# Patient Record
Sex: Male | Born: 1945 | Race: White | Hispanic: No | Marital: Married | Smoking: Former smoker
Health system: Southern US, Community
[De-identification: ages and names within clinical notes are randomized; demographics above are authoritative.]

## PROBLEM LIST (undated history)

## (undated) DIAGNOSIS — M1990 Unspecified osteoarthritis, unspecified site: Secondary | ICD-10-CM

## (undated) DIAGNOSIS — K219 Gastro-esophageal reflux disease without esophagitis: Secondary | ICD-10-CM

## (undated) DIAGNOSIS — C3490 Malignant neoplasm of unspecified part of unspecified bronchus or lung: Secondary | ICD-10-CM

## (undated) HISTORY — PX: OTHER SURGICAL HISTORY: SHX169

## (undated) HISTORY — PX: ESOPHAGOGASTRODUODENOSCOPY: SHX1529

## (undated) HISTORY — PX: COLONOSCOPY: SHX174

---

## 2009-02-01 ENCOUNTER — Observation Stay: Payer: Self-pay | Admitting: Internal Medicine

## 2011-12-09 DIAGNOSIS — C3490 Malignant neoplasm of unspecified part of unspecified bronchus or lung: Secondary | ICD-10-CM

## 2011-12-09 HISTORY — DX: Malignant neoplasm of unspecified part of unspecified bronchus or lung: C34.90

## 2012-02-03 ENCOUNTER — Other Ambulatory Visit (HOSPITAL_COMMUNITY): Payer: Self-pay | Admitting: Dentistry

## 2012-03-24 ENCOUNTER — Ambulatory Visit (HOSPITAL_COMMUNITY): Payer: Self-pay | Admitting: Dentistry

## 2012-03-25 NOTE — Progress Notes (Signed)
Appointment cancelled

## 2013-01-30 ENCOUNTER — Observation Stay: Payer: Self-pay

## 2013-01-30 LAB — URINALYSIS, COMPLETE
Bacteria: NONE SEEN
Blood: NEGATIVE
Glucose,UR: NEGATIVE mg/dL (ref 0–75)
Leukocyte Esterase: NEGATIVE
Ph: 7 (ref 4.5–8.0)
Protein: NEGATIVE
RBC,UR: <1 /HPF (ref 0–5)
Squamous Epithelial: NONE SEEN

## 2013-01-30 LAB — COMPREHENSIVE METABOLIC PANEL
Albumin: 3.5 g/dL (ref 3.4–5.0)
BUN: 8 mg/dL (ref 7–18)
Calcium, Total: 8.9 mg/dL (ref 8.5–10.1)
Chloride: 103 mmol/L (ref 98–107)
Co2: 26 mmol/L (ref 21–32)
Creatinine: 0.78 mg/dL (ref 0.60–1.30)
EGFR (African American): >60
EGFR (Non-African Amer.): >60
Glucose: 95 mg/dL (ref 65–99)
Osmolality: 270 (ref 275–301)
Potassium: 3.9 mmol/L (ref 3.5–5.1)
SGOT(AST): 26 U/L (ref 15–37)
Sodium: 136 mmol/L (ref 136–145)
Total Protein: 7.1 g/dL (ref 6.4–8.2)

## 2013-01-30 LAB — TROPONIN I: Troponin-I: <0.02 ng/mL

## 2013-01-30 LAB — CK TOTAL AND CKMB (NOT AT ARMC)
CK, Total: 78 U/L (ref 35–232)
CK-MB: <0.5 ng/mL — ABNORMAL LOW (ref 0.5–3.6)
CK-MB: <0.5 ng/mL — ABNORMAL LOW (ref 0.5–3.6)

## 2013-01-30 LAB — CBC WITH DIFFERENTIAL/PLATELET
Basophil %: 0.8 %
Eosinophil %: 3.6 %
HCT: 38.8 % — ABNORMAL LOW (ref 40.0–52.0)
Lymphocyte #: 1.2 10*3/uL (ref 1.0–3.6)
Lymphocyte %: 13.5 %
MCH: 27.8 pg (ref 26.0–34.0)
MCV: 82 fL (ref 80–100)
Monocyte %: 7.8 %
RDW: 12.7 % (ref 11.5–14.5)

## 2013-01-30 LAB — PROTIME-INR
INR: 0.9
Prothrombin Time: 12.6 secs (ref 11.5–14.7)

## 2013-01-30 LAB — APTT: Activated PTT: 30.8 secs (ref 23.6–35.9)

## 2013-01-31 LAB — LIPID PANEL
HDL Cholesterol: 37 mg/dL — ABNORMAL LOW (ref 40–60)
Ldl Cholesterol, Calc: 130 mg/dL — ABNORMAL HIGH (ref 0–100)
Triglycerides: 80 mg/dL (ref 0–200)

## 2013-01-31 LAB — CBC WITH DIFFERENTIAL/PLATELET
Basophil #: 0.1 10*3/uL (ref 0.0–0.1)
Basophil %: 0.9 %
Eosinophil %: 3.9 %
Lymphocyte %: 13.5 %
MCHC: 34.1 g/dL (ref 32.0–36.0)
MCV: 82 fL (ref 80–100)
Monocyte #: 0.7 x10 3/mm (ref 0.2–1.0)
Monocyte %: 8.7 %
Neutrophil %: 73 %
Platelet: 453 10*3/uL — ABNORMAL HIGH (ref 150–440)
RDW: 12.9 % (ref 11.5–14.5)
WBC: 8.2 10*3/uL (ref 3.8–10.6)

## 2013-01-31 LAB — TROPONIN I
Troponin-I: <0.02 ng/mL
Troponin-I: <0.02 ng/mL

## 2013-01-31 LAB — COMPREHENSIVE METABOLIC PANEL
Albumin: 3.3 g/dL — ABNORMAL LOW (ref 3.4–5.0)
Anion Gap: 7 (ref 7–16)
Bilirubin,Total: 0.6 mg/dL (ref 0.2–1.0)
Co2: 26 mmol/L (ref 21–32)
Creatinine: 0.76 mg/dL (ref 0.60–1.30)
EGFR (African American): >60
Potassium: 3.8 mmol/L (ref 3.5–5.1)
SGPT (ALT): 14 U/L (ref 12–78)
Sodium: 139 mmol/L (ref 136–145)
Total Protein: 6.8 g/dL (ref 6.4–8.2)

## 2013-02-01 ENCOUNTER — Other Ambulatory Visit (HOSPITAL_COMMUNITY): Payer: Self-pay | Admitting: Cardiothoracic Surgery

## 2013-02-01 ENCOUNTER — Ambulatory Visit: Payer: Self-pay | Admitting: Oncology

## 2013-02-01 ENCOUNTER — Other Ambulatory Visit (HOSPITAL_COMMUNITY): Payer: Self-pay | Admitting: Oncology

## 2013-02-01 DIAGNOSIS — R918 Other nonspecific abnormal finding of lung field: Secondary | ICD-10-CM

## 2013-02-02 ENCOUNTER — Other Ambulatory Visit: Payer: Self-pay | Admitting: Radiology

## 2013-02-03 ENCOUNTER — Encounter (HOSPITAL_COMMUNITY): Payer: Self-pay | Admitting: Pharmacy Technician

## 2013-02-04 ENCOUNTER — Ambulatory Visit (HOSPITAL_COMMUNITY)
Admission: RE | Admit: 2013-02-04 | Discharge: 2013-02-04 | Disposition: A | Discharge status: Home / Self Care | Payer: Medicare Other | Source: Ambulatory Visit | Attending: Cardiothoracic Surgery | Admitting: Cardiothoracic Surgery

## 2013-02-04 ENCOUNTER — Encounter (HOSPITAL_COMMUNITY): Payer: Self-pay

## 2013-02-04 ENCOUNTER — Ambulatory Visit (HOSPITAL_COMMUNITY)
Admission: RE | Admit: 2013-02-04 | Discharge: 2013-02-04 | Disposition: A | Discharge status: Home / Self Care | Payer: Medicare Other | Source: Ambulatory Visit | Attending: Interventional Radiology | Admitting: Interventional Radiology

## 2013-02-04 DIAGNOSIS — C3410 Malignant neoplasm of upper lobe, unspecified bronchus or lung: Secondary | ICD-10-CM | POA: Insufficient documentation

## 2013-02-04 DIAGNOSIS — R42 Dizziness and giddiness: Secondary | ICD-10-CM | POA: Insufficient documentation

## 2013-02-04 DIAGNOSIS — Z8673 Personal history of transient ischemic attack (TIA), and cerebral infarction without residual deficits: Secondary | ICD-10-CM | POA: Insufficient documentation

## 2013-02-04 DIAGNOSIS — I10 Essential (primary) hypertension: Secondary | ICD-10-CM | POA: Insufficient documentation

## 2013-02-04 DIAGNOSIS — J449 Chronic obstructive pulmonary disease, unspecified: Secondary | ICD-10-CM | POA: Insufficient documentation

## 2013-02-04 DIAGNOSIS — R918 Other nonspecific abnormal finding of lung field: Secondary | ICD-10-CM

## 2013-02-04 DIAGNOSIS — J4489 Other specified chronic obstructive pulmonary disease: Secondary | ICD-10-CM | POA: Insufficient documentation

## 2013-02-04 DIAGNOSIS — E119 Type 2 diabetes mellitus without complications: Secondary | ICD-10-CM | POA: Insufficient documentation

## 2013-02-04 DIAGNOSIS — R55 Syncope and collapse: Secondary | ICD-10-CM | POA: Insufficient documentation

## 2013-02-04 DIAGNOSIS — Z79899 Other long term (current) drug therapy: Secondary | ICD-10-CM | POA: Insufficient documentation

## 2013-02-04 LAB — APTT: aPTT: 37 seconds (ref 24–37)

## 2013-02-04 LAB — CBC
Hemoglobin: 12.8 g/dL — ABNORMAL LOW (ref 13.0–17.0)
MCHC: 34 g/dL (ref 30.0–36.0)
RDW: 12.4 % (ref 11.5–15.5)
WBC: 9 10*3/uL (ref 4.0–10.5)

## 2013-02-04 LAB — PROTIME-INR
INR: 0.96 (ref 0.00–1.49)
Prothrombin Time: 12.7 seconds (ref 11.6–15.2)

## 2013-02-04 MED ORDER — MIDAZOLAM HCL 2 MG/2ML IJ SOLN
INTRAMUSCULAR | Status: AC | PRN
  Administered 2013-02-04: 0.5 mg via INTRAVENOUS
  Administered 2013-02-04: 1 mg via INTRAVENOUS
  Administered 2013-02-04: 0.5 mg via INTRAVENOUS

## 2013-02-04 MED ORDER — ACETAMINOPHEN 325 MG PO TABS
650.0000 mg | ORAL_TABLET | Freq: Four times a day (QID) | ORAL | Status: DC | PRN
  Administered 2013-02-04: 650 mg via ORAL
  Filled 2013-02-04 (×2): qty 2

## 2013-02-04 MED ORDER — MIDAZOLAM HCL 2 MG/2ML IJ SOLN
INTRAMUSCULAR | Status: AC
  Filled 2013-02-04: qty 6

## 2013-02-04 MED ORDER — FENTANYL CITRATE 0.05 MG/ML IJ SOLN
INTRAMUSCULAR | Status: AC | PRN
  Administered 2013-02-04 (×2): 50 ug via INTRAVENOUS

## 2013-02-04 MED ORDER — FENTANYL CITRATE 0.05 MG/ML IJ SOLN
INTRAMUSCULAR | Status: AC
  Filled 2013-02-04: qty 6

## 2013-02-04 MED ORDER — SODIUM CHLORIDE 0.9 % IV SOLN
INTRAVENOUS | Status: DC
  Administered 2013-02-04: 10 mL/h via INTRAVENOUS

## 2013-02-04 NOTE — H&P (Signed)
Agree.  For left lung mass biopsy today.

## 2013-02-04 NOTE — Procedures (Signed)
Procedure:  CT guided left lung biopsy Findings:  CT guided 18 G core biopsy x 3 via 17 G needle of large LUL lung mass.  No PTX.
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
## 2013-02-05 ENCOUNTER — Ambulatory Visit: Payer: Self-pay | Admitting: Oncology

## 2013-02-14 ENCOUNTER — Ambulatory Visit: Payer: Self-pay | Admitting: Vascular Surgery

## 2013-02-17 LAB — COMPREHENSIVE METABOLIC PANEL
Albumin: 3.4 g/dL (ref 3.4–5.0)
BUN: 10 mg/dL (ref 7–18)
Bilirubin,Total: 0.3 mg/dL (ref 0.2–1.0)
Chloride: 98 mmol/L (ref 98–107)
Co2: 29 mmol/L (ref 21–32)
Creatinine: 1.2 mg/dL (ref 0.60–1.30)
EGFR (Non-African Amer.): >60
Glucose: 109 mg/dL — ABNORMAL HIGH (ref 65–99)
Potassium: 4 mmol/L (ref 3.5–5.1)
Sodium: 135 mmol/L — ABNORMAL LOW (ref 136–145)
Total Protein: 7.4 g/dL (ref 6.4–8.2)

## 2013-02-17 LAB — CBC CANCER CENTER
Basophil #: 0.1 x10 3/mm (ref 0.0–0.1)
Basophil %: 0.8 %
Eosinophil %: 4 %
HGB: 12.8 g/dL — ABNORMAL LOW (ref 13.0–18.0)
MCH: 27.5 pg (ref 26.0–34.0)
MCHC: 34 g/dL (ref 32.0–36.0)
MCV: 81 fL (ref 80–100)
Neutrophil #: 6.6 x10 3/mm — ABNORMAL HIGH (ref 1.4–6.5)
Neutrophil %: 74.6 %
Platelet: 464 x10 3/mm — ABNORMAL HIGH (ref 150–440)
RBC: 4.67 10*6/uL (ref 4.40–5.90)

## 2013-02-24 LAB — COMPREHENSIVE METABOLIC PANEL
Alkaline Phosphatase: 114 U/L (ref 50–136)
Anion Gap: 9 (ref 7–16)
BUN: 10 mg/dL (ref 7–18)
Chloride: 98 mmol/L (ref 98–107)
Co2: 28 mmol/L (ref 21–32)
Creatinine: 1.01 mg/dL (ref 0.60–1.30)
EGFR (African American): >60
EGFR (Non-African Amer.): >60
Osmolality: 270 (ref 275–301)
Potassium: 4.2 mmol/L (ref 3.5–5.1)
SGPT (ALT): 28 U/L (ref 12–78)

## 2013-02-24 LAB — CBC CANCER CENTER
Basophil %: 0.9 %
Eosinophil #: 0.4 x10 3/mm (ref 0.0–0.7)
HGB: 12.7 g/dL — ABNORMAL LOW (ref 13.0–18.0)
Lymphocyte #: 1.1 x10 3/mm (ref 1.0–3.6)
Lymphocyte %: 13.1 %
MCHC: 33.4 g/dL (ref 32.0–36.0)
MCV: 82 fL (ref 80–100)
Monocyte #: 0.5 x10 3/mm (ref 0.2–1.0)
Monocyte %: 6.1 %
Neutrophil %: 75.3 %
Platelet: 473 x10 3/mm — ABNORMAL HIGH (ref 150–440)
RBC: 4.67 10*6/uL (ref 4.40–5.90)
RDW: 12.6 % (ref 11.5–14.5)

## 2013-03-03 LAB — COMPREHENSIVE METABOLIC PANEL
Albumin: 3.3 g/dL — ABNORMAL LOW (ref 3.4–5.0)
Alkaline Phosphatase: 101 U/L (ref 50–136)
Anion Gap: 7 (ref 7–16)
BUN: 11 mg/dL (ref 7–18)
Bilirubin,Total: 0.4 mg/dL (ref 0.2–1.0)
Calcium, Total: 8.6 mg/dL (ref 8.5–10.1)
Chloride: 101 mmol/L (ref 98–107)
Co2: 27 mmol/L (ref 21–32)
Creatinine: 1.02 mg/dL (ref 0.60–1.30)
EGFR (African American): >60
EGFR (Non-African Amer.): >60
Glucose: 124 mg/dL — ABNORMAL HIGH (ref 65–99)
Osmolality: 271 (ref 275–301)
Potassium: 3.6 mmol/L (ref 3.5–5.1)
SGOT(AST): 19 U/L (ref 15–37)
SGPT (ALT): 28 U/L (ref 12–78)
Sodium: 135 mmol/L — ABNORMAL LOW (ref 136–145)
Total Protein: 6.6 g/dL (ref 6.4–8.2)

## 2013-03-03 LAB — CBC CANCER CENTER
Basophil #: 0.1 x10 3/mm (ref 0.0–0.1)
Basophil %: 1 %
Eosinophil #: 0.1 x10 3/mm (ref 0.0–0.7)
Eosinophil %: 2.4 %
HCT: 34.6 % — ABNORMAL LOW (ref 40.0–52.0)
HGB: 11.9 g/dL — ABNORMAL LOW (ref 13.0–18.0)
Lymphocyte #: 0.8 x10 3/mm — ABNORMAL LOW (ref 1.0–3.6)
Lymphocyte %: 13.1 %
MCH: 27.9 pg (ref 26.0–34.0)
MCHC: 34.4 g/dL (ref 32.0–36.0)
MCV: 81 fL (ref 80–100)
Monocyte #: 0.4 x10 3/mm (ref 0.2–1.0)
Monocyte %: 6.4 %
Neutrophil #: 4.7 x10 3/mm (ref 1.4–6.5)
Neutrophil %: 77.1 %
Platelet: 386 x10 3/mm (ref 150–440)
RBC: 4.26 10*6/uL — ABNORMAL LOW (ref 4.40–5.90)
RDW: 12.9 % (ref 11.5–14.5)
WBC: 6 x10 3/mm (ref 3.8–10.6)

## 2013-03-08 ENCOUNTER — Ambulatory Visit: Payer: Self-pay | Admitting: Oncology

## 2013-03-10 LAB — COMPREHENSIVE METABOLIC PANEL
Albumin: 3.3 g/dL — ABNORMAL LOW (ref 3.4–5.0)
Anion Gap: 9 (ref 7–16)
BUN: 9 mg/dL (ref 7–18)
Chloride: 99 mmol/L (ref 98–107)
Co2: 28 mmol/L (ref 21–32)
EGFR (Non-African Amer.): >60
Glucose: 109 mg/dL — ABNORMAL HIGH (ref 65–99)
Osmolality: 271 (ref 275–301)
Potassium: 4 mmol/L (ref 3.5–5.1)
SGOT(AST): 22 U/L (ref 15–37)
Sodium: 136 mmol/L (ref 136–145)
Total Protein: 6.6 g/dL (ref 6.4–8.2)

## 2013-03-10 LAB — CBC CANCER CENTER
Basophil #: 0 x10 3/mm (ref 0.0–0.1)
Eosinophil #: 0.1 x10 3/mm (ref 0.0–0.7)
HGB: 11.8 g/dL — ABNORMAL LOW (ref 13.0–18.0)
Lymphocyte #: 0.5 x10 3/mm — ABNORMAL LOW (ref 1.0–3.6)
MCHC: 33 g/dL (ref 32.0–36.0)
MCV: 83 fL (ref 80–100)
Neutrophil #: 2.6 x10 3/mm (ref 1.4–6.5)
Neutrophil %: 73.8 %
Platelet: 360 x10 3/mm (ref 150–440)
RBC: 4.33 10*6/uL — ABNORMAL LOW (ref 4.40–5.90)
RDW: 13.5 % (ref 11.5–14.5)
WBC: 3.5 x10 3/mm — ABNORMAL LOW (ref 3.8–10.6)

## 2013-03-17 LAB — COMPREHENSIVE METABOLIC PANEL
Alkaline Phosphatase: 114 U/L (ref 50–136)
Anion Gap: 6 — ABNORMAL LOW (ref 7–16)
BUN: 11 mg/dL (ref 7–18)
Bilirubin,Total: 0.1 mg/dL — ABNORMAL LOW (ref 0.2–1.0)
Calcium, Total: 8.8 mg/dL (ref 8.5–10.1)
Chloride: 100 mmol/L (ref 98–107)
Co2: 29 mmol/L (ref 21–32)
Creatinine: 1.05 mg/dL (ref 0.60–1.30)
EGFR (Non-African Amer.): >60
Glucose: 104 mg/dL — ABNORMAL HIGH (ref 65–99)
Potassium: 4.2 mmol/L (ref 3.5–5.1)
SGOT(AST): 15 U/L (ref 15–37)

## 2013-03-17 LAB — CBC CANCER CENTER
Basophil #: 0 x10 3/mm (ref 0.0–0.1)
Basophil %: 1.3 %
Eosinophil #: 0.1 x10 3/mm (ref 0.0–0.7)
Eosinophil %: 2.2 %
HCT: 35.7 % — ABNORMAL LOW (ref 40.0–52.0)
HGB: 12 g/dL — ABNORMAL LOW (ref 13.0–18.0)
Lymphocyte #: 0.4 x10 3/mm — ABNORMAL LOW (ref 1.0–3.6)
MCH: 27.4 pg (ref 26.0–34.0)
MCHC: 33.5 g/dL (ref 32.0–36.0)
MCV: 82 fL (ref 80–100)
Monocyte #: 0.3 x10 3/mm (ref 0.2–1.0)
Neutrophil #: 2.7 x10 3/mm (ref 1.4–6.5)
Neutrophil %: 76.6 %
Platelet: 287 x10 3/mm (ref 150–440)
WBC: 3.5 x10 3/mm — ABNORMAL LOW (ref 3.8–10.6)

## 2013-03-24 LAB — CBC CANCER CENTER
Basophil #: 0 x10 3/mm (ref 0.0–0.1)
Eosinophil #: 0.1 x10 3/mm (ref 0.0–0.7)
Eosinophil %: 4.2 %
HCT: 33.4 % — ABNORMAL LOW (ref 40.0–52.0)
HGB: 11.5 g/dL — ABNORMAL LOW (ref 13.0–18.0)
Lymphocyte #: 0.3 x10 3/mm — ABNORMAL LOW (ref 1.0–3.6)
Lymphocyte %: 8.3 %
MCH: 28.2 pg (ref 26.0–34.0)
MCHC: 34.3 g/dL (ref 32.0–36.0)
Monocyte #: 0.3 x10 3/mm (ref 0.2–1.0)
Monocyte %: 8.6 %
Neutrophil #: 2.6 x10 3/mm (ref 1.4–6.5)
Neutrophil %: 77.8 %
RBC: 4.07 10*6/uL — ABNORMAL LOW (ref 4.40–5.90)
RDW: 14.3 % (ref 11.5–14.5)

## 2013-03-24 LAB — COMPREHENSIVE METABOLIC PANEL
Albumin: 3.5 g/dL (ref 3.4–5.0)
Alkaline Phosphatase: 98 U/L (ref 50–136)
Anion Gap: 8 (ref 7–16)
Bilirubin,Total: 0.4 mg/dL (ref 0.2–1.0)
Chloride: 101 mmol/L (ref 98–107)
Co2: 29 mmol/L (ref 21–32)
EGFR (African American): >60
Potassium: 4.1 mmol/L (ref 3.5–5.1)
SGPT (ALT): 25 U/L (ref 12–78)

## 2013-03-31 LAB — CBC CANCER CENTER
HGB: 11.5 g/dL — ABNORMAL LOW (ref 13.0–18.0)
Lymphocyte %: 12.8 %
MCH: 28.3 pg (ref 26.0–34.0)
MCHC: 33.9 g/dL (ref 32.0–36.0)
Monocyte #: 0.3 x10 3/mm (ref 0.2–1.0)
Platelet: 184 x10 3/mm (ref 150–440)

## 2013-03-31 LAB — COMPREHENSIVE METABOLIC PANEL
Anion Gap: 4 — ABNORMAL LOW (ref 7–16)
Bilirubin,Total: 0.4 mg/dL (ref 0.2–1.0)
Creatinine: 0.83 mg/dL (ref 0.60–1.30)
EGFR (African American): >60
EGFR (Non-African Amer.): >60
Glucose: 97 mg/dL (ref 65–99)
Osmolality: 269 (ref 275–301)
Potassium: 4 mmol/L (ref 3.5–5.1)
SGOT(AST): 16 U/L (ref 15–37)
SGPT (ALT): 21 U/L (ref 12–78)
Sodium: 135 mmol/L — ABNORMAL LOW (ref 136–145)
Total Protein: 6.5 g/dL (ref 6.4–8.2)

## 2013-04-07 ENCOUNTER — Ambulatory Visit: Payer: Self-pay | Admitting: Oncology

## 2013-04-07 LAB — CBC CANCER CENTER
Basophil #: 0 x10 3/mm (ref 0.0–0.1)
Basophil %: 0.6 %
HCT: 32.9 % — ABNORMAL LOW (ref 40.0–52.0)
HGB: 11.1 g/dL — ABNORMAL LOW (ref 13.0–18.0)
Lymphocyte #: 0.3 x10 3/mm — ABNORMAL LOW (ref 1.0–3.6)
MCH: 28.2 pg (ref 26.0–34.0)
MCHC: 33.7 g/dL (ref 32.0–36.0)
MCV: 84 fL (ref 80–100)
Monocyte #: 0.3 x10 3/mm (ref 0.2–1.0)
Platelet: 254 x10 3/mm (ref 150–440)
RBC: 3.92 10*6/uL — ABNORMAL LOW (ref 4.40–5.90)
RDW: 17.4 % — ABNORMAL HIGH (ref 11.5–14.5)
WBC: 2.3 x10 3/mm — ABNORMAL LOW (ref 3.8–10.6)

## 2013-04-07 LAB — COMPREHENSIVE METABOLIC PANEL
Albumin: 3.4 g/dL (ref 3.4–5.0)
Anion Gap: 14 (ref 7–16)
Bilirubin,Total: 0.4 mg/dL (ref 0.2–1.0)
Calcium, Total: 9.1 mg/dL (ref 8.5–10.1)
EGFR (African American): >60
EGFR (Non-African Amer.): >60
Glucose: 93 mg/dL (ref 65–99)
Potassium: 4.1 mmol/L (ref 3.5–5.1)
SGOT(AST): 13 U/L — ABNORMAL LOW (ref 15–37)
Sodium: 137 mmol/L (ref 136–145)

## 2013-04-28 ENCOUNTER — Emergency Department: Payer: Self-pay | Admitting: Emergency Medicine

## 2013-04-28 LAB — URINALYSIS, COMPLETE
Bacteria: NONE SEEN
Bilirubin,UR: NEGATIVE
Blood: NEGATIVE
Glucose,UR: NEGATIVE mg/dL (ref 0–75)
Ketone: NEGATIVE
Nitrite: NEGATIVE
Protein: NEGATIVE
RBC,UR: NONE SEEN /HPF (ref 0–5)
Specific Gravity: 1.006 (ref 1.003–1.030)
WBC UR: 1 /HPF (ref 0–5)

## 2013-04-28 LAB — COMPREHENSIVE METABOLIC PANEL
Albumin: 3.1 g/dL — ABNORMAL LOW (ref 3.4–5.0)
Alkaline Phosphatase: 109 U/L (ref 50–136)
Anion Gap: 5 — ABNORMAL LOW (ref 7–16)
BUN: 7 mg/dL (ref 7–18)
Bilirubin,Total: 0.4 mg/dL (ref 0.2–1.0)
Chloride: 101 mmol/L (ref 98–107)
Co2: 27 mmol/L (ref 21–32)
EGFR (African American): >60
EGFR (Non-African Amer.): >60
Glucose: 153 mg/dL — ABNORMAL HIGH (ref 65–99)
Osmolality: 267 (ref 275–301)
Potassium: 3.6 mmol/L (ref 3.5–5.1)
SGPT (ALT): 32 U/L (ref 12–78)
Total Protein: 7 g/dL (ref 6.4–8.2)

## 2013-04-28 LAB — CBC WITH DIFFERENTIAL/PLATELET
Basophil #: 0.1 10*3/uL (ref 0.0–0.1)
Basophil %: 1 %
Eosinophil #: 0.4 10*3/uL (ref 0.0–0.7)
HCT: 30.7 % — ABNORMAL LOW (ref 40.0–52.0)
Lymphocyte #: 0.5 10*3/uL — ABNORMAL LOW (ref 1.0–3.6)
Lymphocyte %: 8.8 %
MCH: 29.8 pg (ref 26.0–34.0)
MCHC: 35 g/dL (ref 32.0–36.0)
MCV: 85 fL (ref 80–100)
Monocyte #: 1.1 x10 3/mm — ABNORMAL HIGH (ref 0.2–1.0)
Monocyte %: 18.2 %
Neutrophil #: 3.8 10*3/uL (ref 1.4–6.5)
Neutrophil %: 65.9 %
Platelet: 307 10*3/uL (ref 150–440)
RDW: 20.2 % — ABNORMAL HIGH (ref 11.5–14.5)

## 2013-05-03 LAB — CULTURE, BLOOD (SINGLE)

## 2013-05-08 ENCOUNTER — Ambulatory Visit: Payer: Self-pay | Admitting: Oncology

## 2013-05-23 LAB — CBC CANCER CENTER
Basophil %: 0.7 %
HCT: 32.9 % — ABNORMAL LOW (ref 40.0–52.0)
HGB: 11.2 g/dL — ABNORMAL LOW (ref 13.0–18.0)
Lymphocyte %: 7.9 %
MCHC: 34.1 g/dL (ref 32.0–36.0)
MCV: 87 fL (ref 80–100)
Monocyte %: 13.1 %
Neutrophil #: 3.4 x10 3/mm (ref 1.4–6.5)
Platelet: 350 x10 3/mm (ref 150–440)
RDW: 16.4 % — ABNORMAL HIGH (ref 11.5–14.5)
WBC: 4.8 x10 3/mm (ref 3.8–10.6)

## 2013-05-23 LAB — COMPREHENSIVE METABOLIC PANEL
Albumin: 3 g/dL — ABNORMAL LOW (ref 3.4–5.0)
Anion Gap: 6 — ABNORMAL LOW (ref 7–16)
BUN: 11 mg/dL (ref 7–18)
Chloride: 100 mmol/L (ref 98–107)
EGFR (Non-African Amer.): >60
Potassium: 3.6 mmol/L (ref 3.5–5.1)
SGOT(AST): 14 U/L — ABNORMAL LOW (ref 15–37)
Sodium: 135 mmol/L — ABNORMAL LOW (ref 136–145)

## 2013-06-07 ENCOUNTER — Ambulatory Visit: Payer: Self-pay | Admitting: Oncology

## 2013-07-04 LAB — COMPREHENSIVE METABOLIC PANEL
Alkaline Phosphatase: 96 U/L (ref 50–136)
BUN: 6 mg/dL — ABNORMAL LOW (ref 7–18)
Chloride: 105 mmol/L (ref 98–107)
Co2: 30 mmol/L (ref 21–32)
Creatinine: 0.89 mg/dL (ref 0.60–1.30)
EGFR (African American): >60
Glucose: 86 mg/dL (ref 65–99)
SGOT(AST): 18 U/L (ref 15–37)
SGPT (ALT): 22 U/L (ref 12–78)
Sodium: 140 mmol/L (ref 136–145)

## 2013-07-04 LAB — CBC CANCER CENTER
Basophil #: 0 x10 3/mm (ref 0.0–0.1)
Basophil %: 0.7 %
Eosinophil %: 9 %
Lymphocyte #: 0.5 x10 3/mm — ABNORMAL LOW (ref 1.0–3.6)
MCH: 28.5 pg (ref 26.0–34.0)
MCV: 83 fL (ref 80–100)
Monocyte %: 12 %
WBC: 3.9 x10 3/mm (ref 3.8–10.6)

## 2013-07-08 ENCOUNTER — Ambulatory Visit: Payer: Self-pay | Admitting: Oncology

## 2013-08-05 LAB — COMPREHENSIVE METABOLIC PANEL
Alkaline Phosphatase: 95 U/L (ref 50–136)
Anion Gap: 10 (ref 7–16)
Bilirubin,Total: 0.8 mg/dL (ref 0.2–1.0)
Chloride: 101 mmol/L (ref 98–107)
Co2: 28 mmol/L (ref 21–32)
Creatinine: 0.94 mg/dL (ref 0.60–1.30)
EGFR (African American): >60
EGFR (Non-African Amer.): >60
Glucose: 107 mg/dL — ABNORMAL HIGH (ref 65–99)
Osmolality: 276 (ref 275–301)
Sodium: 139 mmol/L (ref 136–145)
Total Protein: 6.5 g/dL (ref 6.4–8.2)

## 2013-08-05 LAB — CBC CANCER CENTER
Basophil #: 0.1 x10 3/mm (ref 0.0–0.1)
HCT: 40.5 % (ref 40.0–52.0)
MCH: 27.2 pg (ref 26.0–34.0)
MCV: 81 fL (ref 80–100)
Monocyte %: 9.1 %
Neutrophil %: 65.7 %
RBC: 5 10*6/uL (ref 4.40–5.90)

## 2013-08-08 ENCOUNTER — Ambulatory Visit: Payer: Self-pay | Admitting: Oncology

## 2013-09-05 LAB — CBC CANCER CENTER
Basophil %: 0.8 %
HGB: 14.7 g/dL (ref 13.0–18.0)
Lymphocyte #: 0.6 x10 3/mm — ABNORMAL LOW (ref 1.0–3.6)
Lymphocyte %: 9.8 %
MCV: 81 fL (ref 80–100)
WBC: 6.5 x10 3/mm (ref 3.8–10.6)

## 2013-09-05 LAB — COMPREHENSIVE METABOLIC PANEL
Alkaline Phosphatase: 93 U/L (ref 50–136)
Calcium, Total: 9.2 mg/dL (ref 8.5–10.1)
Chloride: 103 mmol/L (ref 98–107)
Co2: 32 mmol/L (ref 21–32)
EGFR (Non-African Amer.): >60
Glucose: 99 mg/dL (ref 65–99)
Sodium: 137 mmol/L (ref 136–145)
Total Protein: 7.2 g/dL (ref 6.4–8.2)

## 2013-09-05 LAB — MAGNESIUM: Magnesium: 1.9 mg/dL

## 2013-09-05 LAB — PHOSPHORUS: Phosphorus: 3.1 mg/dL (ref 2.5–4.9)

## 2013-09-07 ENCOUNTER — Ambulatory Visit: Payer: Self-pay | Admitting: Oncology

## 2013-10-06 LAB — CBC CANCER CENTER
Basophil #: 0.1 x10 3/mm (ref 0.0–0.1)
Eosinophil #: 1 x10 3/mm — ABNORMAL HIGH (ref 0.0–0.7)
HGB: 14.1 g/dL (ref 13.0–18.0)
Lymphocyte #: 0.5 x10 3/mm — ABNORMAL LOW (ref 1.0–3.6)
Lymphocyte %: 8.3 %
MCHC: 33.3 g/dL (ref 32.0–36.0)
MCV: 84 fL (ref 80–100)
Monocyte #: 0.5 x10 3/mm (ref 0.2–1.0)
Monocyte %: 8.3 %
Platelet: 308 x10 3/mm (ref 150–440)
RBC: 5.03 10*6/uL (ref 4.40–5.90)
WBC: 6.5 x10 3/mm (ref 3.8–10.6)

## 2013-10-06 LAB — COMPREHENSIVE METABOLIC PANEL
Albumin: 3.6 g/dL (ref 3.4–5.0)
Alkaline Phosphatase: 92 U/L (ref 50–136)
Bilirubin,Total: 0.6 mg/dL (ref 0.2–1.0)
Chloride: 103 mmol/L (ref 98–107)
EGFR (African American): >60
Osmolality: 276 (ref 275–301)
SGOT(AST): 22 U/L (ref 15–37)
SGPT (ALT): 30 U/L (ref 12–78)
Sodium: 139 mmol/L (ref 136–145)

## 2013-10-06 LAB — MAGNESIUM: Magnesium: 2 mg/dL

## 2013-10-08 ENCOUNTER — Ambulatory Visit: Payer: Self-pay | Admitting: Oncology

## 2013-11-08 ENCOUNTER — Ambulatory Visit: Payer: Self-pay | Admitting: Oncology

## 2013-11-08 LAB — CBC CANCER CENTER
HCT: 40.9 % (ref 40.0–52.0)
Lymphocyte %: 9.9 %
MCH: 28.7 pg (ref 26.0–34.0)
MCHC: 33.9 g/dL (ref 32.0–36.0)
MCV: 85 fL (ref 80–100)
Platelet: 307 x10 3/mm (ref 150–440)
RDW: 13.9 % (ref 11.5–14.5)
WBC: 5.9 x10 3/mm (ref 3.8–10.6)

## 2013-11-08 LAB — COMPREHENSIVE METABOLIC PANEL
Albumin: 3.5 g/dL (ref 3.4–5.0)
Anion Gap: 5 — ABNORMAL LOW (ref 7–16)
BUN: 9 mg/dL (ref 7–18)
Chloride: 104 mmol/L (ref 98–107)
Creatinine: 0.91 mg/dL (ref 0.60–1.30)
EGFR (Non-African Amer.): >60
Potassium: 3.6 mmol/L (ref 3.5–5.1)
Sodium: 139 mmol/L (ref 136–145)
Total Protein: 6.5 g/dL (ref 6.4–8.2)

## 2013-12-08 ENCOUNTER — Ambulatory Visit: Payer: Self-pay | Admitting: Oncology

## 2013-12-22 LAB — COMPREHENSIVE METABOLIC PANEL
ALBUMIN: 3.7 g/dL (ref 3.4–5.0)
AST: 26 U/L (ref 15–37)
Alkaline Phosphatase: 96 U/L
Anion Gap: 8 (ref 7–16)
BUN: 10 mg/dL (ref 7–18)
Bilirubin,Total: 0.8 mg/dL (ref 0.2–1.0)
CALCIUM: 8.5 mg/dL (ref 8.5–10.1)
Chloride: 102 mmol/L (ref 98–107)
Co2: 29 mmol/L (ref 21–32)
Creatinine: 1.01 mg/dL (ref 0.60–1.30)
EGFR (African American): >60
GLUCOSE: 96 mg/dL (ref 65–99)
Osmolality: 276 (ref 275–301)
Potassium: 4 mmol/L (ref 3.5–5.1)
SGPT (ALT): 34 U/L (ref 12–78)
Sodium: 139 mmol/L (ref 136–145)
Total Protein: 6.8 g/dL (ref 6.4–8.2)

## 2013-12-22 LAB — CBC CANCER CENTER
BASOS ABS: 0 x10 3/mm (ref 0.0–0.1)
BASOS PCT: 0.7 %
EOS ABS: 0.5 x10 3/mm (ref 0.0–0.7)
Eosinophil %: 7.5 %
HCT: 42.6 % (ref 40.0–52.0)
HGB: 14.2 g/dL (ref 13.0–18.0)
LYMPHS PCT: 9.1 %
Lymphocyte #: 0.6 x10 3/mm — ABNORMAL LOW (ref 1.0–3.6)
MCH: 28.5 pg (ref 26.0–34.0)
MCHC: 33.4 g/dL (ref 32.0–36.0)
MCV: 86 fL (ref 80–100)
MONOS PCT: 8.7 %
Monocyte #: 0.6 x10 3/mm (ref 0.2–1.0)
NEUTROS PCT: 74 %
Neutrophil #: 4.8 x10 3/mm (ref 1.4–6.5)
PLATELETS: 325 x10 3/mm (ref 150–440)
RBC: 4.99 10*6/uL (ref 4.40–5.90)
RDW: 13.3 % (ref 11.5–14.5)
WBC: 6.4 x10 3/mm (ref 3.8–10.6)

## 2013-12-22 LAB — PHOSPHORUS: Phosphorus: 3.2 mg/dL (ref 2.5–4.9)

## 2013-12-22 LAB — MAGNESIUM: Magnesium: 2.1 mg/dL

## 2014-01-08 ENCOUNTER — Ambulatory Visit: Payer: Self-pay | Admitting: Oncology

## 2014-02-08 ENCOUNTER — Ambulatory Visit: Payer: Self-pay | Admitting: Oncology

## 2014-02-08 LAB — CBC CANCER CENTER
BASOS ABS: 0.1 x10 3/mm (ref 0.0–0.1)
Basophil %: 0.9 %
Eosinophil #: 0.3 x10 3/mm (ref 0.0–0.7)
Eosinophil %: 5.2 %
HCT: 41.3 % (ref 40.0–52.0)
HGB: 13.8 g/dL (ref 13.0–18.0)
LYMPHS ABS: 0.6 x10 3/mm — AB (ref 1.0–3.6)
Lymphocyte %: 9.4 %
MCH: 28.7 pg (ref 26.0–34.0)
MCHC: 33.3 g/dL (ref 32.0–36.0)
MCV: 86 fL (ref 80–100)
MONOS PCT: 7.8 %
Monocyte #: 0.5 x10 3/mm (ref 0.2–1.0)
NEUTROS PCT: 76.7 %
Neutrophil #: 4.7 x10 3/mm (ref 1.4–6.5)
PLATELETS: 325 x10 3/mm (ref 150–440)
RBC: 4.8 10*6/uL (ref 4.40–5.90)
RDW: 12.9 % (ref 11.5–14.5)
WBC: 6.2 x10 3/mm (ref 3.8–10.6)

## 2014-02-08 LAB — COMPREHENSIVE METABOLIC PANEL
Albumin: 3.6 g/dL (ref 3.4–5.0)
Alkaline Phosphatase: 101 U/L
Anion Gap: 8 (ref 7–16)
BILIRUBIN TOTAL: 0.5 mg/dL (ref 0.2–1.0)
BUN: 7 mg/dL (ref 7–18)
CHLORIDE: 102 mmol/L (ref 98–107)
CO2: 28 mmol/L (ref 21–32)
Calcium, Total: 8.8 mg/dL (ref 8.5–10.1)
Creatinine: 0.97 mg/dL (ref 0.60–1.30)
EGFR (African American): >60
EGFR (Non-African Amer.): >60
Glucose: 97 mg/dL (ref 65–99)
Osmolality: 274 (ref 275–301)
POTASSIUM: 3.9 mmol/L (ref 3.5–5.1)
SGOT(AST): 21 U/L (ref 15–37)
SGPT (ALT): 28 U/L (ref 12–78)
SODIUM: 138 mmol/L (ref 136–145)
TOTAL PROTEIN: 6.7 g/dL (ref 6.4–8.2)

## 2014-02-08 LAB — MAGNESIUM: Magnesium: 2.2 mg/dL

## 2014-02-08 LAB — PHOSPHORUS: PHOSPHORUS: 3.4 mg/dL (ref 2.5–4.9)

## 2014-03-08 ENCOUNTER — Ambulatory Visit: Payer: Self-pay | Admitting: Oncology

## 2014-03-24 LAB — CBC CANCER CENTER
Basophil #: 0.1 x10 3/mm (ref 0.0–0.1)
Basophil %: 1 %
Eosinophil #: 0.3 x10 3/mm (ref 0.0–0.7)
Eosinophil %: 5.5 %
HCT: 41.9 % (ref 40.0–52.0)
HGB: 13.6 g/dL (ref 13.0–18.0)
LYMPHS ABS: 0.5 x10 3/mm — AB (ref 1.0–3.6)
Lymphocyte %: 10.5 %
MCH: 28.1 pg (ref 26.0–34.0)
MCHC: 32.5 g/dL (ref 32.0–36.0)
MCV: 87 fL (ref 80–100)
MONOS PCT: 9.4 %
Monocyte #: 0.5 x10 3/mm (ref 0.2–1.0)
NEUTROS PCT: 73.6 %
Neutrophil #: 3.9 x10 3/mm (ref 1.4–6.5)
Platelet: 289 x10 3/mm (ref 150–440)
RBC: 4.84 10*6/uL (ref 4.40–5.90)
RDW: 13.5 % (ref 11.5–14.5)
WBC: 5.2 x10 3/mm (ref 3.8–10.6)

## 2014-03-24 LAB — COMPREHENSIVE METABOLIC PANEL
ALBUMIN: 3.5 g/dL (ref 3.4–5.0)
ALK PHOS: 89 U/L
Anion Gap: 5 — ABNORMAL LOW (ref 7–16)
BUN: 7 mg/dL (ref 7–18)
Bilirubin,Total: 0.5 mg/dL (ref 0.2–1.0)
Calcium, Total: 9 mg/dL (ref 8.5–10.1)
Chloride: 105 mmol/L (ref 98–107)
Co2: 31 mmol/L (ref 21–32)
Creatinine: 0.88 mg/dL (ref 0.60–1.30)
EGFR (Non-African Amer.): >60
Glucose: 91 mg/dL (ref 65–99)
Osmolality: 279 (ref 275–301)
POTASSIUM: 4 mmol/L (ref 3.5–5.1)
SGOT(AST): 16 U/L (ref 15–37)
SGPT (ALT): 23 U/L (ref 12–78)
Sodium: 141 mmol/L (ref 136–145)
TOTAL PROTEIN: 6.6 g/dL (ref 6.4–8.2)

## 2014-03-24 LAB — MAGNESIUM: MAGNESIUM: 2 mg/dL

## 2014-04-07 ENCOUNTER — Ambulatory Visit: Payer: Self-pay | Admitting: Oncology

## 2014-05-08 ENCOUNTER — Ambulatory Visit: Payer: Self-pay | Admitting: Oncology

## 2014-05-08 LAB — CBC CANCER CENTER
Basophil #: 0 x10 3/mm (ref 0.0–0.1)
Basophil %: 1 %
EOS ABS: 0.2 x10 3/mm (ref 0.0–0.7)
Eosinophil %: 6.1 %
HCT: 39.1 % — AB (ref 40.0–52.0)
HGB: 13.2 g/dL (ref 13.0–18.0)
Lymphocyte #: 0.6 x10 3/mm — ABNORMAL LOW (ref 1.0–3.6)
Lymphocyte %: 13.8 %
MCH: 29.2 pg (ref 26.0–34.0)
MCHC: 33.9 g/dL (ref 32.0–36.0)
MCV: 86 fL (ref 80–100)
MONO ABS: 0.4 x10 3/mm (ref 0.2–1.0)
Monocyte %: 10.5 %
NEUTROS ABS: 2.8 x10 3/mm (ref 1.4–6.5)
Neutrophil %: 68.6 %
PLATELETS: 296 x10 3/mm (ref 150–440)
RBC: 4.54 10*6/uL (ref 4.40–5.90)
RDW: 13.2 % (ref 11.5–14.5)
WBC: 4 x10 3/mm (ref 3.8–10.6)

## 2014-05-08 LAB — COMPREHENSIVE METABOLIC PANEL
ALBUMIN: 3.6 g/dL (ref 3.4–5.0)
ANION GAP: 8 (ref 7–16)
Alkaline Phosphatase: 89 U/L
BUN: 8 mg/dL (ref 7–18)
Bilirubin,Total: 0.7 mg/dL (ref 0.2–1.0)
Calcium, Total: 8.8 mg/dL (ref 8.5–10.1)
Chloride: 104 mmol/L (ref 98–107)
Co2: 29 mmol/L (ref 21–32)
Creatinine: 0.91 mg/dL (ref 0.60–1.30)
EGFR (African American): >60
EGFR (Non-African Amer.): >60
Glucose: 91 mg/dL (ref 65–99)
Osmolality: 279 (ref 275–301)
Potassium: 4 mmol/L (ref 3.5–5.1)
SGOT(AST): 21 U/L (ref 15–37)
SGPT (ALT): 29 U/L (ref 12–78)
SODIUM: 141 mmol/L (ref 136–145)
TOTAL PROTEIN: 6.5 g/dL (ref 6.4–8.2)

## 2014-05-08 LAB — MAGNESIUM: Magnesium: 2 mg/dL

## 2014-05-08 LAB — PHOSPHORUS: PHOSPHORUS: 2.9 mg/dL (ref 2.5–4.9)

## 2014-06-07 ENCOUNTER — Ambulatory Visit: Payer: Self-pay | Admitting: Oncology

## 2014-07-08 ENCOUNTER — Ambulatory Visit: Payer: Self-pay | Admitting: Oncology

## 2014-08-07 ENCOUNTER — Ambulatory Visit: Payer: Self-pay | Admitting: Gastroenterology

## 2014-08-08 ENCOUNTER — Ambulatory Visit: Payer: Self-pay | Admitting: Oncology

## 2014-08-22 ENCOUNTER — Ambulatory Visit: Payer: Self-pay | Admitting: Gastroenterology

## 2014-09-13 ENCOUNTER — Ambulatory Visit: Payer: Self-pay | Admitting: Oncology

## 2014-09-13 LAB — COMPREHENSIVE METABOLIC PANEL
ALBUMIN: 3.7 g/dL (ref 3.4–5.0)
AST: 24 U/L (ref 15–37)
Alkaline Phosphatase: 87 U/L
Anion Gap: 7 (ref 7–16)
BUN: 7 mg/dL (ref 7–18)
Bilirubin,Total: 0.5 mg/dL (ref 0.2–1.0)
CALCIUM: 8.8 mg/dL (ref 8.5–10.1)
CO2: 27 mmol/L (ref 21–32)
Chloride: 103 mmol/L (ref 98–107)
Creatinine: 0.87 mg/dL (ref 0.60–1.30)
EGFR (African American): >60
EGFR (Non-African Amer.): >60
Glucose: 101 mg/dL — ABNORMAL HIGH (ref 65–99)
OSMOLALITY: 272 (ref 275–301)
Potassium: 3.8 mmol/L (ref 3.5–5.1)
SGPT (ALT): 37 U/L
Sodium: 137 mmol/L (ref 136–145)
Total Protein: 6.5 g/dL (ref 6.4–8.2)

## 2014-09-13 LAB — CBC CANCER CENTER
Basophil #: 0 x10 3/mm (ref 0.0–0.1)
Basophil %: 0.9 %
EOS ABS: 0.2 x10 3/mm (ref 0.0–0.7)
Eosinophil %: 3.3 %
HCT: 42.7 % (ref 40.0–52.0)
HGB: 14.2 g/dL (ref 13.0–18.0)
LYMPHS ABS: 0.6 x10 3/mm — AB (ref 1.0–3.6)
Lymphocyte %: 11.5 %
MCH: 28.6 pg (ref 26.0–34.0)
MCHC: 33.3 g/dL (ref 32.0–36.0)
MCV: 86 fL (ref 80–100)
Monocyte #: 0.5 x10 3/mm (ref 0.2–1.0)
Monocyte %: 8.8 %
Neutrophil #: 4 x10 3/mm (ref 1.4–6.5)
Neutrophil %: 75.5 %
PLATELETS: 333 x10 3/mm (ref 150–440)
RBC: 4.97 10*6/uL (ref 4.40–5.90)
RDW: 13.2 % (ref 11.5–14.5)
WBC: 5.3 x10 3/mm (ref 3.8–10.6)

## 2014-09-13 LAB — PHOSPHORUS: Phosphorus: 3.1 mg/dL (ref 2.5–4.9)

## 2014-10-08 ENCOUNTER — Ambulatory Visit: Payer: Self-pay | Admitting: Oncology

## 2014-11-07 ENCOUNTER — Ambulatory Visit: Payer: Self-pay | Admitting: Oncology

## 2014-12-08 ENCOUNTER — Ambulatory Visit: Payer: Self-pay | Admitting: Oncology

## 2014-12-13 DIAGNOSIS — Z720 Tobacco use: Secondary | ICD-10-CM | POA: Diagnosis not present

## 2014-12-13 DIAGNOSIS — Z9221 Personal history of antineoplastic chemotherapy: Secondary | ICD-10-CM | POA: Diagnosis not present

## 2014-12-13 DIAGNOSIS — K8020 Calculus of gallbladder without cholecystitis without obstruction: Secondary | ICD-10-CM | POA: Diagnosis not present

## 2014-12-13 DIAGNOSIS — C3490 Malignant neoplasm of unspecified part of unspecified bronchus or lung: Secondary | ICD-10-CM | POA: Diagnosis not present

## 2014-12-13 DIAGNOSIS — N62 Hypertrophy of breast: Secondary | ICD-10-CM | POA: Diagnosis not present

## 2014-12-13 DIAGNOSIS — J701 Chronic and other pulmonary manifestations due to radiation: Secondary | ICD-10-CM | POA: Diagnosis not present

## 2014-12-13 DIAGNOSIS — T451X5S Adverse effect of antineoplastic and immunosuppressive drugs, sequela: Secondary | ICD-10-CM | POA: Diagnosis not present

## 2014-12-13 DIAGNOSIS — L270 Generalized skin eruption due to drugs and medicaments taken internally: Secondary | ICD-10-CM | POA: Diagnosis not present

## 2014-12-13 DIAGNOSIS — Z923 Personal history of irradiation: Secondary | ICD-10-CM | POA: Diagnosis not present

## 2014-12-13 DIAGNOSIS — Z79899 Other long term (current) drug therapy: Secondary | ICD-10-CM | POA: Diagnosis not present

## 2014-12-13 DIAGNOSIS — J8410 Pulmonary fibrosis, unspecified: Secondary | ICD-10-CM | POA: Diagnosis not present

## 2014-12-13 DIAGNOSIS — C3412 Malignant neoplasm of upper lobe, left bronchus or lung: Secondary | ICD-10-CM | POA: Diagnosis not present

## 2014-12-13 LAB — COMPREHENSIVE METABOLIC PANEL
ALBUMIN: 3.5 g/dL (ref 3.4–5.0)
ALK PHOS: 95 U/L
ALT: 29 U/L
AST: 26 U/L (ref 15–37)
Anion Gap: 6 — ABNORMAL LOW (ref 7–16)
BUN: 6 mg/dL — ABNORMAL LOW (ref 7–18)
Bilirubin,Total: 0.4 mg/dL (ref 0.2–1.0)
Calcium, Total: 8.8 mg/dL (ref 8.5–10.1)
Chloride: 99 mmol/L (ref 98–107)
Co2: 32 mmol/L (ref 21–32)
Creatinine: 0.97 mg/dL (ref 0.60–1.30)
EGFR (African American): >60
EGFR (Non-African Amer.): >60
GLUCOSE: 103 mg/dL — AB (ref 65–99)
Osmolality: 272 (ref 275–301)
POTASSIUM: 4.5 mmol/L (ref 3.5–5.1)
SODIUM: 137 mmol/L (ref 136–145)
TOTAL PROTEIN: 6.9 g/dL (ref 6.4–8.2)

## 2014-12-13 LAB — CBC CANCER CENTER
BASOS ABS: 0.1 x10 3/mm (ref 0.0–0.1)
BASOS PCT: 1.1 %
EOS ABS: 0.2 x10 3/mm (ref 0.0–0.7)
Eosinophil %: 4 %
HCT: 41.8 % (ref 40.0–52.0)
HGB: 14.1 g/dL (ref 13.0–18.0)
LYMPHS PCT: 10.4 %
Lymphocyte #: 0.6 x10 3/mm — ABNORMAL LOW (ref 1.0–3.6)
MCH: 28.4 pg (ref 26.0–34.0)
MCHC: 33.7 g/dL (ref 32.0–36.0)
MCV: 85 fL (ref 80–100)
MONOS PCT: 7 %
Monocyte #: 0.4 x10 3/mm (ref 0.2–1.0)
Neutrophil #: 4.4 x10 3/mm (ref 1.4–6.5)
Neutrophil %: 77.5 %
Platelet: 405 x10 3/mm (ref 150–440)
RBC: 4.95 10*6/uL (ref 4.40–5.90)
RDW: 13 % (ref 11.5–14.5)
WBC: 5.6 x10 3/mm (ref 3.8–10.6)

## 2014-12-13 LAB — MAGNESIUM: MAGNESIUM: 2.1 mg/dL

## 2014-12-13 LAB — PHOSPHORUS: PHOSPHORUS: 3 mg/dL (ref 2.5–4.9)

## 2014-12-15 DIAGNOSIS — L270 Generalized skin eruption due to drugs and medicaments taken internally: Secondary | ICD-10-CM | POA: Diagnosis not present

## 2014-12-15 DIAGNOSIS — Z9221 Personal history of antineoplastic chemotherapy: Secondary | ICD-10-CM | POA: Diagnosis not present

## 2014-12-15 DIAGNOSIS — Z720 Tobacco use: Secondary | ICD-10-CM | POA: Diagnosis not present

## 2014-12-15 DIAGNOSIS — K8020 Calculus of gallbladder without cholecystitis without obstruction: Secondary | ICD-10-CM | POA: Diagnosis not present

## 2014-12-15 DIAGNOSIS — T451X5S Adverse effect of antineoplastic and immunosuppressive drugs, sequela: Secondary | ICD-10-CM | POA: Diagnosis not present

## 2014-12-15 DIAGNOSIS — C3412 Malignant neoplasm of upper lobe, left bronchus or lung: Secondary | ICD-10-CM | POA: Diagnosis not present

## 2014-12-15 DIAGNOSIS — J701 Chronic and other pulmonary manifestations due to radiation: Secondary | ICD-10-CM | POA: Diagnosis not present

## 2014-12-15 DIAGNOSIS — N62 Hypertrophy of breast: Secondary | ICD-10-CM | POA: Diagnosis not present

## 2014-12-15 DIAGNOSIS — Z923 Personal history of irradiation: Secondary | ICD-10-CM | POA: Diagnosis not present

## 2014-12-15 DIAGNOSIS — Z79899 Other long term (current) drug therapy: Secondary | ICD-10-CM | POA: Diagnosis not present

## 2015-01-08 ENCOUNTER — Ambulatory Visit: Payer: Self-pay | Admitting: Oncology

## 2015-01-17 DIAGNOSIS — C3412 Malignant neoplasm of upper lobe, left bronchus or lung: Secondary | ICD-10-CM | POA: Diagnosis not present

## 2015-01-17 DIAGNOSIS — Z923 Personal history of irradiation: Secondary | ICD-10-CM | POA: Diagnosis not present

## 2015-01-17 DIAGNOSIS — Z452 Encounter for adjustment and management of vascular access device: Secondary | ICD-10-CM | POA: Diagnosis not present

## 2015-01-17 DIAGNOSIS — Z9221 Personal history of antineoplastic chemotherapy: Secondary | ICD-10-CM | POA: Diagnosis not present

## 2015-02-06 ENCOUNTER — Ambulatory Visit
Admit: 2015-02-06 | Disposition: A | Discharge status: Home / Self Care | Payer: Self-pay | Attending: Oncology | Admitting: Oncology

## 2015-02-28 DIAGNOSIS — C3412 Malignant neoplasm of upper lobe, left bronchus or lung: Secondary | ICD-10-CM | POA: Diagnosis not present

## 2015-02-28 DIAGNOSIS — Z452 Encounter for adjustment and management of vascular access device: Secondary | ICD-10-CM | POA: Diagnosis not present

## 2015-02-28 DIAGNOSIS — Z9221 Personal history of antineoplastic chemotherapy: Secondary | ICD-10-CM | POA: Diagnosis not present

## 2015-02-28 DIAGNOSIS — Z923 Personal history of irradiation: Secondary | ICD-10-CM | POA: Diagnosis not present

## 2015-03-09 ENCOUNTER — Ambulatory Visit
Admit: 2015-03-09 | Disposition: A | Discharge status: Home / Self Care | Payer: Self-pay | Attending: Oncology | Admitting: Oncology

## 2015-03-14 DIAGNOSIS — K219 Gastro-esophageal reflux disease without esophagitis: Secondary | ICD-10-CM | POA: Diagnosis not present

## 2015-03-14 DIAGNOSIS — Z85118 Personal history of other malignant neoplasm of bronchus and lung: Secondary | ICD-10-CM | POA: Diagnosis not present

## 2015-03-16 DIAGNOSIS — Z923 Personal history of irradiation: Secondary | ICD-10-CM | POA: Diagnosis not present

## 2015-03-16 DIAGNOSIS — C3490 Malignant neoplasm of unspecified part of unspecified bronchus or lung: Secondary | ICD-10-CM | POA: Diagnosis not present

## 2015-03-16 DIAGNOSIS — C3412 Malignant neoplasm of upper lobe, left bronchus or lung: Secondary | ICD-10-CM | POA: Diagnosis not present

## 2015-03-16 DIAGNOSIS — Z79899 Other long term (current) drug therapy: Secondary | ICD-10-CM | POA: Diagnosis not present

## 2015-03-16 DIAGNOSIS — Z9221 Personal history of antineoplastic chemotherapy: Secondary | ICD-10-CM | POA: Diagnosis not present

## 2015-03-16 DIAGNOSIS — L270 Generalized skin eruption due to drugs and medicaments taken internally: Secondary | ICD-10-CM | POA: Diagnosis not present

## 2015-03-16 DIAGNOSIS — Z87891 Personal history of nicotine dependence: Secondary | ICD-10-CM | POA: Diagnosis not present

## 2015-03-16 DIAGNOSIS — R590 Localized enlarged lymph nodes: Secondary | ICD-10-CM | POA: Diagnosis not present

## 2015-03-16 DIAGNOSIS — T451X5S Adverse effect of antineoplastic and immunosuppressive drugs, sequela: Secondary | ICD-10-CM | POA: Diagnosis not present

## 2015-03-16 LAB — CBC CANCER CENTER
BASOS ABS: 0 x10 3/mm (ref 0.0–0.1)
BASOS PCT: 0.7 %
EOS PCT: 5.5 %
Eosinophil #: 0.4 x10 3/mm (ref 0.0–0.7)
HCT: 41.3 % (ref 40.0–52.0)
HGB: 13.7 g/dL (ref 13.0–18.0)
LYMPHS PCT: 9 %
Lymphocyte #: 0.6 x10 3/mm — ABNORMAL LOW (ref 1.0–3.6)
MCH: 28.2 pg (ref 26.0–34.0)
MCHC: 33.2 g/dL (ref 32.0–36.0)
MCV: 85 fL (ref 80–100)
MONOS PCT: 8.6 %
Monocyte #: 0.6 x10 3/mm (ref 0.2–1.0)
NEUTROS ABS: 5 x10 3/mm (ref 1.4–6.5)
Neutrophil %: 76.2 %
Platelet: 295 x10 3/mm (ref 150–440)
RBC: 4.87 10*6/uL (ref 4.40–5.90)
RDW: 13.5 % (ref 11.5–14.5)
WBC: 6.6 x10 3/mm (ref 3.8–10.6)

## 2015-03-16 LAB — COMPREHENSIVE METABOLIC PANEL
ANION GAP: 5 — AB (ref 7–16)
Albumin: 4 g/dL
Alkaline Phosphatase: 75 U/L
BILIRUBIN TOTAL: 0.7 mg/dL
BUN: 9 mg/dL
CHLORIDE: 104 mmol/L
Calcium, Total: 8.9 mg/dL
Co2: 25 mmol/L
Creatinine: 0.81 mg/dL
GLUCOSE: 98 mg/dL
Potassium: 3.9 mmol/L
SGOT(AST): 27 U/L
SGPT (ALT): 20 U/L
SODIUM: 134 mmol/L — AB
Total Protein: 6.8 g/dL

## 2015-03-16 LAB — MAGNESIUM: MAGNESIUM: 2.1 mg/dL

## 2015-03-16 LAB — PHOSPHORUS: PHOSPHORUS: 2.9 mg/dL

## 2015-03-21 DIAGNOSIS — R590 Localized enlarged lymph nodes: Secondary | ICD-10-CM | POA: Diagnosis not present

## 2015-03-21 DIAGNOSIS — Z87891 Personal history of nicotine dependence: Secondary | ICD-10-CM | POA: Diagnosis not present

## 2015-03-21 DIAGNOSIS — Z9221 Personal history of antineoplastic chemotherapy: Secondary | ICD-10-CM | POA: Diagnosis not present

## 2015-03-21 DIAGNOSIS — Z923 Personal history of irradiation: Secondary | ICD-10-CM | POA: Diagnosis not present

## 2015-03-21 DIAGNOSIS — C3412 Malignant neoplasm of upper lobe, left bronchus or lung: Secondary | ICD-10-CM | POA: Diagnosis not present

## 2015-03-21 DIAGNOSIS — Z79899 Other long term (current) drug therapy: Secondary | ICD-10-CM | POA: Diagnosis not present

## 2015-03-21 DIAGNOSIS — L270 Generalized skin eruption due to drugs and medicaments taken internally: Secondary | ICD-10-CM | POA: Diagnosis not present

## 2015-03-21 DIAGNOSIS — T451X5S Adverse effect of antineoplastic and immunosuppressive drugs, sequela: Secondary | ICD-10-CM | POA: Diagnosis not present

## 2015-03-23 ENCOUNTER — Other Ambulatory Visit: Payer: Self-pay | Admitting: Oncology

## 2015-03-23 DIAGNOSIS — C3490 Malignant neoplasm of unspecified part of unspecified bronchus or lung: Secondary | ICD-10-CM

## 2015-03-30 NOTE — Discharge Summary (Signed)
PATIENT NAME:  Christopher Huber, Christopher Huber MR#:  [id number] DATE OF BIRTH:  November 08, 1946  DATE OF ADMISSION:  01/30/2013 DATE OF DISCHARGE:  01/31/2013  PRIMARY CARE PHYSICIAN: Adrian Prows, MD  ONCOLOGIST:  Delight Hoh, MD  DISCHARGE DIAGNOSES: 1.  Syncope.  2.  Lung mass likely cancer.   HISTORY OF PRESENT ILLNESS: This is a pleasant relatively healthy 69 year old gentleman who syncopized church. He was admitted for further evaluation.   HOSPITAL COURSE BY ISSUE:  1.  Syncope. The patient remained stable on telemetry. Vital signs were stable. Echocardiogram normal. Carotid Dopplers negative. There was no obvious etiology for the syncope.  2.  Lung mass. The patient had CT scan with a very large lung mass, in his upper lobe.  He has undergone PET scan today. He will be discharged with followup with Dr. Grayland Ormond who saw him as an inpatient.   DISCHARGE MEDICATIONS: 1.  Aspirin 81 mg once a day.  2.  Multivitamin.  3.  Vitamin E.  4.  Omega-3 fatty acids.          DISCHARGE INSTRUCTIONS: The patient will follow up with Dr. Grayland Ormond in the next several days to review the PET scan and plan for further diagnosis and treatment of the lung mass. Followup with Dr. Ola Spurr in 1 to 2 weeks.  TIME SPENT: 30 minutes.  ____________________________ Cheral Marker. Ola Spurr, MD dpf:sb D: 01/31/2013 13:24:20 ET T: 01/31/2013 13:46:14 ET JOB#: [id number]  cc: Cheral Marker. Ola Spurr, MD, <Dictator> Marshaun Lortie Ola Spurr MD ELECTRONICALLY SIGNED 02/09/2013 19:26

## 2015-03-30 NOTE — Op Note (Signed)
PATIENT NAME:  Christopher Huber, Christopher Huber MR#:  [id number] DATE OF BIRTH:  1946-03-27  DATE OF PROCEDURE:  02/14/2013  PREOPERATIVE DIAGNOSES: 1.  Lung cancer.  2.  Limited venous access.   POSTOPERATIVE DIAGNOSES: 1.  Lung cancer 2.  Limited venous access.  PROCEDURES PERFORMED: 1.  Ultrasound guidance for vascular access right jugular vein.  2.  Fluoroscopic guidance for placement of catheter.  3.  Placement of right internal jugular CT compatible Infuse-a-Port.   SURGEON: Algernon Huxley, M.D.   ANESTHESIA: Local with moderate conscious sedation.   ESTIMATED BLOOD LOSS: Approximately 25 mL.   FLUOROSCOPY TIME: Less than 1 minute.   CONTRAST USED: None.   INDICATION FOR PROCEDURE: This is a 68 year old white male with lung cancer. We are asked to place a Port-A-Cath for chemotherapy and durable venous access. Risks and benefits were discussed. Informed consent was obtained.   DESCRIPTION OF PROCEDURE: The patient was brought to the vascular and interventional radiology suite. The right neck and chest were sterilely prepped and draped and a sterile surgical field was created. The right jugular vein was visualized with ultrasound and found to be widely patent. It was then accessed under direct ultrasound guidance without difficulty with a Seldinger needle and permanent image was recorded. A J-wire was placed. After skin nick and dilatation, the peel-away sheath was placed over the wire. I then anesthetized an area 2 fingerbreadths below the right clavicle. A transverse incision was created. An inferior pocket was created with electrocautery and blunt dissection and I dissected out the area to allow placement of a Port-A-Cath. The port was then secured to the chest wall with 2 Prolene sutures and a catheter was connected to the port. The catheter was then tunneled from the subclavicular incision to the access site. Using fluoroscopic guidance the catheter was cut to an appropriate length. It was then  placed through the peel-away sheath and the peel-away sheath removed.  The catheter tip was parked in excellent location at the cavoatrial junction without a kink. It withdrew blood well and flushed easily with heparinized saline. The subclavicular incision was closed with a running 3-0 Vicryl and a running 4-0 Monocryl and the access incision was closed with a single 4-0 Monocryl. Dermabond was placed as a dressing. The patient tolerated the procedure well and was taken to the recovery room in stable condition.    ____________________________ Algernon Huxley, MD jsd:cs D: 02/14/2013 13:50:07 ET T: 02/14/2013 15:54:43 ET JOB#: [id number]  cc: Algernon Huxley, MD, <Dictator> Kathlene November. Grayland Ormond, MD Algernon Huxley MD ELECTRONICALLY SIGNED 02/16/2013 14:05

## 2015-03-30 NOTE — Consult Note (Signed)
Reason for Visit: This 69 year old Male patient presents to the clinic for initial evaluation of  lung cancer .   Referred by Dr. Grayland Ormond.  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  Pathology Report pathology report reviewed   Imaging Report PET/CT scan and CT scans reviewed   Referral Report clinical notes reviewed   Planned Treatment Regimen concurrent chemotherapy and radiation therapy   HPI   patient is a 69 year old male who presented with loss of consciousness during church services about a month ago. Workup showed no evidence of cardiac event although CT scan demonstrated a left upper lobe lung lesion. Needle aspiration was performed and positive for adenocarcinoma withALK mutation and [employer] 19  depletion positive. PET/CT rate was performed showing avid uptake in the left upper lobe as well as contralateral avid uptake in mediastinal lymph nodes. Patient been seen and evaluated by medical oncology and is starting on systemic chemotherapy and he has had one cycleof weekly carboplatinum Taxol. He is doing well. Very little symptoms to complain of no cough hemoptysis or chest tightness. He tolerated his initial chemotherapy well without side effect. I've asked to evaluate the patient for possible adjuvantradiation therapy.  Past, Family and Social History:  Past Medical History positive   Past Medical History Comments sinusitis, also history of loss of consciousness as described in the   Family History positive   Family History Comments family history of coronary artery disease   Social History positive   Social History Comments 50-pack-year smoking history quit smoking 5-6 years prior no EtOH abuse history   Additional Past Medical and Surgical History accompanied by his wife today   Allergies:   Codeine: Unknown  Home Meds:  Home Medications: Medication Instructions  Status  Compazine tablet 10 mg 1 tab(s) orally every 6 hours x 30 days as needed for nausea Active  omega-3 polyunsaturated fatty acids ethyl esters 1000 mg oral capsule 1 cap(s)  once a day  Active  vitamin E 400 intl units oral capsule 1 cap(s)  once a day  Active  Multiple Vitamins oral tablet 1 tab(s)  once a day  Active  loratadine 10 mg oral capsule 1 cap(s) orally once a day, As Needed- for Shortness of Breath  Active  fluticasone nasal 27.5 mcg/inh nasal spray 1 spray(s) nasal once a day Active  Nyquil Cold Medicine   9 times a day, As Needed Active  Tussin CF 10 mg-100 mg-5 mg/5 mL oral syrup 10 milliliter(s) orally every 4 hours, As Needed Active  Vitamin C 1000 mg oral tablet 1 tab(s) orally once a day Active  magnesium oxide 400 mg oral tablet 1 tab(s) orally once a day Active  Vitamin D3 2000 intl units oral capsule 1 cap(s) orally once a day Active  vitamin E 400 intl units oral capsule 1 cap(s) orally once a day Active  Fish Oil 1000 mg oral capsule  orally once a day Active  glucosamine hydrochloride 1500 mg oral tablet 1 tab(s) orally once a day Active  omeprazole 40 mg oral delayed release capsule 1 cap(s) orally once a day Active  Aspir 81 81 mg oral tablet 1 tab(s) orally once a day Active   Review of Systems:  General negative   Performance Status (ECOG) 0   Skin negative   Breast negative   Ophthalmologic negative   ENMT negative   Respiratory and Thorax negative  Cardiovascular negative   Gastrointestinal negative   Genitourinary negative   Musculoskeletal negative   Neurological negative   Psychiatric negative   Hematology/Lymphatics negative   Endocrine negative   Allergic/Immunologic negative   Review of Systems   according to the nurse's notes except for the loss of consciousness as described abovePatient denies any weight loss, fatigue, weakness, fever, chills or night sweats. Patient denies any loss of vision, blurred vision. Patient  denies any ringing  of the ears or hearing loss. No irregular heartbeat. Patient denies heart murmur or history of fainting. Patient denies any chest pain or pain radiating to her upper extremities. Patient denies any shortness of breath, difficulty breathing at night, cough or hemoptysis. Patient denies any swelling in the lower legs. Patient denies any nausea vomiting, vomiting of blood, or coffee ground material in the vomitus. Patient denies any stomach pain. Patient states has had normal bowel movements no significant constipation or diarrhea. Patient denies any dysuria, hematuria or significant nocturia. Patient denies any problems walking, swelling in the joints or loss of balance. Patient denies any skin changes, loss of hair or loss of weight. Patient denies any excessive worrying or anxiety or significant depression. Patient denies any problems with insomnia. Patient denies excessive thirst, polyuria, polydipsia. Patient denies any swollen glands, patient denies easy bruising or easy bleeding. Patient denies any recent infections, allergies or URI. Patient "s visual fields have not changed significantly in recent time.  Nursing Notes:  Nursing Vital Signs and Chemo Nursing Nursing Notes: *CC Vital Signs Flowsheet:   17-Mar-14 10:34  Temp Temperature 97.2  Pulse Pulse 78  Respirations Respirations 20  SBP SBP 122  DBP DBP 73  Current Weight (kg) (kg) 85.3  Height (cm) centimeters 176  BSA (m2) 2   Physical Exam:  General/Skin/HEENT:  General normal   Skin normal   Eyes normal   ENMT normal   Head and Neck normal   Additional PE well-developed male in NAD. Lungs are clear to A&P. Cardiac examination shows irregular rate and rhythm. Abdomen is benign with no organomegaly or masses noted. No supraclavicular adenopathy is identified.   Breasts/Resp/CV/GI/GU:  Respiratory and Thorax normal   Cardiovascular normal   Gastrointestinal normal   Genitourinary normal    MS/Neuro/Psych/Lymph:  Musculoskeletal normal   Neurological normal   Lymphatics normal   Other Results:  Radiology Results: LabUnknown:    23-Feb-14 15:54, CT Chest and Abd With Contrast  PACS Image     24-Feb-14 15:32, PET/CT Scan Lung Cancer Diagnosis  PACS Image     24-Feb-14 15:52, CT [facility] Contrast  PACS Image   CT:    23-Feb-14 15:54, CT Chest and Abd With Contrast  CT Chest and Abd With Contrast   REASON FOR EXAM:    evaluate mass in left thorax/IV contrast only. Eval   abdominal aorta. Discussed w  COMMENTS:       PROCEDURE: CT  - CT CHEST AND ABDOMEN W  - Jan 30 2013  3:54PM     RESULT: Axial CT scanning was performed through the chest and abdomen   with reconstructions at 3 mm intervals and slice thicknesses. Review of   multiplanar reconstructed images was performed separately on the VIA   monitor. The patient received 100 cc of Isovue-370.    There is a heterogeneously enhancing soft tissue density mass occupying   much of the left upper lobe. This extends to the left hilum. It measures   9.6 cm AP x 6.6  cm transversely x 9.6 cm in superior to inferior   dimension. The mass abuts the aortic arch but does not appear to displace     or clearly invade the arch.     There is mild enlargement of hilar lymph nodes.There is a subcarinal   lymph node measuring 1.5 cm in diameter. There is no right hilar   lymphadenopathy. There are a few normal size lymph nodes in the   retrosternal region. There is no axillary lymphadenopathy. The cardiac   chambers are normal in size. The caliber of the thoracic aorta is normal.   There are no filling defects demonstrated within the pulmonary arterial   tree.    The right lung is well-expanded and exhibits no infiltrates. Both lungs   exhibit multiple scattered 2 to 3 mm diameter pulmonary nodules. At lung   window settings there is no evidence of invasion of ribs adjacent to the   mass appear  Conclusion:   1. There  is an abnormal mass occupying much of the anterior aspect of the   left upper lobe. The mass extends into the left hilum. This is highly   suspicious for malignancy.  2. There are multiple 2 to 3 mm diameter pulmonary parenchymal nodules   bilaterally worrisome for intrapulmonary metastatic disease.  3. There are a few borderline enlarged mediastinal lymph nodes.  4. There is no pleural nor pericardial effusion.    CT scan of the abdomen: The caliber of the abdominal aorta is normal. The   liver exhibits no focal mass orductal dilation. The gallbladder contains   at least one radiodense stone. There is no gallbladder wall thickening or   pericholecystic fluid. The pancreas, spleen, nondistended stomach,   adrenal glands, and kidneys are normal in appearance. There is no   periaortic or pericaval lymphadenopathy. The observed portions of the     small and large bowel are normal in appearance. The lumbar vertebral   bodies are preserved in height. There is no evidence of ascites.    IMPRESSION:   1. Please see thediscussion above regarding the findings in the thorax.  2. Within the abdomen there are no findings suspicious for primary or   metastatic malignancy. There is likely at least one calcified gallstone   present.     Dictation Site: 5        Verified By: DAVID A. Martinique, M.D., MD    24-Feb-14 15:52, CT Head [facility] Contrast  CT Head [facility] Contrast   REASON FOR EXAM:    probable lung cancer eval for mets  COMMENTS:       PROCEDURE: CT  - CT HEAD W/WO  - Jan 31 2013  3:52PM     RESULT: History: Lung cancer.    Comparison Study: Prior CT of 02/01/2009.     Findings: Standard nonenhanced and enhancedCT obtained. 300 cc of   Isovue-300 administered. No mass lesion or enhancing lesion. Posterior   fossa normal. No hydrocephalus or hemorrhage. Mucous retention cyst left   maxillary sinus.    IMPRESSION:  No acute or focal abnormality.    Verified By: Osa Craver, M.D., MD   Nuclear Med:    24-Feb-14 15:32, PET/CT Scan Lung Cancer Diagnosis  PET/CT Scan Lung Cancer Diagnosis   REASON FOR EXAM:    left lung mass  COMMENTS:       PROCEDURE: PET - PET/CT DX LUNG CA  - Jan 31 2013  3:32PM     RESULT: History: Lung  mass.    Comparison Study: CT of 01/30/2013.    Findings: Following determination of fasting blood sugar of 90 mg/dL   12.15 mCi of F-18 FDG administered. Intensely PET positive left upper   lobe mass lesion measuring approxi- 9 cm in maximum diameter is present   with SUV levels Max of 11. PET positive left hilar lymph nodes present.   PET positive mediastinal lymphadenopathy noted. This is a limited exam as   patient was claustrophobic and could not continue the scan.  IMPRESSION:  Intensely PET positive large left upper lobe mass with   multiple PET positive mediastinal and hilar lymph nodes. This is a   limited study as the patient was claustrophobic.        Verified By: Osa Craver, M.D., MD   Relevent Results:   Relevant Scans and Labs CT scans of the head chest andPET scan all reviewed.   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 I have recommended concurrent radiation therapy with chemotherapy. Would start out treating up to 4000 cGy over 4 weeks and evaluate the patient tolerance and response. Risks and benefits of treatment including dysphasia, fatigue, increasing productive cough, and skin changes or would explained in detail to the patient and his family. I have set him up for CT simulation later this week. I discussed the case personally with Dr. Grayland Ormond who concurs with treatment plan. Will cover all avid mediastinal nodes as well as his initial left upper lobe lesion on my initial round of  radiation. Hopefully will have some significant response and can tailor our fields for small field boost to much less lung volume.  I would like to take this opportunity to thank you for allowing me to continue to participate in this patient's care.  CC Referral:  cc: Dr. Adrian Prows   Electronic Signatures: Baruch Gouty, Roda Shutters (MD)  (Signed 17-Mar-14 12:42)  Authored: HPI, Diagnosis, PFSH, Allergies, Home Meds, ROS, Nursing Notes, Physical Exam, Other Results, Relevent Results, Encounter Assessment and Plan, CC Referring Physician   Last Updated: 17-Mar-14 12:42 by Armstead Peaks (MD)

## 2015-03-30 NOTE — H&P (Signed)
PATIENT NAME:  Christopher Huber, Christopher Huber MR#:  [id number] DATE OF BIRTH:  Jun 23, 1946  DATE OF ADMISSION:  01/30/2013  REFERRING PHYSICIAN:  Dr. Cheri Guppy.   FAMILY PHYSICIAN:  Dr. Ola Spurr.   REASON FOR ADMISSION:  Syncope.   HISTORY OF PRESENT ILLNESS:  The patient is a 69 year old male with a remote history of tobacco abuse, but no significant past medical history on no prescription medications who was sitting in church today and passed out. He presents to the Emergency Room where a chest x-ray revealed a mass in the left lobe of the lung. He was sent for CT and it revealed a 9 x 6 x 9 cm lung mass. The patient has no previous history of syncope. Denies chest pain or shortness of breath. Has had no weight loss, hemoptysis, or night sweats. He is now admitted for further evaluation.   PAST MEDICAL HISTORY:   1.  Remote history of tobacco abuse.  2.  History of sinusitis.   MEDICATIONS:   1.  Vitamin E 400 International Units p.o. daily.  2.  Fish oil 1 gram p.o. daily.  3.  Aspirin 81 mg p.o. daily.  4.  Multivitamin 1 p.o. daily.   ALLERGIES:  CODEINE.   SOCIAL HISTORY:  The patient quit smoking 5 to 6 years ago. Denies alcohol abuse.   FAMILY HISTORY:  Positive for coronary artery disease, but otherwise unremarkable. Negative for prostate or colon cancer.   REVIEW OF SYSTEMS:  CONSTITUTIONAL: No fever or change in weight.  EYES: No blurred or double vision. No glaucoma. No headaches.  ENT: No tinnitus or hearing loss. No nasal discharge or bleeding. No difficulty swallowing.  RESPIRATORY: No cough or wheezing. Denies hemoptysis. No painful respiration.  CARDIOVASCULAR: No chest pain or orthopnea. No palpitations.  GASTROINTESTINAL: No nausea, vomiting or diarrhea. No abdominal pain. No change in bowel habits.  GENITOURINARY: No dysuria or hematuria. No incontinence.  ENDOCRINE: No polyuria or polydipsia. No heat or cold intolerance.  HEMATOLOGIC: The patient denies anemia, easy bruising or  bleeding.  LYMPHATIC: No swollen glands.  MUSCULOSKELETAL: The patient denies pain in his neck, back, shoulders, knees, or hips. No gout.  NEUROLOGIC: No numbness or weakness. Denies migraines, stroke, or seizures.  PSYCHIATRIC: The patient denies anxiety, insomnia, or depression.   PHYSICAL EXAMINATION: GENERAL: The patient is in no acute distress.  VITAL SIGNS: Remarkable for blood pressure of 129/70 with a heart rate of 64 and a respiratory rate of 19. He is afebrile.  HEENT: Normocephalic, atraumatic. Pupils equally round and reactive to light and accommodation. Extraocular movements are intact. Sclerae are anicteric. Conjunctivae are clear. Oropharynx is clear.  NECK: Supple without JVD or bruits. No adenopathy or thyromegaly is noted.  LUNGS: Clear to auscultation and percussion without wheezes or rales, although scattered rhonchi are noted. No dullness. Respiratory effort is normal.  CARDIAC: Regular rate and rhythm with normal S1 and S2. No significant rubs, murmurs, or gallops. PMI is nondisplaced. Chest wall is nontender.  ABDOMEN: Soft, nontender with normoactive bowel sounds. No organomegaly or masses are appreciated. No hernias or bruits are noted.  EXTREMITIES: Without clubbing, cyanosis, or edema. Pulses are 2+ bilaterally.  SKIN: Warm and dry without rash or lesions.  NEUROLOGIC: Cranial nerves II through XII grossly intact. Deep tendon reflexes are symmetric. Motor and sensory exam is nonfocal.  PSYCHIATRIC: Exam reveals a patient who is alert and oriented to person, place, and time. He was cooperative and used good judgment.   LABORATORY DATA:  Total CK was 78 with an MB of less than 0.5. Glucose was 95 with a BUN of 8 and a creatinine of 0.78 with a sodium of 136 and a potassium of 3.9. GFR was greater than 60. White count was 8.9 with hemoglobin of 13.1 and a platelet count of 487,000. Urinalysis was negative. CT scan of the chest and abdomen revealed a 9 x 6 x 9 cm mass in the  left upper lobe which abutted the aortic arch. The abdomen itself was unremarkable. There were 2 to 3 mm pulmonary parenchymal nodules noted as well worrisome for metastatic disease.   ASSESSMENT:   1.  Syncope.  2.  Remote history of tobacco abuse.  3.  Lung mass worrisome for cancer.  4.  Thrombocytosis.   PLAN:  The patient will be observed on telemetry with aspirin therapy. We will follow serial cardiac enzymes and obtain an echocardiogram. We will also obtain carotid Dopplers and an MRI of the brain because of his syncopal episode. In regards to his lung mass, we will obtain a PET scan and an oncology consult. Clear liquid diet for now. Further treatment and evaluation will depend upon the patient's progress.   TOTAL TIME SPENT ON THIS PATIENT:  50 minutes.    ____________________________ Leonie Douglas Doy Hutching, MD jds:si D: 01/30/2013 17:00:00 ET T: 01/30/2013 17:24:32 ET JOB#: [id number]  cc: Leonie Douglas. Doy Hutching, MD, <Dictator> Cheral Marker. Ola Spurr, MD Jalyne Brodzinski Lennice Sites MD ELECTRONICALLY SIGNED 01/31/2013 8:01

## 2015-03-30 NOTE — Consult Note (Signed)
History of Present Illness:  Reason for Consult Lung mass highly suspicious for malignancy.   HPI   Patient is a 69 year old male with no significant past medical history who is sitting at church yesterday and had a syncopal episode. He has otherwise felt well.  He has no other neurologic complaints.  He denies any recent fevers or illnesses.  He denies any pain.  He has no shortness of breath, cough, or hemoptysis. He denies any weight loss.  He has no nausea, vomiting, constipation, or diarrhea.  He has no uinary complaints.  Patient otherwise feels well and offers no further specific complaints.  PFSH:  Additional Past Medical and Surgical History sinusitis.  Social history: Previous tobacco, quit 5-6 years ago.  Denies alcohol.  Family history: CAD.   Review of Systems:  Performance Status (ECOG) 0   Review of Systems   As per HPI. Otherwise, 10 point system review was negative.   NURSING NOTES: **Vital Signs.:   24-Feb-14 11:38   Vital Signs Type: Routine   Temperature Temperature (F): 98.3   Celsius: 36.8   Temperature Source: oral   Pulse Pulse: 71   Respirations Respirations: 20   Systolic BP Systolic BP: 027   Diastolic BP (mmHg) Diastolic BP (mmHg): 75   Mean BP: 93   Pulse Ox % Pulse Ox %: 95   Pulse Ox Activity Level: At rest   Oxygen Delivery: Room Air/ 21 %   Physical Exam:  Physical Exam General: Well-developed, well-nourished, no acute distress. Eyes: Pink conjunctiva, anicteric sclera. HEENT: Normocephalic, moist mucous membranes, clear oropharnyx. Lungs: Clear to auscultation bilaterally. Heart: Regular rate and rhythm. No rubs, murmurs, or gallops. Abdomen: Soft, nontender, nondistended. No organomegaly noted, normoactive bowel sounds. Musculoskeletal: No edema, cyanosis, or clubbing. Neuro: Alert, answering all questions appropriately. Cranial nerves grossly intact. Skin: No rashes or petechiae noted. Psych: Normal affect. Lymphatics:  No cervical, calvicular, axillary or inguinal LAD.    Codeine: Unknown    omega-3 polyunsaturated fatty acids ethyl esters 1000 mg oral capsule: 1 cap(s)  once a day , Status: Active, Quantity: 0, Refills: None   vitamin E 400 intl units oral capsule: 1 cap(s)  once a day , Status: Active, Quantity: 0, Refills: None   Multiple Vitamins oral tablet: 1 tab(s)  once a day , Status: Active, Quantity: 0, Refills: None  Laboratory Results: Hepatic:  24-Feb-14 02:33   Bilirubin, Total 0.6  Alkaline Phosphatase 93  SGPT (ALT) 14  SGOT (AST) 24  Total Protein, Serum 6.8  Albumin, Serum  3.3  Routine Chem:  24-Feb-14 02:33   Glucose, Serum  100  BUN  6  Creatinine (comp) 0.76  Sodium, Serum 139  Potassium, Serum 3.8  Chloride, Serum 106  CO2, Serum 26  Calcium (Total), Serum 8.9  Osmolality (calc) 275  eGFR (African American) >60  eGFR (Non-African American) >60 (eGFR values <35m/min/1.73 m2 may be an indication of chronic kidney disease (CKD). Calculated eGFR is useful in patients with stable renal function. The eGFR calculation will not be reliable in acutely ill patients when serum creatinine is changing rapidly. It is not useful in  patients on dialysis. The eGFR calculation may not be applicable to patients at the low and high extremes of body sizes, pregnant women, and vegetarians.)  Anion Gap 7  Cholesterol, Serum 183  Triglycerides, Serum 80  HDL (INHOUSE)  37  VLDL Cholesterol Calculated 16  LDL Cholesterol Calculated  130 (Result(s) reported on 31 Jan 2013 at 03:16AM.)  Cardiac:  24-Feb-14 02:33   Troponin I < 0.02 (0.00-0.05 0.05 ng/mL or less: NEGATIVE  Repeat testing in 3-6 hrs  if clinically indicated. >0.05 ng/mL: POTENTIAL  MYOCARDIAL INJURY. Repeat  testing in 3-6 hrs if  clinically indicated. NOTE: An increase or decrease  of 30% or more on serial  testing suggests a  clinically important change)  CK, Total 79  CPK-MB, Serum  < 0.5 (Result(s)  reported on 31 Jan 2013 at 03:55AM.)  Routine Hem:  24-Feb-14 02:33   WBC (CBC) 8.2  RBC (CBC) 4.48  Hemoglobin (CBC)  12.6  Hematocrit (CBC)  36.8  Platelet Count (CBC)  453  MCV 82  MCH 28.0  MCHC 34.1  RDW 12.9  Neutrophil % 73.0  Lymphocyte % 13.5  Monocyte % 8.7  Eosinophil % 3.9  Basophil % 0.9  Neutrophil # 6.0  Lymphocyte # 1.1  Monocyte # 0.7  Eosinophil # 0.3  Basophil # 0.1 (Result(s) reported on 31 Jan 2013 at 03:04AM.)   Assessment and Plan: Impression:   9 cm lung masses suspicious for underlying malignancy. Plan:   1.  Lung mass: Patient had a PET scan today and results are pending at time of dictation.  He will ulmately need a biopsy of this mass.  It appears that is accessible by simple bronchoscopy, but if this is not possible then would do CT-guided biopsy.  Patient had aspirin today, so biopsy likely will be delayed 5-7 days.  OK to discharge today and the remainder of his studies can be completed as outpatient.  Patient will followup in the [place] 2-3 days after his biopsy for further evaluation, discussion of the results, and treatment planning if necessary.  He expressed understanding and was in agreement with this plan.  Electronic Signatures: Delight Hoh (MD)  (Signed 24-Feb-14 15:56)  Authored: HISTORY OF PRESENT ILLNESS, PFSH, ROS, NURSING NOTES, PE, ALLERGIES, HOME MEDICATIONS, LABS, ASSESSMENT AND PLAN   Last Updated: 24-Feb-14 15:56 by Delight Hoh (MD)

## 2015-04-11 ENCOUNTER — Inpatient Hospital Stay: Payer: Medicare Other | Attending: Oncology

## 2015-04-11 DIAGNOSIS — Z923 Personal history of irradiation: Secondary | ICD-10-CM | POA: Diagnosis not present

## 2015-04-11 DIAGNOSIS — Z452 Encounter for adjustment and management of vascular access device: Secondary | ICD-10-CM | POA: Insufficient documentation

## 2015-04-11 DIAGNOSIS — C3412 Malignant neoplasm of upper lobe, left bronchus or lung: Secondary | ICD-10-CM | POA: Insufficient documentation

## 2015-04-11 DIAGNOSIS — Z9221 Personal history of antineoplastic chemotherapy: Secondary | ICD-10-CM | POA: Insufficient documentation

## 2015-04-11 DIAGNOSIS — Z85118 Personal history of other malignant neoplasm of bronchus and lung: Secondary | ICD-10-CM

## 2015-04-11 MED ORDER — HEPARIN SOD (PORK) LOCK FLUSH 100 UNIT/ML IV SOLN
500.0000 [IU] | Freq: Once | INTRAVENOUS | Status: AC
  Administered 2015-04-11: 500 [IU] via INTRAVENOUS
  Filled 2015-04-11: qty 5

## 2015-04-11 MED ORDER — SODIUM CHLORIDE 0.9 % IJ SOLN
10.0000 mL | INTRAMUSCULAR | Status: DC | PRN
  Administered 2015-04-11: 10 mL via INTRAVENOUS
  Filled 2015-04-11: qty 10

## 2015-05-23 ENCOUNTER — Inpatient Hospital Stay: Payer: Medicare Other | Attending: Oncology

## 2015-05-23 DIAGNOSIS — Z452 Encounter for adjustment and management of vascular access device: Secondary | ICD-10-CM | POA: Diagnosis not present

## 2015-05-23 DIAGNOSIS — Z923 Personal history of irradiation: Secondary | ICD-10-CM | POA: Diagnosis not present

## 2015-05-23 DIAGNOSIS — C3412 Malignant neoplasm of upper lobe, left bronchus or lung: Secondary | ICD-10-CM | POA: Insufficient documentation

## 2015-05-23 DIAGNOSIS — Z9221 Personal history of antineoplastic chemotherapy: Secondary | ICD-10-CM | POA: Diagnosis not present

## 2015-05-23 DIAGNOSIS — C801 Malignant (primary) neoplasm, unspecified: Secondary | ICD-10-CM

## 2015-05-23 MED ORDER — HEPARIN SOD (PORK) LOCK FLUSH 100 UNIT/ML IV SOLN
500.0000 [IU] | Freq: Once | INTRAVENOUS | Status: AC
Start: 2015-05-23 — End: 2015-05-23
  Administered 2015-05-23: 500 [IU] via INTRAVENOUS
  Filled 2015-05-23: qty 5

## 2015-05-23 MED ORDER — SODIUM CHLORIDE 0.9 % IJ SOLN
10.0000 mL | Freq: Once | INTRAMUSCULAR | Status: AC
  Administered 2015-05-23: 10 mL
  Filled 2015-05-23: qty 10

## 2015-05-28 ENCOUNTER — Ambulatory Visit: Payer: Medicare Other | Admitting: Radiation Oncology

## 2015-05-28 DIAGNOSIS — C3412 Malignant neoplasm of upper lobe, left bronchus or lung: Secondary | ICD-10-CM | POA: Insufficient documentation

## 2015-05-28 DIAGNOSIS — Z510 Encounter for antineoplastic radiation therapy: Secondary | ICD-10-CM | POA: Insufficient documentation

## 2015-06-18 ENCOUNTER — Other Ambulatory Visit: Payer: Self-pay

## 2015-06-18 DIAGNOSIS — C3490 Malignant neoplasm of unspecified part of unspecified bronchus or lung: Secondary | ICD-10-CM

## 2015-06-19 ENCOUNTER — Inpatient Hospital Stay: Payer: Medicare Other | Attending: Oncology

## 2015-06-19 ENCOUNTER — Ambulatory Visit
Admission: RE | Admit: 2015-06-19 | Discharge: 2015-06-19 | Disposition: A | Discharge status: Home / Self Care | Payer: Medicare Other | Source: Ambulatory Visit | Attending: Oncology | Admitting: Oncology

## 2015-06-19 DIAGNOSIS — Z87891 Personal history of nicotine dependence: Secondary | ICD-10-CM | POA: Insufficient documentation

## 2015-06-19 DIAGNOSIS — T451X5S Adverse effect of antineoplastic and immunosuppressive drugs, sequela: Secondary | ICD-10-CM | POA: Insufficient documentation

## 2015-06-19 DIAGNOSIS — Z79899 Other long term (current) drug therapy: Secondary | ICD-10-CM | POA: Diagnosis not present

## 2015-06-19 DIAGNOSIS — Z08 Encounter for follow-up examination after completed treatment for malignant neoplasm: Secondary | ICD-10-CM | POA: Diagnosis not present

## 2015-06-19 DIAGNOSIS — L270 Generalized skin eruption due to drugs and medicaments taken internally: Secondary | ICD-10-CM | POA: Diagnosis not present

## 2015-06-19 DIAGNOSIS — Z9221 Personal history of antineoplastic chemotherapy: Secondary | ICD-10-CM | POA: Diagnosis not present

## 2015-06-19 DIAGNOSIS — R918 Other nonspecific abnormal finding of lung field: Secondary | ICD-10-CM | POA: Diagnosis not present

## 2015-06-19 DIAGNOSIS — Z923 Personal history of irradiation: Secondary | ICD-10-CM | POA: Insufficient documentation

## 2015-06-19 DIAGNOSIS — K219 Gastro-esophageal reflux disease without esophagitis: Secondary | ICD-10-CM | POA: Insufficient documentation

## 2015-06-19 DIAGNOSIS — Z85118 Personal history of other malignant neoplasm of bronchus and lung: Secondary | ICD-10-CM | POA: Insufficient documentation

## 2015-06-19 DIAGNOSIS — K449 Diaphragmatic hernia without obstruction or gangrene: Secondary | ICD-10-CM | POA: Diagnosis not present

## 2015-06-19 DIAGNOSIS — M1990 Unspecified osteoarthritis, unspecified site: Secondary | ICD-10-CM | POA: Insufficient documentation

## 2015-06-19 DIAGNOSIS — C3490 Malignant neoplasm of unspecified part of unspecified bronchus or lung: Secondary | ICD-10-CM

## 2015-06-19 DIAGNOSIS — C3412 Malignant neoplasm of upper lobe, left bronchus or lung: Secondary | ICD-10-CM | POA: Insufficient documentation

## 2015-06-19 LAB — COMPREHENSIVE METABOLIC PANEL
ALBUMIN: 4.2 g/dL (ref 3.5–5.0)
ALT: 17 U/L (ref 17–63)
AST: 24 U/L (ref 15–41)
Alkaline Phosphatase: 75 U/L (ref 38–126)
Anion gap: 6 (ref 5–15)
BILIRUBIN TOTAL: 0.8 mg/dL (ref 0.3–1.2)
BUN: 11 mg/dL (ref 6–20)
CO2: 27 mmol/L (ref 22–32)
Calcium: 8.7 mg/dL — ABNORMAL LOW (ref 8.9–10.3)
Chloride: 102 mmol/L (ref 101–111)
Creatinine, Ser: 0.96 mg/dL (ref 0.61–1.24)
GFR calc non Af Amer: >60 mL/min (ref >60)
Glucose, Bld: 108 mg/dL — ABNORMAL HIGH (ref 65–99)
POTASSIUM: 4.3 mmol/L (ref 3.5–5.1)
SODIUM: 135 mmol/L (ref 135–145)
Total Protein: 7.1 g/dL (ref 6.5–8.1)

## 2015-06-19 LAB — CBC
HCT: 41.9 % (ref 40.0–52.0)
Hemoglobin: 14 g/dL (ref 13.0–18.0)
MCH: 28.2 pg (ref 26.0–34.0)
MCHC: 33.4 g/dL (ref 32.0–36.0)
MCV: 84.5 fL (ref 80.0–100.0)
Platelets: 328 10*3/uL (ref 150–440)
RBC: 4.95 MIL/uL (ref 4.40–5.90)
RDW: 13 % (ref 11.5–14.5)
WBC: 5.2 10*3/uL (ref 3.8–10.6)

## 2015-06-19 LAB — PHOSPHORUS: PHOSPHORUS: 3 mg/dL (ref 2.5–4.6)

## 2015-06-19 LAB — MAGNESIUM: Magnesium: 2 mg/dL (ref 1.7–2.4)

## 2015-06-19 MED ORDER — IOHEXOL 300 MG/ML  SOLN
75.0000 mL | Freq: Once | INTRAMUSCULAR | Status: AC | PRN
  Administered 2015-06-19: 75 mL via INTRAVENOUS

## 2015-06-21 ENCOUNTER — Ambulatory Visit
Admission: RE | Admit: 2015-06-21 | Discharge: 2015-06-21 | Disposition: A | Discharge status: Home / Self Care | Payer: Medicare Other | Source: Ambulatory Visit | Attending: Radiation Oncology | Admitting: Radiation Oncology

## 2015-06-21 ENCOUNTER — Inpatient Hospital Stay (HOSPITAL_BASED_OUTPATIENT_CLINIC_OR_DEPARTMENT_OTHER): Payer: Medicare Other | Admitting: Oncology

## 2015-06-21 ENCOUNTER — Other Ambulatory Visit: Payer: Self-pay

## 2015-06-21 ENCOUNTER — Encounter: Payer: Self-pay | Admitting: Radiation Oncology

## 2015-06-21 VITALS — BP 129/82 | HR 83 | Temp 96.9°F | Ht 69.0 in | Wt 190.4 lb

## 2015-06-21 DIAGNOSIS — Z87891 Personal history of nicotine dependence: Secondary | ICD-10-CM | POA: Diagnosis not present

## 2015-06-21 DIAGNOSIS — K219 Gastro-esophageal reflux disease without esophagitis: Secondary | ICD-10-CM

## 2015-06-21 DIAGNOSIS — L270 Generalized skin eruption due to drugs and medicaments taken internally: Secondary | ICD-10-CM

## 2015-06-21 DIAGNOSIS — M1990 Unspecified osteoarthritis, unspecified site: Secondary | ICD-10-CM | POA: Diagnosis not present

## 2015-06-21 DIAGNOSIS — C3411 Malignant neoplasm of upper lobe, right bronchus or lung: Secondary | ICD-10-CM

## 2015-06-21 DIAGNOSIS — Z9221 Personal history of antineoplastic chemotherapy: Secondary | ICD-10-CM

## 2015-06-21 DIAGNOSIS — T451X5S Adverse effect of antineoplastic and immunosuppressive drugs, sequela: Secondary | ICD-10-CM

## 2015-06-21 DIAGNOSIS — C3412 Malignant neoplasm of upper lobe, left bronchus or lung: Secondary | ICD-10-CM | POA: Diagnosis not present

## 2015-06-21 DIAGNOSIS — Z79899 Other long term (current) drug therapy: Secondary | ICD-10-CM | POA: Diagnosis not present

## 2015-06-21 DIAGNOSIS — R918 Other nonspecific abnormal finding of lung field: Secondary | ICD-10-CM

## 2015-06-21 DIAGNOSIS — Z923 Personal history of irradiation: Secondary | ICD-10-CM | POA: Diagnosis not present

## 2015-06-21 DIAGNOSIS — Z85118 Personal history of other malignant neoplasm of bronchus and lung: Secondary | ICD-10-CM

## 2015-06-21 HISTORY — DX: Gastro-esophageal reflux disease without esophagitis: K21.9

## 2015-06-21 HISTORY — DX: Unspecified osteoarthritis, unspecified site: M19.90

## 2015-06-21 HISTORY — DX: Malignant neoplasm of unspecified part of unspecified bronchus or lung: C34.90

## 2015-06-21 NOTE — Progress Notes (Signed)
Patient here today for a 2 year follow up  Of radiation treatment for Lung Cancer.  He and his wife are anxious to hear results of the CT scan completed on Tues 06/19/15.

## 2015-06-21 NOTE — Progress Notes (Signed)
Radiation Oncology Follow up Note  Name: Christopher Huber   Date:   06/21/2015 MRN:  [id number] DOB: [birth year]    This 69 y.o. male presents to the clinic today for new lesion right lung apex possibly second primary lung cancer.  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 He's had complete response with only some scar tissue present in the left upper lobe. Medical oncology has been following a right upper lobe lesion recently had a repeat CT scan showing increasing size of conglomerate right apical pulmonary mass with cavitation consistent with progressive disease. Again confirm fibrosis in the left upper lobe consistent with treatment response. Patient is asymptomatic specifically denies cough hemoptysis or chest tightness.  COMPLICATIONS OF TREATMENT: none  FOLLOW UP COMPLIANCE: keeps appointments   PHYSICAL EXAM:  BP 129/82 mmHg  Pulse 83  Temp(Src) 96.9 F (36.1 C)  Ht _0  (1.753 m)  Wt 190 lb 5.9 oz (86.35 kg)  BMI 28.10 kg/m2 Well-developed well-nourished patient in NAD. HEENT reveals PERLA, EOMI, discs not visualized.  Oral cavity is clear. No oral mucosal lesions are identified. Neck is clear without evidence of cervical or supraclavicular adenopathy. Lungs are clear to A&P. Cardiac examination is essentially unremarkable with regular rate and rhythm without murmur rub or thrill. Abdomen is benign with no organomegaly or masses noted. Motor sensory and DTR levels are equal and symmetric in the upper and lower extremities. Cranial nerves II through XII are grossly intact. Proprioception is intact. No peripheral adenopathy or edema is identified. No motor or sensory levels are noted. Crude visual fields are within normal range.   RADIOLOGY RESULTS: recent CT scan is reviewed and  compatible with the above-stated findings  PLAN: this time I talked medical oncology and will be arranging a CT-guided biopsy to confirm malignancy. This may be a stage I lesion follow-up PET CT scan may be indicated. Believe the lesion is of size were I can perform SB RT 5000 cGy in 5 fractions and will discuss this once we have tissue confirmation of malignancy. Risks and benefits of treatment including skin reaction, fatigue, loss of normal lung volume, alteration of blood counts, all were discussed in detail with the patient. He will follow-up with medical oncology I will see him back once tissue diagnosis is established. Patient is to call sooner with any concerns.  I would like to take this opportunity for allowing me to participate in the care of your patient.Armstead Peaks., MD

## 2015-06-25 ENCOUNTER — Ambulatory Visit: Payer: Self-pay | Admitting: Radiation Oncology

## 2015-06-25 ENCOUNTER — Ambulatory Visit: Payer: Self-pay | Admitting: Oncology

## 2015-06-27 ENCOUNTER — Other Ambulatory Visit: Payer: Self-pay | Admitting: Radiology

## 2015-06-28 ENCOUNTER — Ambulatory Visit
Admission: RE | Admit: 2015-06-28 | Discharge: 2015-06-28 | Disposition: A | Discharge status: Home / Self Care | Payer: Medicare Other | Source: Ambulatory Visit | Attending: Oncology | Admitting: Oncology

## 2015-06-28 ENCOUNTER — Ambulatory Visit
Admission: RE | Admit: 2015-06-28 | Discharge: 2015-06-28 | Disposition: A | Discharge status: Home / Self Care | Payer: Medicare Other | Source: Ambulatory Visit | Attending: Interventional Radiology | Admitting: Interventional Radiology

## 2015-06-28 DIAGNOSIS — Z9889 Other specified postprocedural states: Secondary | ICD-10-CM

## 2015-06-28 DIAGNOSIS — Z08 Encounter for follow-up examination after completed treatment for malignant neoplasm: Secondary | ICD-10-CM | POA: Diagnosis present

## 2015-06-28 DIAGNOSIS — C3411 Malignant neoplasm of upper lobe, right bronchus or lung: Secondary | ICD-10-CM | POA: Diagnosis not present

## 2015-06-28 DIAGNOSIS — R918 Other nonspecific abnormal finding of lung field: Secondary | ICD-10-CM | POA: Insufficient documentation

## 2015-06-28 DIAGNOSIS — K219 Gastro-esophageal reflux disease without esophagitis: Secondary | ICD-10-CM | POA: Diagnosis not present

## 2015-06-28 DIAGNOSIS — M1990 Unspecified osteoarthritis, unspecified site: Secondary | ICD-10-CM | POA: Diagnosis not present

## 2015-06-28 DIAGNOSIS — Z85118 Personal history of other malignant neoplasm of bronchus and lung: Secondary | ICD-10-CM | POA: Diagnosis not present

## 2015-06-28 LAB — APTT: aPTT: 36 seconds (ref 24–36)

## 2015-06-28 LAB — PROTIME-INR
INR: 0.98
Prothrombin Time: 13.2 seconds (ref 11.4–15.0)

## 2015-06-28 MED ORDER — FENTANYL CITRATE (PF) 100 MCG/2ML IJ SOLN
INTRAMUSCULAR | Status: AC | PRN
  Administered 2015-06-28: 50 ug via INTRAVENOUS

## 2015-06-28 MED ORDER — MIDAZOLAM HCL 5 MG/5ML IJ SOLN
INTRAMUSCULAR | Status: AC | PRN
Start: 2015-06-28 — End: 2015-06-28
  Administered 2015-06-28: 0.5 mg via INTRAVENOUS
  Administered 2015-06-28: 1 mg via INTRAVENOUS

## 2015-06-28 MED ORDER — SODIUM CHLORIDE 0.9 % IV SOLN
Freq: Once | INTRAVENOUS | Status: AC
  Administered 2015-06-28: 08:00:00 via INTRAVENOUS

## 2015-06-28 NOTE — Procedures (Signed)
Interventional Radiology Procedure Note  Procedure: CT guided biopsy of RUL pulmonary mass Complications: No immediate Estimated Blood Loss: 0 Recommendations: - Bedrest until CXR cleared.  Minimize talking, coughing or otherwise straining.  - Follow up 2 hr CXR pending   Signed,  Criselda Peaches, MD

## 2015-06-28 NOTE — Progress Notes (Signed)
[facility]  Telephone:([phone]) [phone] Fax:(336) [phone]  ID: Christopher Huber OB: [birth year]  MR#: [id number]  DGU#:[id number]  Patient Care Team: Adrian Prows, MD as PCP - General (Infectious Diseases)  CHIEF COMPLAINT: No chief complaint on file.   INTERVAL HISTORY: Patient returns to clinic today for further evaluation, laboratory work, and discussion of his CT scan results.  He continues to tolerate 50 mg Tarceva well. His rash is improved.  He currently feels well and is asymptomatic.  He has no neurologic complaints.  He has a good appetite and denies weight loss.  He denies any recent fevers.  He has no chest pain, shortness of breath, cough, or hemoptysis.  He has no nausea, vomiting, constipation, or diarrhea.  Patient offers no specific complaints today.   REVIEW OF SYSTEMS:   Review of Systems  Constitutional: Negative.   Respiratory: Negative.   Cardiovascular: Negative.   Skin: Positive for rash.    As per HPI. Otherwise, a complete review of systems is negatve.  PAST MEDICAL HISTORY: Past Medical History  Diagnosis Date  . Cancer   . Lung cancer   . Arthritis     right arm  . GERD (gastroesophageal reflux disease)     PAST SURGICAL HISTORY: Past Surgical History  Procedure Laterality Date  . Colonoscopy    . Esophagogastroduodenoscopy      FAMILY HISTORY No family history on file.     ADVANCED DIRECTIVES:    HEALTH MAINTENANCE: History  Substance Use Topics  . Smoking status: Former Smoker    Quit date: 06/27/1997  . Smokeless tobacco: Never Used  . Alcohol Use: No     Colonoscopy:  PAP:  Bone density:  Lipid panel:  Allergies  Allergen Reactions  . Codeine Nausea And Vomiting    Dizzy     Current Outpatient Prescriptions  Medication Sig Dispense Refill  . Ascorbic Acid (VITAMIN C) 1000 MG tablet Take 1,000 mg by mouth daily.    . Cholecalciferol (VITAMIN D) 2000 UNITS tablet Take 2,000 Units by mouth daily.     Marland Kitchen erlotinib (TARCEVA) 25 MG tablet Take 50 mg by mouth daily. Take on an empty stomach 1 hour before meals or 2 hours after.    . magnesium oxide (MAG-OX) 400 MG tablet Take 400 mg by mouth daily.    . Multiple Vitamin (MULTIVITAMIN WITH MINERALS) TABS Take 1 tablet by mouth daily.    . vitamin E 400 UNIT capsule Take 400 Units by mouth daily.     No current facility-administered medications for this visit.    OBJECTIVE: There were no vitals filed for this visit.   There is no weight on file to calculate BMI.    ECOG FS:0 - Asymptomatic  General: Well-developed, well-nourished, no acute distress. Eyes: anicteric sclera. Lungs: Clear to auscultation bilaterally. Heart: Regular rate and rhythm. No rubs, murmurs, or gallops. Abdomen: Soft, nontender, nondistended. No organomegaly noted, normoactive bowel sounds. Musculoskeletal: No edema, cyanosis, or clubbing. Neuro: Alert, answering all questions appropriately. Cranial nerves grossly intact. Skin: No rashes or petechiae noted. Psych: Normal affect.  LAB RESULTS:  Lab Results  Component Value Date   NA 135 06/19/2015   K 4.3 06/19/2015   CL 102 06/19/2015   CO2 27 06/19/2015   GLUCOSE 108* 06/19/2015   BUN 11 06/19/2015   CREATININE 0.96 06/19/2015   CALCIUM 8.7* 06/19/2015   PROT 7.1 06/19/2015   ALBUMIN 4.2 06/19/2015   AST 24 06/19/2015   ALT 17 06/19/2015  ALKPHOS 75 06/19/2015   BILITOT 0.8 06/19/2015   GFRNONAA >60 06/19/2015   GFRAA >60 06/19/2015    Lab Results  Component Value Date   WBC 5.2 06/19/2015   NEUTROABS 5.0 03/16/2015   HGB 14.0 06/19/2015   HCT 41.9 06/19/2015   MCV 84.5 06/19/2015   PLT 328 06/19/2015     STUDIES: Dg Chest 1 View  06/28/2015   CLINICAL DATA:  Status post right lung biopsy  EXAM: CHEST  1 VIEW  COMPARISON:  Chest CT June 19, 2015  FINDINGS: No pneumothorax. There is again noted a mass in the right apex. There is patchy surrounding opacity, possibly representing hemorrhage  from recent biopsy. There is left upper lobe volume loss, unchanged from recent CT. Elsewhere lungs clear. Heart size and pulmonary vascularity are within normal limits. No adenopathy appreciable. Port-A-Cath tip is in the superior vena cava. No pneumothorax. No bone lesions.  IMPRESSION: Persistent mass right upper lobe. Persistent volume loss left upper lobe. No pneumothorax. No change in cardiac silhouette.   Electronically Signed   By: Lowella Grip III M.D.   On: 06/28/2015 12:00   Ct Chest W Contrast  06/19/2015   CLINICAL DATA:  Restaging of lung cancer.  Diagnosed in 2014.  EXAM: CT CHEST WITH CONTRAST  TECHNIQUE: Multidetector CT imaging of the chest was performed during intravenous contrast administration.  CONTRAST:  85m OMNIPAQUE IOHEXOL 300 MG/ML  SOLN  COMPARISON:  03/16/2015  FINDINGS: Mediastinum/Nodes: No supraclavicular adenopathy. A right-sided Port-A-Cath which terminates at the superior caval/ atrial junction. Aortic and branch vessel atherosclerosis. Normal heart size, without pericardial effusion. No central pulmonary embolism, on this non-dedicated study. No mediastinal or hilar adenopathy. A small hiatal hernia.  Lungs/Pleura: No pleural fluid. Moderate centrilobular emphysema. Increased confluence of previously described right apical and upper lobe pulmonary nodules. For example, partially cavitary 2.7 x 3.1 cm mass on image 11 of series 3 is at the site of nodules measuring 1.0 cm on the prior exam. Contiguous more cephalad portion measures 3.1 x 3.2 cm on image 8 of series 3 today. At the site of a 1.9 x 1.7 cm nodular component on the prior.  6 mm right lower lobe pulmonary nodule on image 17 is similar in size on the prior exam, slightly more conspicuous today. This could be due to differences in slice selection. An adjacent 5 mm nodule is similar.  Redemonstration of radiation fibrosis in the posterior left upper lobe. No well-defined residual or recurrent disease identified.   Right lower lobe 2 mm nodule on image 27 is unchanged.  Upper abdomen: Normal imaged portions of the liver, spleen, adrenal glands, kidneys, gallbladder. Re- demonstration of pancreatic lipoma. Example image 65.  Musculoskeletal: No acute osseous abnormality.  IMPRESSION: 1. Increasing size of a now conglomerate right apical pulmonary mass with cavitation. Most consistent with metastatic disease. 2. Posterior left upper lobe radiation fibrosis, without locally recurrent disease. 3. Small hiatal hernia.   Electronically Signed   By: KAbigail MiyamotoM.D.   On: 06/19/2015 10:35   Ct Biopsy  06/28/2015   CLINICAL DATA:  69year old male with a history of left upper lobe primary bronchogenic adenocarcinoma treated with combined chemotherapy and radiation. Patient now has an enlarging and cavitary mass in the right upper lobe with surrounding satellite nodules concerning for metastatic disease versus an attack redness primary lung carcinoma.  EXAM: CT BIOPSY  Date: 06/28/2015  PROCEDURE: 1. CT-guided biopsy right upper lobe pulmonary mass Interventional Radiologist:  HAntonietta Jewel  Laurence Ferrari, MD  ANESTHESIA/SEDATION: Moderate (conscious) sedation was used. 1.5 mg Versed, 50 mcg Fentanyl were administered intravenously. The patient's vital signs were monitored continuously by radiology nursing throughout the procedure.  Sedation Time: 20 minutes  MEDICATIONS: None additional  TECHNIQUE: Informed consent was obtained from the patient following explanation of the procedure, risks, benefits and alternatives. The patient understands, agrees and consents for the procedure. All questions were addressed. A time out was performed.  A planning axial CT scan was performed. The right upper lobe a pulmonary mass was successfully identified. A suitable skin entry site was selected and marked. The region was then sterilely prepped and draped in standard fashion using chlorhexidine skin prep.  Local anesthesia was attained by infiltration with  1% lidocaine. A small dermatotomy was made. Under intermittent CT fluoroscopic guidance, a 17 gauge introducer needle was advanced through the posterior chest wall and into the margin of the mass. Multiple 18 gauge core biopsies were then coaxially obtained using the bio Pince automated biopsy device.  Biopsy specimens were placed in formalin and delivered to pathology for further analysis. The bio sentry device was employed prior to removing the introducer needle. The patient tolerated the procedure well.  COMPLICATIONS: None  Estimated blood loss: 0  IMPRESSION: Technically successful CT-guided core biopsy right upper lobe pulmonary mass.   Electronically Signed   By: Jacqulynn Cadet M.D.   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  PLAN:    1.  Lung cancer:  CT scan independently reviewed and noted with continued progression of disease. Given slowly enlarging lesion, will proceed with biopsy to confirm recurrence. Patient has been instructed to discontinue his Tarceva at this time. Return to clinic approximately one week after his biopsy for discussion results and treatment planning. He will also have consultation with radiation oncology to determine if SBRT is a possibility.  2.  Rash:  Secondary to Tarceva.  Continue hydrocortisone cream as needed. 3.  Hypomagnesemia: Resolved.  Approximately 30 minutes was spent in discussion and consultation.   Patient expressed understanding and was in agreement with this plan. He also understands that He can call clinic at any time with any questions, concerns, or complaints.   No matching staging information was found for the patient.  Lloyd Huger, MD   06/28/2015 6:02 PM

## 2015-07-02 ENCOUNTER — Ambulatory Visit: Payer: Self-pay | Admitting: Oncology

## 2015-07-04 ENCOUNTER — Encounter: Payer: Self-pay | Admitting: Radiation Oncology

## 2015-07-04 ENCOUNTER — Ambulatory Visit
Admission: RE | Admit: 2015-07-04 | Discharge: 2015-07-04 | Disposition: A | Discharge status: Home / Self Care | Payer: Medicare Other | Source: Ambulatory Visit | Attending: Radiation Oncology | Admitting: Radiation Oncology

## 2015-07-04 ENCOUNTER — Inpatient Hospital Stay (HOSPITAL_BASED_OUTPATIENT_CLINIC_OR_DEPARTMENT_OTHER): Payer: Medicare Other | Admitting: Oncology

## 2015-07-04 VITALS — BP 119/73 | HR 85 | Temp 98.5°F | Resp 18 | Wt 192.2 lb

## 2015-07-04 DIAGNOSIS — Z923 Personal history of irradiation: Secondary | ICD-10-CM | POA: Diagnosis not present

## 2015-07-04 DIAGNOSIS — Z85118 Personal history of other malignant neoplasm of bronchus and lung: Secondary | ICD-10-CM | POA: Diagnosis not present

## 2015-07-04 DIAGNOSIS — M1990 Unspecified osteoarthritis, unspecified site: Secondary | ICD-10-CM | POA: Diagnosis not present

## 2015-07-04 DIAGNOSIS — Z87891 Personal history of nicotine dependence: Secondary | ICD-10-CM | POA: Diagnosis not present

## 2015-07-04 DIAGNOSIS — C3492 Malignant neoplasm of unspecified part of left bronchus or lung: Principal | ICD-10-CM

## 2015-07-04 DIAGNOSIS — L270 Generalized skin eruption due to drugs and medicaments taken internally: Secondary | ICD-10-CM | POA: Diagnosis not present

## 2015-07-04 DIAGNOSIS — L271 Localized skin eruption due to drugs and medicaments taken internally: Secondary | ICD-10-CM | POA: Diagnosis not present

## 2015-07-04 DIAGNOSIS — C3411 Malignant neoplasm of upper lobe, right bronchus or lung: Secondary | ICD-10-CM | POA: Diagnosis not present

## 2015-07-04 DIAGNOSIS — K219 Gastro-esophageal reflux disease without esophagitis: Secondary | ICD-10-CM | POA: Diagnosis not present

## 2015-07-04 DIAGNOSIS — C3491 Malignant neoplasm of unspecified part of right bronchus or lung: Secondary | ICD-10-CM

## 2015-07-04 DIAGNOSIS — Z79899 Other long term (current) drug therapy: Secondary | ICD-10-CM

## 2015-07-04 DIAGNOSIS — T451X5S Adverse effect of antineoplastic and immunosuppressive drugs, sequela: Secondary | ICD-10-CM

## 2015-07-04 DIAGNOSIS — Z9221 Personal history of antineoplastic chemotherapy: Secondary | ICD-10-CM | POA: Diagnosis not present

## 2015-07-04 DIAGNOSIS — R918 Other nonspecific abnormal finding of lung field: Secondary | ICD-10-CM | POA: Diagnosis not present

## 2015-07-04 DIAGNOSIS — C3412 Malignant neoplasm of upper lobe, left bronchus or lung: Secondary | ICD-10-CM | POA: Diagnosis not present

## 2015-07-04 NOTE — Progress Notes (Signed)
Radiation Oncology Follow up Note  Name: OLE LAFON   Date:   07/04/2015 MRN:  [id number] DOB: Oct 21, 1946    This 69 y.o. male presents to the clinic today for follow-up of new right upper lobe lung lesion.  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 He's done well has only some scar tissue present on his follow-up scans although has developed a right upper lobe lesion which we've been following and is showing increased size. 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 He tolerated the procedure well. He is asymptomatic specifically denies cough hemoptysis or chest tightness.  COMPLICATIONS OF TREATMENT: none  FOLLOW UP COMPLIANCE: keeps appointments   PHYSICAL EXAM:  BP 119/73 mmHg  Pulse 85  Temp(Src) 98.5 F (36.9 C)  Resp 18  Wt 192 lb 3.9 oz (87.2 kg) Well-developed well-nourished patient in NAD. HEENT reveals PERLA, EOMI, discs not visualized.  Oral cavity is clear. No oral mucosal lesions are identified. Neck is clear without evidence of cervical or supraclavicular adenopathy. Lungs are clear to A&P. Cardiac examination is essentially unremarkable with regular rate and rhythm without murmur rub or thrill. Abdomen is benign with no organomegaly or masses noted. Motor sensory and DTR levels are equal and symmetric in the upper and lower extremities. Cranial nerves II through XII are grossly intact. Proprioception is intact. No peripheral adenopathy or edema is identified. No motor or sensory levels are noted. Crude visual fields are within normal range.   RADIOLOGY RESULTS: CT scans were again reviewed  PLAN: at this time I to go ahead with SB RT to his  right upper lobe lesion. Would plan on delivering 5000 cGy in 5 fractions. I have set up and ordered CT simulation next week. The side effect profile of SB RT is extremely minimal but may include cough as necrotic tumor is cleared from his chest, possible skin reaction, possible fatigue all were discussed in detail with the patient and his wife. I discussed the case personally with medical oncology.  I would like to take this opportunity for allowing me to participate in the care of your patient.Armstead Peaks., MD

## 2015-07-05 ENCOUNTER — Ambulatory Visit: Payer: Self-pay | Admitting: Oncology

## 2015-07-10 ENCOUNTER — Ambulatory Visit
Admission: RE | Admit: 2015-07-10 | Discharge: 2015-07-10 | Disposition: A | Discharge status: Home / Self Care | Payer: Medicare Other | Source: Ambulatory Visit | Attending: Radiation Oncology | Admitting: Radiation Oncology

## 2015-07-10 DIAGNOSIS — C3412 Malignant neoplasm of upper lobe, left bronchus or lung: Secondary | ICD-10-CM | POA: Diagnosis not present

## 2015-07-10 DIAGNOSIS — Z510 Encounter for antineoplastic radiation therapy: Secondary | ICD-10-CM | POA: Diagnosis not present

## 2015-07-11 ENCOUNTER — Telehealth: Payer: Self-pay

## 2015-07-11 DIAGNOSIS — C3412 Malignant neoplasm of upper lobe, left bronchus or lung: Secondary | ICD-10-CM | POA: Diagnosis not present

## 2015-07-11 DIAGNOSIS — Z510 Encounter for antineoplastic radiation therapy: Secondary | ICD-10-CM | POA: Diagnosis not present

## 2015-07-11 NOTE — Progress Notes (Signed)
[facility]  Telephone:([phone]) [phone] Fax:([phone]) [phone]  ID: Christopher Huber OB: 11/11/1946  MR#: [id number]  DTH#:[id number]  Patient Care Team: Adrian Prows, MD as PCP - General (Infectious Diseases)  CHIEF COMPLAINT: No chief complaint on file.   INTERVAL HISTORY: Patient returns to clinic today for further evaluation and discussion of his pathology results. He currently feels well and is asymptomatic.  He has no neurologic complaints.  He has a good appetite and denies weight loss.  He denies any recent fevers.  He has no chest pain, shortness of breath, cough, or hemoptysis.  He has no nausea, vomiting, constipation, or diarrhea.  Patient offers no specific complaints today.   REVIEW OF SYSTEMS:   Review of Systems  Constitutional: Negative.   Respiratory: Negative.   Cardiovascular: Negative.   Skin: Positive for rash.    As per HPI. Otherwise, a complete review of systems is negatve.  PAST MEDICAL HISTORY: Past Medical History  Diagnosis Date  . Cancer   . Lung cancer   . Arthritis     right arm  . GERD (gastroesophageal reflux disease)     PAST SURGICAL HISTORY: Past Surgical History  Procedure Laterality Date  . Colonoscopy    . Esophagogastroduodenoscopy      FAMILY HISTORY No family history on file.     ADVANCED DIRECTIVES:    HEALTH MAINTENANCE: History  Substance Use Topics  . Smoking status: Former Smoker    Quit date: 06/27/1997  . Smokeless tobacco: Never Used  . Alcohol Use: No     Colonoscopy:  PAP:  Bone density:  Lipid panel:  Allergies  Allergen Reactions  . Codeine Nausea And Vomiting    Dizzy     Current Outpatient Prescriptions  Medication Sig Dispense Refill  . Ascorbic Acid (VITAMIN C) 1000 MG tablet Take 1,000 mg by mouth daily.    . Cholecalciferol (VITAMIN D) 2000 UNITS tablet Take 2,000 Units by mouth daily.    Marland Kitchen erlotinib (TARCEVA) 25 MG tablet Take 50 mg by mouth daily. Take on an empty  stomach 1 hour before meals or 2 hours after.    . magnesium oxide (MAG-OX) 400 MG tablet Take 400 mg by mouth daily.    . Multiple Vitamin (MULTIVITAMIN WITH MINERALS) TABS Take 1 tablet by mouth daily.    . vitamin E 400 UNIT capsule Take 400 Units by mouth daily.     No current facility-administered medications for this visit.    OBJECTIVE: There were no vitals filed for this visit.   There is no weight on file to calculate BMI.    ECOG FS:0 - Asymptomatic  General: Well-developed, well-nourished, no acute distress. Eyes: anicteric sclera. Lungs: Clear to auscultation bilaterally. Heart: Regular rate and rhythm. No rubs, murmurs, or gallops. Abdomen: Soft, nontender, nondistended. No organomegaly noted, normoactive bowel sounds. Musculoskeletal: No edema, cyanosis, or clubbing. Neuro: Alert, answering all questions appropriately. Cranial nerves grossly intact. Skin: No rashes or petechiae noted. Psych: Normal affect.  LAB RESULTS:  Lab Results  Component Value Date   NA 135 06/19/2015   K 4.3 06/19/2015   CL 102 06/19/2015   CO2 27 06/19/2015   GLUCOSE 108* 06/19/2015   BUN 11 06/19/2015   CREATININE 0.96 06/19/2015   CALCIUM 8.7* 06/19/2015   PROT 7.1 06/19/2015   ALBUMIN 4.2 06/19/2015   AST 24 06/19/2015   ALT 17 06/19/2015   ALKPHOS 75 06/19/2015   BILITOT 0.8 06/19/2015   GFRNONAA >60 06/19/2015  GFRAA >60 06/19/2015    Lab Results  Component Value Date   WBC 5.2 06/19/2015   NEUTROABS 5.0 03/16/2015   HGB 14.0 06/19/2015   HCT 41.9 06/19/2015   MCV 84.5 06/19/2015   PLT 328 06/19/2015     STUDIES: Dg Chest 1 View  06/28/2015   CLINICAL DATA:  Status post right lung biopsy  EXAM: CHEST  1 VIEW  COMPARISON:  Chest CT June 19, 2015  FINDINGS: No pneumothorax. There is again noted a mass in the right apex. There is patchy surrounding opacity, possibly representing hemorrhage from recent biopsy. There is left upper lobe volume loss, unchanged from recent  CT. Elsewhere lungs clear. Heart size and pulmonary vascularity are within normal limits. No adenopathy appreciable. Port-A-Cath tip is in the superior vena cava. No pneumothorax. No bone lesions.  IMPRESSION: Persistent mass right upper lobe. Persistent volume loss left upper lobe. No pneumothorax. No change in cardiac silhouette.   Electronically Signed   By: Lowella Grip III M.D.   On: 06/28/2015 12:00   Ct Chest W Contrast  06/19/2015   CLINICAL DATA:  Restaging of lung cancer.  Diagnosed in 2014.  EXAM: CT CHEST WITH CONTRAST  TECHNIQUE: Multidetector CT imaging of the chest was performed during intravenous contrast administration.  CONTRAST:  26m OMNIPAQUE IOHEXOL 300 MG/ML  SOLN  COMPARISON:  03/16/2015  FINDINGS: Mediastinum/Nodes: No supraclavicular adenopathy. A right-sided Port-A-Cath which terminates at the superior caval/ atrial junction. Aortic and branch vessel atherosclerosis. Normal heart size, without pericardial effusion. No central pulmonary embolism, on this non-dedicated study. No mediastinal or hilar adenopathy. A small hiatal hernia.  Lungs/Pleura: No pleural fluid. Moderate centrilobular emphysema. Increased confluence of previously described right apical and upper lobe pulmonary nodules. For example, partially cavitary 2.7 x 3.1 cm mass on image 11 of series 3 is at the site of nodules measuring 1.0 cm on the prior exam. Contiguous more cephalad portion measures 3.1 x 3.2 cm on image 8 of series 3 today. At the site of a 1.9 x 1.7 cm nodular component on the prior.  6 mm right lower lobe pulmonary nodule on image 17 is similar in size on the prior exam, slightly more conspicuous today. This could be due to differences in slice selection. An adjacent 5 mm nodule is similar.  Redemonstration of radiation fibrosis in the posterior left upper lobe. No well-defined residual or recurrent disease identified.  Right lower lobe 2 mm nodule on image 27 is unchanged.  Upper abdomen: Normal  imaged portions of the liver, spleen, adrenal glands, kidneys, gallbladder. Re- demonstration of pancreatic lipoma. Example image 65.  Musculoskeletal: No acute osseous abnormality.  IMPRESSION: 1. Increasing size of a now conglomerate right apical pulmonary mass with cavitation. Most consistent with metastatic disease. 2. Posterior left upper lobe radiation fibrosis, without locally recurrent disease. 3. Small hiatal hernia.   Electronically Signed   By: KAbigail MiyamotoM.D.   On: 06/19/2015 10:35   Ct Biopsy  06/28/2015   CLINICAL DATA:  69year old male with a history of left upper lobe primary bronchogenic adenocarcinoma treated with combined chemotherapy and radiation. Patient now has an enlarging and cavitary mass in the right upper lobe with surrounding satellite nodules concerning for metastatic disease versus an attack redness primary lung carcinoma.  EXAM: CT BIOPSY  Date: 06/28/2015  PROCEDURE: 1. CT-guided biopsy right upper lobe pulmonary mass Interventional Radiologist:  HCriselda Peaches MD  ANESTHESIA/SEDATION: Moderate (conscious) sedation was used. 1.5 mg Versed, 50 mcg Fentanyl  were administered intravenously. The patient's vital signs were monitored continuously by radiology nursing throughout the procedure.  Sedation Time: 20 minutes  MEDICATIONS: None additional  TECHNIQUE: Informed consent was obtained from the patient following explanation of the procedure, risks, benefits and alternatives. The patient understands, agrees and consents for the procedure. All questions were addressed. A time out was performed.  A planning axial CT scan was performed. The right upper lobe a pulmonary mass was successfully identified. A suitable skin entry site was selected and marked. The region was then sterilely prepped and draped in standard fashion using chlorhexidine skin prep.  Local anesthesia was attained by infiltration with 1% lidocaine. A small dermatotomy was made. Under intermittent CT  fluoroscopic guidance, a 17 gauge introducer needle was advanced through the posterior chest wall and into the margin of the mass. Multiple 18 gauge core biopsies were then coaxially obtained using the bio Pince automated biopsy device.  Biopsy specimens were placed in formalin and delivered to pathology for further analysis. The bio sentry device was employed prior to removing the introducer needle. The patient tolerated the procedure well.  COMPLICATIONS: None  Estimated blood loss: 0  IMPRESSION: Technically successful CT-guided core biopsy right upper lobe pulmonary mass.   Electronically Signed   By: Jacqulynn Cadet M.D.   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  PLAN:    1.  Lung cancer:  Biopsy confirmed malignancy, likely a second primary rather than progression of disease. Patient had consultation with radiation oncology today and is proceeding with SBRT for treatment.  He has been instructed to hold his Tarceva until completion of his XRT then it would be reasonable to reinitiate treatment at 50 mg daily. Return to clinic in 4 months with repeat imaging and further evaluation.  2.  Rash:  Secondary to Tarceva.  Continue hydrocortisone cream as needed. 3.  Hypomagnesemia: Resolved.   Patient expressed understanding and was in agreement with this plan. He also understands that He can call clinic at any time with any questions, concerns, or complaints.   No matching staging information was found for the patient.  Lloyd Huger, MD   07/11/2015 8:50 AM

## 2015-07-11 NOTE — Telephone Encounter (Signed)
Patient received message that he is to hold Tarceva until he has finished XRT but he does not start unitl 07/27/15 and he is concerned with holding Tarceva that long.

## 2015-07-20 ENCOUNTER — Other Ambulatory Visit: Payer: Self-pay | Admitting: *Deleted

## 2015-07-20 DIAGNOSIS — C3492 Malignant neoplasm of unspecified part of left bronchus or lung: Secondary | ICD-10-CM

## 2015-07-20 DIAGNOSIS — C3412 Malignant neoplasm of upper lobe, left bronchus or lung: Secondary | ICD-10-CM | POA: Diagnosis not present

## 2015-07-20 DIAGNOSIS — Z510 Encounter for antineoplastic radiation therapy: Secondary | ICD-10-CM | POA: Diagnosis not present

## 2015-07-23 DIAGNOSIS — C3412 Malignant neoplasm of upper lobe, left bronchus or lung: Secondary | ICD-10-CM | POA: Diagnosis not present

## 2015-07-23 DIAGNOSIS — Z510 Encounter for antineoplastic radiation therapy: Secondary | ICD-10-CM | POA: Diagnosis not present

## 2015-07-25 ENCOUNTER — Ambulatory Visit: Payer: Medicare Other | Admitting: Radiation Oncology

## 2015-07-25 ENCOUNTER — Ambulatory Visit
Admission: RE | Admit: 2015-07-25 | Discharge: 2015-07-25 | Disposition: A | Discharge status: Home / Self Care | Payer: Medicare Other | Source: Ambulatory Visit | Attending: Radiation Oncology | Admitting: Radiation Oncology

## 2015-07-25 ENCOUNTER — Inpatient Hospital Stay: Payer: Medicare Other | Attending: Oncology

## 2015-07-25 DIAGNOSIS — Z510 Encounter for antineoplastic radiation therapy: Secondary | ICD-10-CM | POA: Diagnosis not present

## 2015-07-25 DIAGNOSIS — Z452 Encounter for adjustment and management of vascular access device: Secondary | ICD-10-CM | POA: Diagnosis not present

## 2015-07-25 DIAGNOSIS — C3412 Malignant neoplasm of upper lobe, left bronchus or lung: Secondary | ICD-10-CM | POA: Diagnosis not present

## 2015-07-25 DIAGNOSIS — C3411 Malignant neoplasm of upper lobe, right bronchus or lung: Secondary | ICD-10-CM | POA: Insufficient documentation

## 2015-07-25 DIAGNOSIS — Z9221 Personal history of antineoplastic chemotherapy: Secondary | ICD-10-CM | POA: Insufficient documentation

## 2015-07-25 DIAGNOSIS — C801 Malignant (primary) neoplasm, unspecified: Secondary | ICD-10-CM

## 2015-07-25 MED ORDER — HEPARIN SOD (PORK) LOCK FLUSH 100 UNIT/ML IV SOLN
INTRAVENOUS | Status: AC
  Filled 2015-07-25: qty 5

## 2015-07-25 MED ORDER — SODIUM CHLORIDE 0.9 % IJ SOLN
10.0000 mL | Freq: Once | INTRAMUSCULAR | Status: AC
  Administered 2015-07-25: 10 mL via INTRAVENOUS
  Filled 2015-07-25: qty 10

## 2015-07-25 MED ORDER — HEPARIN SOD (PORK) LOCK FLUSH 100 UNIT/ML IV SOLN
500.0000 [IU] | Freq: Once | INTRAVENOUS | Status: AC
  Administered 2015-07-25: 500 [IU] via INTRAVENOUS

## 2015-07-26 ENCOUNTER — Ambulatory Visit
Admission: RE | Admit: 2015-07-26 | Discharge: 2015-07-26 | Disposition: A | Discharge status: Home / Self Care | Payer: Medicare Other | Source: Ambulatory Visit | Attending: Radiation Oncology | Admitting: Radiation Oncology

## 2015-07-26 DIAGNOSIS — Z510 Encounter for antineoplastic radiation therapy: Secondary | ICD-10-CM | POA: Diagnosis not present

## 2015-07-26 DIAGNOSIS — C3412 Malignant neoplasm of upper lobe, left bronchus or lung: Secondary | ICD-10-CM | POA: Diagnosis not present

## 2015-07-27 ENCOUNTER — Ambulatory Visit: Payer: Medicare Other | Admitting: Radiation Oncology

## 2015-07-27 ENCOUNTER — Ambulatory Visit
Admission: RE | Admit: 2015-07-27 | Discharge: 2015-07-27 | Disposition: A | Discharge status: Home / Self Care | Payer: Medicare Other | Source: Ambulatory Visit | Attending: Radiation Oncology | Admitting: Radiation Oncology

## 2015-07-27 DIAGNOSIS — Z510 Encounter for antineoplastic radiation therapy: Secondary | ICD-10-CM | POA: Diagnosis not present

## 2015-07-27 DIAGNOSIS — C3412 Malignant neoplasm of upper lobe, left bronchus or lung: Secondary | ICD-10-CM | POA: Diagnosis not present

## 2015-07-30 ENCOUNTER — Ambulatory Visit
Admission: RE | Admit: 2015-07-30 | Discharge: 2015-07-30 | Disposition: A | Discharge status: Home / Self Care | Payer: Medicare Other | Source: Ambulatory Visit | Attending: Radiation Oncology | Admitting: Radiation Oncology

## 2015-07-30 ENCOUNTER — Ambulatory Visit: Payer: Medicare Other | Admitting: Radiation Oncology

## 2015-07-30 DIAGNOSIS — Z510 Encounter for antineoplastic radiation therapy: Secondary | ICD-10-CM | POA: Diagnosis not present

## 2015-07-30 DIAGNOSIS — C3412 Malignant neoplasm of upper lobe, left bronchus or lung: Secondary | ICD-10-CM | POA: Diagnosis not present

## 2015-07-31 ENCOUNTER — Ambulatory Visit
Admission: RE | Admit: 2015-07-31 | Discharge: 2015-07-31 | Disposition: A | Discharge status: Home / Self Care | Payer: Medicare Other | Source: Ambulatory Visit | Attending: Radiation Oncology | Admitting: Radiation Oncology

## 2015-07-31 DIAGNOSIS — C3412 Malignant neoplasm of upper lobe, left bronchus or lung: Secondary | ICD-10-CM | POA: Diagnosis not present

## 2015-07-31 DIAGNOSIS — Z510 Encounter for antineoplastic radiation therapy: Secondary | ICD-10-CM | POA: Diagnosis not present

## 2015-08-01 ENCOUNTER — Ambulatory Visit: Payer: Medicare Other | Admitting: Radiation Oncology

## 2015-08-01 ENCOUNTER — Inpatient Hospital Stay: Payer: Medicare Other

## 2015-08-01 ENCOUNTER — Ambulatory Visit
Admission: RE | Admit: 2015-08-01 | Discharge: 2015-08-01 | Disposition: A | Discharge status: Home / Self Care | Payer: Medicare Other | Source: Ambulatory Visit | Attending: Radiation Oncology | Admitting: Radiation Oncology

## 2015-08-01 DIAGNOSIS — Z9221 Personal history of antineoplastic chemotherapy: Secondary | ICD-10-CM | POA: Diagnosis not present

## 2015-08-01 DIAGNOSIS — C3492 Malignant neoplasm of unspecified part of left bronchus or lung: Secondary | ICD-10-CM

## 2015-08-01 DIAGNOSIS — C3491 Malignant neoplasm of unspecified part of right bronchus or lung: Secondary | ICD-10-CM

## 2015-08-01 DIAGNOSIS — C3412 Malignant neoplasm of upper lobe, left bronchus or lung: Secondary | ICD-10-CM | POA: Diagnosis not present

## 2015-08-01 DIAGNOSIS — Z452 Encounter for adjustment and management of vascular access device: Secondary | ICD-10-CM | POA: Diagnosis not present

## 2015-08-01 DIAGNOSIS — C3411 Malignant neoplasm of upper lobe, right bronchus or lung: Secondary | ICD-10-CM | POA: Diagnosis not present

## 2015-08-01 DIAGNOSIS — Z510 Encounter for antineoplastic radiation therapy: Secondary | ICD-10-CM | POA: Diagnosis not present

## 2015-08-01 LAB — COMPREHENSIVE METABOLIC PANEL
ALT: 14 U/L — AB (ref 17–63)
AST: 21 U/L (ref 15–41)
Albumin: 3.9 g/dL (ref 3.5–5.0)
Alkaline Phosphatase: 81 U/L (ref 38–126)
Anion gap: 6 (ref 5–15)
BUN: 9 mg/dL (ref 6–20)
CALCIUM: 8.3 mg/dL — AB (ref 8.9–10.3)
CO2: 24 mmol/L (ref 22–32)
Chloride: 101 mmol/L (ref 101–111)
Creatinine, Ser: 0.83 mg/dL (ref 0.61–1.24)
Glucose, Bld: 132 mg/dL — ABNORMAL HIGH (ref 65–99)
Potassium: 3.7 mmol/L (ref 3.5–5.1)
Sodium: 131 mmol/L — ABNORMAL LOW (ref 135–145)
Total Bilirubin: 0.5 mg/dL (ref 0.3–1.2)
Total Protein: 6.8 g/dL (ref 6.5–8.1)

## 2015-08-01 LAB — CBC
HCT: 37.6 % — ABNORMAL LOW (ref 40.0–52.0)
Hemoglobin: 12.9 g/dL — ABNORMAL LOW (ref 13.0–18.0)
MCH: 28.1 pg (ref 26.0–34.0)
MCHC: 34.2 g/dL (ref 32.0–36.0)
MCV: 82.3 fL (ref 80.0–100.0)
Platelets: 356 10*3/uL (ref 150–440)
RBC: 4.58 MIL/uL (ref 4.40–5.90)
RDW: 12.7 % (ref 11.5–14.5)
WBC: 5.8 10*3/uL (ref 3.8–10.6)

## 2015-08-02 ENCOUNTER — Ambulatory Visit
Admission: RE | Admit: 2015-08-02 | Discharge: 2015-08-02 | Disposition: A | Discharge status: Home / Self Care | Payer: Medicare Other | Source: Ambulatory Visit | Attending: Radiation Oncology | Admitting: Radiation Oncology

## 2015-08-02 DIAGNOSIS — Z510 Encounter for antineoplastic radiation therapy: Secondary | ICD-10-CM | POA: Diagnosis not present

## 2015-08-02 DIAGNOSIS — C3412 Malignant neoplasm of upper lobe, left bronchus or lung: Secondary | ICD-10-CM | POA: Diagnosis not present

## 2015-08-03 ENCOUNTER — Ambulatory Visit
Admission: RE | Admit: 2015-08-03 | Discharge: 2015-08-03 | Disposition: A | Discharge status: Home / Self Care | Payer: Medicare Other | Source: Ambulatory Visit | Attending: Radiation Oncology | Admitting: Radiation Oncology

## 2015-08-03 DIAGNOSIS — C3412 Malignant neoplasm of upper lobe, left bronchus or lung: Secondary | ICD-10-CM | POA: Diagnosis not present

## 2015-08-03 DIAGNOSIS — Z510 Encounter for antineoplastic radiation therapy: Secondary | ICD-10-CM | POA: Diagnosis not present

## 2015-08-06 ENCOUNTER — Telehealth: Payer: Self-pay | Admitting: *Deleted

## 2015-08-06 ENCOUNTER — Ambulatory Visit: Payer: Medicare Other | Admitting: Radiation Oncology

## 2015-08-06 ENCOUNTER — Ambulatory Visit
Admission: RE | Admit: 2015-08-06 | Discharge: 2015-08-06 | Disposition: A | Discharge status: Home / Self Care | Payer: Medicare Other | Source: Ambulatory Visit | Attending: Radiation Oncology | Admitting: Radiation Oncology

## 2015-08-06 DIAGNOSIS — C3412 Malignant neoplasm of upper lobe, left bronchus or lung: Secondary | ICD-10-CM | POA: Diagnosis not present

## 2015-08-06 DIAGNOSIS — Z510 Encounter for antineoplastic radiation therapy: Secondary | ICD-10-CM | POA: Diagnosis not present

## 2015-08-06 NOTE — Telephone Encounter (Signed)
Yes he should begin Tarceva after completion of radiation therapy.

## 2015-08-06 NOTE — Telephone Encounter (Signed)
  Oncology Nurse Navigator Documentation    Navigator Encounter Type: Telephone (08/06/15 1100)                   Received voicemail from patient asking if he should resume Tarceva following completion of radiation treatment? His last treatment is tomorrow.

## 2015-08-06 NOTE — Telephone Encounter (Signed)
  Oncology Nurse Navigator Documentation    Navigator Encounter Type: Telephone;Screening (08/06/15 1300)                        Relayed information noted below to patient that he should resume taking Tarceva after completion of radiation. Patient verbalizes understanding.

## 2015-08-07 ENCOUNTER — Ambulatory Visit
Admission: RE | Admit: 2015-08-07 | Discharge: 2015-08-07 | Disposition: A | Discharge status: Home / Self Care | Payer: Medicare Other | Source: Ambulatory Visit | Attending: Radiation Oncology | Admitting: Radiation Oncology

## 2015-08-07 DIAGNOSIS — C3412 Malignant neoplasm of upper lobe, left bronchus or lung: Secondary | ICD-10-CM | POA: Diagnosis not present

## 2015-08-07 DIAGNOSIS — Z510 Encounter for antineoplastic radiation therapy: Secondary | ICD-10-CM | POA: Diagnosis not present

## 2015-09-05 ENCOUNTER — Other Ambulatory Visit: Payer: Self-pay | Admitting: *Deleted

## 2015-09-05 ENCOUNTER — Telehealth: Payer: Self-pay | Admitting: *Deleted

## 2015-09-05 MED ORDER — ERLOTINIB HCL 25 MG PO TABS
50.0000 mg | ORAL_TABLET | Freq: Every day | ORAL | Status: DC
Start: 2015-09-05 — End: 2015-12-05

## 2015-09-05 NOTE — Telephone Encounter (Signed)
Tarceva rx faxed to [facility].

## 2015-09-05 NOTE — Telephone Encounter (Signed)
Needs refill from drug company on his Tarceva

## 2015-09-07 ENCOUNTER — Ambulatory Visit: Payer: Medicare Other | Admitting: Radiation Oncology

## 2015-09-11 ENCOUNTER — Ambulatory Visit
Admission: RE | Admit: 2015-09-11 | Discharge: 2015-09-11 | Disposition: A | Discharge status: Home / Self Care | Payer: Medicare Other | Source: Ambulatory Visit | Attending: Oncology | Admitting: Oncology

## 2015-09-11 ENCOUNTER — Encounter: Payer: Self-pay | Admitting: Internal Medicine

## 2015-09-11 ENCOUNTER — Ambulatory Visit (INDEPENDENT_AMBULATORY_CARE_PROVIDER_SITE_OTHER): Payer: Medicare Other | Admitting: Internal Medicine

## 2015-09-11 ENCOUNTER — Other Ambulatory Visit: Payer: Self-pay | Admitting: *Deleted

## 2015-09-11 ENCOUNTER — Telehealth: Payer: Self-pay | Admitting: *Deleted

## 2015-09-11 ENCOUNTER — Other Ambulatory Visit: Payer: Self-pay | Admitting: Internal Medicine

## 2015-09-11 VITALS — BP 120/60 | HR 88 | Temp 98.4°F | Ht 68.5 in | Wt 187.0 lb

## 2015-09-11 DIAGNOSIS — R05 Cough: Secondary | ICD-10-CM | POA: Diagnosis not present

## 2015-09-11 DIAGNOSIS — Z85118 Personal history of other malignant neoplasm of bronchus and lung: Secondary | ICD-10-CM | POA: Diagnosis not present

## 2015-09-11 DIAGNOSIS — R059 Cough, unspecified: Secondary | ICD-10-CM

## 2015-09-11 DIAGNOSIS — K219 Gastro-esophageal reflux disease without esophagitis: Secondary | ICD-10-CM | POA: Insufficient documentation

## 2015-09-11 DIAGNOSIS — Z23 Encounter for immunization: Secondary | ICD-10-CM | POA: Diagnosis not present

## 2015-09-11 DIAGNOSIS — R222 Localized swelling, mass and lump, trunk: Secondary | ICD-10-CM | POA: Diagnosis not present

## 2015-09-11 DIAGNOSIS — C3411 Malignant neoplasm of upper lobe, right bronchus or lung: Secondary | ICD-10-CM

## 2015-09-11 DIAGNOSIS — R053 Chronic cough: Secondary | ICD-10-CM | POA: Insufficient documentation

## 2015-09-11 MED ORDER — BENZONATATE 100 MG PO CAPS: 100.0000 mg | ORAL_CAPSULE | Freq: Three times a day (TID) | ORAL | Status: DC | PRN

## 2015-09-11 NOTE — Assessment & Plan Note (Signed)
Fortunately he seems to have a second localized cancer (not a recurrence of past cancer) Has done the RT and back on tarceva

## 2015-09-11 NOTE — Progress Notes (Signed)
Pre visit review using our clinic review tool, if applicable. No additional management support is needed unless otherwise documented below in the visit note. 

## 2015-09-11 NOTE — Assessment & Plan Note (Signed)
Seems to be controlled with the ranitidine

## 2015-09-11 NOTE — Patient Instructions (Signed)
Please check to see if a Td or Tdap vaccine is covered by your insurance.

## 2015-09-11 NOTE — Telephone Encounter (Signed)
Patient has a non productive cough that is not going away. He is on Tarceva. Denies fever. Asking if he should have a CXR done

## 2015-09-11 NOTE — Addendum Note (Signed)
Addended by: Despina Hidden on: 09/11/2015 03:10 PM   Modules accepted: Orders

## 2015-09-11 NOTE — Telephone Encounter (Signed)
CXR ordered by XRT

## 2015-09-11 NOTE — Progress Notes (Signed)
Subjective:    Patient ID: Christopher Huber, male    DOB: 04/10/1946, 69 y.o.   MRN: [id number]  HPI Here to establish care with primary care doctor  Has oncology care Left lung cancer 2 years ago New lesion on right ---likely a second primary Just had course of RT from Dr Donella Stade On tarceva--but now with cough. 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 Ascorbic Acid (VITAMIN C) 1000 MG tablet Take 1,000 mg by mouth daily.    Marland Kitchen erlotinib (TARCEVA) 25 MG tablet Take 2 tablets (50 mg total) by mouth daily. Take on an empty stomach 1 hour before meals or 2 hours after. 60 tablet 0  . magnesium oxide (MAG-OX) 400 MG tablet Take 400 mg by mouth daily.    . Multiple Vitamin (MULTIVITAMIN WITH MINERALS) TABS Take 1 tablet by mouth daily.    . vitamin E 400 UNIT capsule Take 400 Units by mouth daily.     No current facility-administered medications on file prior to visit.    Allergies  Allergen Reactions  . Codeine Nausea And Vomiting    Dizzy     Past Medical History  Diagnosis Date  . Lung cancer ([place])   . Arthritis     right arm  . GERD (gastroesophageal reflux disease)     Past Surgical History  Procedure Laterality Date  . Colonoscopy    . Esophagogastroduodenoscopy    . Left shoulder reconstruction  ~1965    after hunting accident    Family History  Problem Relation Age of Onset  . Heart disease Father   . Diabetes Neg Hx   . Cancer Neg Hx     Social History   Social History  . Marital Status: Married    Spouse Name: N/A  . Number of Children: 2  . Years of Education: N/A   Occupational History  . 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 Smoking status: Former Smoker    Quit date: 06/27/1997  . Smokeless tobacco: Never Used  . Alcohol Use: No  . Drug Use: No  . Sexual Activity: Not on file   Other Topics Concern  . Not on file   Social History Narrative   2 daughters      Has living will   Wife is health care POA   Would accept resuscitation at this point   Would consider tube feedings if prognosis is unclear   Review of Systems  Constitutional: Positive for fatigue. Negative for unexpected weight change.       Wears seat belt  Eyes: Negative for visual disturbance.  Respiratory: Positive for cough and shortness of breath.        Tries to walk regularly--- walks dogs  Cardiovascular: Negative for chest pain, palpitations and leg swelling.  Gastrointestinal: Negative for nausea, constipation and blood in stool.  Genitourinary: Negative for urgency and difficulty urinating.  Musculoskeletal: Negative for back pain, joint swelling and arthralgias.  Skin: Negative for rash.  Neurological: Positive for dizziness and headaches. Negative for syncope.       Chronic vertigo Thinks tarceva causing current intermittent dizziness  Psychiatric/Behavioral: Negative for sleep disturbance and dysphoric mood. The patient is not nervous/anxious.        Objective:   Physical Exam  Constitutional: He appears well-developed and well-nourished. No distress.  Occasional dry cough  HENT:  Mouth/Throat: Oropharynx is clear and moist. No oropharyngeal exudate.  Neck: Normal range of motion. Neck supple. No thyromegaly present.  Cardiovascular: Normal rate, regular rhythm, normal heart sounds and intact distal pulses.  Exam reveals no gallop.   No murmur heard. bigeminy  Pulmonary/Chest: Effort normal. No respiratory distress. He has no wheezes. He has no rales.  Bronchial sounds in LUL  Abdominal: Soft. There is no tenderness.  Musculoskeletal: He exhibits no edema or tenderness.    Lymphadenopathy:    He has no cervical adenopathy.  Psychiatric: He has a normal mood and affect. His behavior is normal.          Assessment & Plan:

## 2015-09-11 NOTE — Assessment & Plan Note (Signed)
Intermittent and dry Could be related to allergies (he does have some runny nose but doesn't take anything for this) Could be reflux related --but seems fairly well controlled Hopefully not from tarceva--going back to Dr Grayland Ormond now

## 2015-09-14 ENCOUNTER — Inpatient Hospital Stay: Payer: Medicare Other

## 2015-09-14 ENCOUNTER — Ambulatory Visit
Admission: RE | Admit: 2015-09-14 | Discharge: 2015-09-14 | Disposition: A | Discharge status: Home / Self Care | Payer: Medicare Other | Source: Ambulatory Visit | Attending: Radiation Oncology | Admitting: Radiation Oncology

## 2015-09-14 ENCOUNTER — Encounter: Payer: Self-pay | Admitting: Radiation Oncology

## 2015-09-14 VITALS — BP 130/78 | HR 86 | Temp 97.2°F | Wt 187.1 lb

## 2015-09-14 DIAGNOSIS — C3412 Malignant neoplasm of upper lobe, left bronchus or lung: Secondary | ICD-10-CM

## 2015-09-14 NOTE — Progress Notes (Signed)
Radiation Oncology Follow up Note  Name: Christopher Huber   Date:   09/14/2015 MRN:  [id number] DOB: 12-30-1945    This 69 y.o. male presents to the clinic today for follow-up for right upper lobe SB RT.  REFERRING PROVIDER: Adrian Prows, MD  HPI: Patient is a 69 year old male well-known to department having completed treatments over 2 years prior for stage IIIB out mutation negative non-small cell lung cancer for which he is done extremely well. 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 He is now 1 month out of SB RT treatment. He is doing fairly well has a dry hacking cough and some hoarseness. He specifically denies any significant change in pulmonary status hemoptysis or chest tightness. He had a chest x-ray about a week ago showing good tumor response in the right upper lobe..  COMPLICATIONS OF TREATMENT: none  FOLLOW UP COMPLIANCE: keeps appointments   PHYSICAL EXAM:  BP 130/78 mmHg  Pulse 86  Temp(Src) 97.2 F (36.2 C)  Wt 187 lb 1 oz (84.85 kg) Well-developed well-nourished patient in NAD. HEENT reveals PERLA, EOMI, discs not visualized.  Oral cavity is clear. No oral mucosal lesions are identified. Neck is clear without evidence of cervical or supraclavicular adenopathy. Lungs are clear to A&P. Cardiac examination is essentially unremarkable with regular rate and rhythm without murmur rub or thrill. Abdomen is benign with no organomegaly or masses noted. Motor sensory and DTR levels are equal and symmetric in the upper and lower extremities. Cranial nerves II through XII are grossly intact. Proprioception is intact. No peripheral adenopathy or edema is identified. No motor or sensory levels are noted. Crude visual fields are within normal range.  RADIOLOGY RESULTS: Plain film of his chest is reviewed compatible above-stated findings  PLAN:  Present time he is doing well I have suggested some Mucinex to help break up some of his dry cough. Also to drink plenty of fluids. He scheduled for CT scan at the end of November and I will review that when it becomes available. He otherwise is doing extremely well. I have asked to see him back in 3-4 months for follow-up. Patient knows to call sooner with any concerns.  I would like to take this opportunity for allowing me to participate in the care of your patient.Armstead Peaks., MD

## 2015-09-21 ENCOUNTER — Inpatient Hospital Stay: Payer: Medicare Other | Attending: Oncology

## 2015-09-21 DIAGNOSIS — J189 Pneumonia, unspecified organism: Secondary | ICD-10-CM | POA: Diagnosis not present

## 2015-09-21 DIAGNOSIS — C3411 Malignant neoplasm of upper lobe, right bronchus or lung: Secondary | ICD-10-CM | POA: Diagnosis not present

## 2015-09-21 DIAGNOSIS — R21 Rash and other nonspecific skin eruption: Secondary | ICD-10-CM | POA: Diagnosis not present

## 2015-09-21 DIAGNOSIS — Z79899 Other long term (current) drug therapy: Secondary | ICD-10-CM | POA: Insufficient documentation

## 2015-09-21 DIAGNOSIS — K219 Gastro-esophageal reflux disease without esophagitis: Secondary | ICD-10-CM | POA: Insufficient documentation

## 2015-09-21 DIAGNOSIS — Z9221 Personal history of antineoplastic chemotherapy: Secondary | ICD-10-CM | POA: Insufficient documentation

## 2015-09-21 DIAGNOSIS — Z87891 Personal history of nicotine dependence: Secondary | ICD-10-CM | POA: Diagnosis not present

## 2015-09-21 DIAGNOSIS — R911 Solitary pulmonary nodule: Secondary | ICD-10-CM | POA: Diagnosis not present

## 2015-09-21 DIAGNOSIS — M1990 Unspecified osteoarthritis, unspecified site: Secondary | ICD-10-CM | POA: Insufficient documentation

## 2015-09-21 DIAGNOSIS — Z452 Encounter for adjustment and management of vascular access device: Secondary | ICD-10-CM | POA: Insufficient documentation

## 2015-09-21 DIAGNOSIS — C801 Malignant (primary) neoplasm, unspecified: Secondary | ICD-10-CM

## 2015-09-21 MED ORDER — SODIUM CHLORIDE 0.9 % IJ SOLN
10.0000 mL | INTRAMUSCULAR | Status: DC | PRN
  Administered 2015-09-21: 10 mL
  Filled 2015-09-21: qty 10

## 2015-09-21 MED ORDER — HEPARIN SOD (PORK) LOCK FLUSH 100 UNIT/ML IV SOLN
500.0000 [IU] | Freq: Once | INTRAVENOUS | Status: AC
  Administered 2015-09-21: 500 [IU] via INTRAVENOUS
  Filled 2015-09-21: qty 5

## 2015-09-23 ENCOUNTER — Emergency Department: Payer: Medicare Other

## 2015-09-23 ENCOUNTER — Encounter: Payer: Self-pay | Admitting: Emergency Medicine

## 2015-09-23 ENCOUNTER — Emergency Department
Admission: EM | Admit: 2015-09-23 | Discharge: 2015-09-23 | Disposition: A | Discharge status: Home / Self Care | Payer: Medicare Other | Attending: Emergency Medicine | Admitting: Emergency Medicine

## 2015-09-23 DIAGNOSIS — M25512 Pain in left shoulder: Secondary | ICD-10-CM | POA: Diagnosis not present

## 2015-09-23 DIAGNOSIS — C3490 Malignant neoplasm of unspecified part of unspecified bronchus or lung: Secondary | ICD-10-CM | POA: Diagnosis not present

## 2015-09-23 DIAGNOSIS — M1990 Unspecified osteoarthritis, unspecified site: Secondary | ICD-10-CM | POA: Diagnosis not present

## 2015-09-23 DIAGNOSIS — Z452 Encounter for adjustment and management of vascular access device: Secondary | ICD-10-CM | POA: Diagnosis not present

## 2015-09-23 DIAGNOSIS — K219 Gastro-esophageal reflux disease without esophagitis: Secondary | ICD-10-CM | POA: Diagnosis not present

## 2015-09-23 DIAGNOSIS — J181 Lobar pneumonia, unspecified organism: Secondary | ICD-10-CM | POA: Diagnosis not present

## 2015-09-23 DIAGNOSIS — Z79899 Other long term (current) drug therapy: Secondary | ICD-10-CM | POA: Diagnosis not present

## 2015-09-23 DIAGNOSIS — C3411 Malignant neoplasm of upper lobe, right bronchus or lung: Secondary | ICD-10-CM | POA: Diagnosis not present

## 2015-09-23 DIAGNOSIS — J189 Pneumonia, unspecified organism: Secondary | ICD-10-CM | POA: Diagnosis not present

## 2015-09-23 DIAGNOSIS — Z9221 Personal history of antineoplastic chemotherapy: Secondary | ICD-10-CM | POA: Diagnosis not present

## 2015-09-23 DIAGNOSIS — R911 Solitary pulmonary nodule: Secondary | ICD-10-CM | POA: Diagnosis not present

## 2015-09-23 DIAGNOSIS — R0789 Other chest pain: Secondary | ICD-10-CM | POA: Diagnosis not present

## 2015-09-23 DIAGNOSIS — R079 Chest pain, unspecified: Secondary | ICD-10-CM

## 2015-09-23 DIAGNOSIS — Z87891 Personal history of nicotine dependence: Secondary | ICD-10-CM | POA: Diagnosis not present

## 2015-09-23 DIAGNOSIS — R21 Rash and other nonspecific skin eruption: Secondary | ICD-10-CM | POA: Diagnosis not present

## 2015-09-23 LAB — CBC
HCT: 43.6 % (ref 40.0–52.0)
HEMOGLOBIN: 14.6 g/dL (ref 13.0–18.0)
MCH: 27.7 pg (ref 26.0–34.0)
MCHC: 33.4 g/dL (ref 32.0–36.0)
MCV: 83 fL (ref 80.0–100.0)
Platelets: 337 10*3/uL (ref 150–440)
RBC: 5.25 MIL/uL (ref 4.40–5.90)
RDW: 14 % (ref 11.5–14.5)
WBC: 5.1 10*3/uL (ref 3.8–10.6)

## 2015-09-23 LAB — BASIC METABOLIC PANEL
ANION GAP: 8 (ref 5–15)
BUN: 9 mg/dL (ref 6–20)
CALCIUM: 9.5 mg/dL (ref 8.9–10.3)
CO2: 26 mmol/L (ref 22–32)
Chloride: 103 mmol/L (ref 101–111)
Creatinine, Ser: 0.8 mg/dL (ref 0.61–1.24)
GFR calc Af Amer: >60 mL/min (ref >60)
GLUCOSE: 109 mg/dL — AB (ref 65–99)
Potassium: 4 mmol/L (ref 3.5–5.1)
SODIUM: 137 mmol/L (ref 135–145)

## 2015-09-23 LAB — TROPONIN I

## 2015-09-23 MED ORDER — ASPIRIN 81 MG PO CHEW
162.0000 mg | CHEWABLE_TABLET | Freq: Once | ORAL | Status: AC
  Administered 2015-09-23: 162 mg via ORAL
  Filled 2015-09-23: qty 2

## 2015-09-23 MED ORDER — LEVOFLOXACIN 750 MG PO TABS
750.0000 mg | ORAL_TABLET | Freq: Once | ORAL | Status: AC
  Administered 2015-09-23: 750 mg via ORAL
  Filled 2015-09-23: qty 1

## 2015-09-23 MED ORDER — ONDANSETRON HCL 4 MG/2ML IJ SOLN
4.0000 mg | Freq: Once | INTRAMUSCULAR | Status: AC
  Administered 2015-09-23: 4 mg via INTRAVENOUS

## 2015-09-23 MED ORDER — MORPHINE SULFATE (PF) 4 MG/ML IV SOLN
4.0000 mg | Freq: Once | INTRAVENOUS | Status: AC
  Administered 2015-09-23: 4 mg via INTRAVENOUS

## 2015-09-23 MED ORDER — MORPHINE SULFATE (PF) 4 MG/ML IV SOLN
INTRAVENOUS | Status: AC
  Administered 2015-09-23: 4 mg via INTRAVENOUS
  Filled 2015-09-23: qty 1

## 2015-09-23 MED ORDER — ONDANSETRON HCL 4 MG/2ML IJ SOLN
INTRAMUSCULAR | Status: AC
  Administered 2015-09-23: 4 mg via INTRAVENOUS
  Filled 2015-09-23: qty 2

## 2015-09-23 MED ORDER — HYDROCODONE-ACETAMINOPHEN 5-325 MG PO TABS: 1.0000 | ORAL_TABLET | ORAL | Status: DC | PRN

## 2015-09-23 MED ORDER — IOHEXOL 350 MG/ML SOLN
75.0000 mL | Freq: Once | INTRAVENOUS | Status: AC | PRN
  Administered 2015-09-23: 75 mL via INTRAVENOUS

## 2015-09-23 MED ORDER — LEVOFLOXACIN 500 MG PO TABS: 500.0000 mg | ORAL_TABLET | Freq: Every day | ORAL | Status: AC

## 2015-09-23 NOTE — Discharge Instructions (Signed)
As we discussed, your workup was generally reassuring except for an area in your left lung that may represent a pneumonia.  You also have several nodules that are concerning for worsening cancer -- although we cannot be sure about a definitive diagnosis yet, it is very important that you follow up with Dr. Grayland Ormond tomorrow for the next available appointment.  Please take the antibiotics as prescribed, the pain medication as needed, and return to the Emergency Department if you develop new or worsening symptoms that concern you.

## 2015-09-23 NOTE — ED Notes (Signed)
Spoke to Dr. Karma Greaser about pain and verbal order obtained for morphine and zofran.

## 2015-09-23 NOTE — ED Notes (Signed)
Patient comes with pain to left lateral chest pain that radiates to back.  Pain started around 7am.  Patient denies any bloody sputum with cough. Patient with history of left lung CA diagnosed 01/2013 s/p chemo that went into remission.  Recently patient treated for lung CA to right lung s/p radiation.  Patient of Dr. Kennith Gain (oncology).

## 2015-09-23 NOTE — ED Notes (Signed)
Pt reports waking up at 0700 a sharp pain to left shoulder blade. Pt reports increased pain. States he took 2 Asprin 81 mg. Pt also reports dizziness.

## 2015-09-23 NOTE — ED Provider Notes (Signed)
[facility] Emergency Department Provider Note  ____________________________________________  Time seen: Approximately 8:27 AM  I have reviewed the triage vital signs and the nursing notes.   HISTORY  Chief Complaint Chest Pain and Shoulder Pain    HPI Christopher Huber is a 69 y.o. male with a history of lung cancer status post bilateral radiation treatments (most recent was on the right upper lobe) and who takes Tarceva daily and is under the care of Dr. Grayland Ormond.  He presents today with acute onset of left sided chest pain that radiates through to his back.  The pain started around 7 AM acutely while he was lying in bed.  The pain has made it difficult to breathe but he does not specifically complain of shortness of breath.  The pain has been constant since it began about an hour and a half ago.  He has never had similar pain in the past.  The pain is severe.  He denies nausea/vomiting, fever/chills, abdominal pain, diaphoresis.He denies leg pain and swelling as well.   Past Medical History  Diagnosis Date  . Lung cancer ([place])   . Arthritis     right arm  . GERD (gastroesophageal reflux disease)     Patient Active Problem List   Diagnosis Date Noted  . Cough 09/11/2015  . GERD (gastroesophageal reflux disease)   . Lung cancer ([place]) 06/18/2015    Past Surgical History  Procedure Laterality Date  . Colonoscopy    . Esophagogastroduodenoscopy    . Left shoulder reconstruction  ~1965    after hunting accident    Current Outpatient Rx  Name  Route  Sig  Dispense  Refill  . Ascorbic Acid (VITAMIN C) 1000 MG tablet   Oral   Take 1,000 mg by mouth daily.         . benzonatate (TESSALON) 100 MG capsule   Oral   Take 1 capsule (100 mg total) by mouth 3 (three) times daily as needed for cough.   30 capsule   0   . Cholecalciferol (VITAMIN D) 2000 UNITS CAPS   Oral   Take 1 capsule by mouth daily.         Marland Kitchen erlotinib (TARCEVA) 25 MG tablet    Oral   Take 2 tablets (50 mg total) by mouth daily. Take on an empty stomach 1 hour before meals or 2 hours after.   60 tablet   0   .           . magnesium oxide (MAG-OX) 400 MG tablet   Oral   Take 400 mg by mouth daily.         . Multiple Vitamin (MULTIVITAMIN WITH MINERALS) TABS   Oral   Take 1 tablet by mouth daily.         . ranitidine (ZANTAC) 150 MG tablet      TAKE 1 TABLET BY MOUTH TWICE A DAY   60 tablet   11   . vitamin E 400 UNIT capsule   Oral   Take 400 Units by mouth daily.           Allergies Codeine  Family History  Problem Relation Age of Onset  . Heart disease Father   . Diabetes Neg Hx   . Cancer Neg Hx     Social History Social History  Substance Use Topics  . Smoking status: Former Smoker    Quit date: 06/27/1997  . Smokeless tobacco: Never Used  . Alcohol  Use: No    Review of Systems Constitutional: No fever/chills Eyes: No visual changes. ENT: No sore throat. Cardiovascular: Acute left-sided chest pain through to his back Respiratory: I will shortness of breath along with the chest pain Gastrointestinal: No abdominal pain.  No nausea, no vomiting.  No diarrhea.  No constipation. Genitourinary: Negative for dysuria. Musculoskeletal: Referred back pain from the left side of his chest Skin: Negative for rash. Neurological: Negative for headaches, focal weakness or numbness.  10-point ROS otherwise negative.  ____________________________________________   PHYSICAL EXAM:  VITAL SIGNS: ED Triage Vitals  Enc Vitals Group     BP 09/23/15 0759 130/110 mmHg     Pulse Rate 09/23/15 0759 88     Resp 09/23/15 0759 22     Temp 09/23/15 0759 97.7 F (36.5 C)     Temp Source 09/23/15 0759 Oral     SpO2 09/23/15 0759 100 %     Weight 09/23/15 0759 185 lb (83.915 kg)     Height 09/23/15 0759 '5\' 9"'$  (1.753 m)     Head Cir --      Peak Flow --      Pain Score 09/23/15 0802 10     Pain Loc --      Pain Edu? --      Excl. in [place]?  --     Constitutional: Alert and oriented. Well appearing and in no acute distress. Eyes: Conjunctivae are normal. PERRL. EOMI. Head: Atraumatic. Nose: No congestion/rhinnorhea. Mouth/Throat: Mucous membranes are moist.  Oropharynx non-erythematous. Neck: No stridor.   Cardiovascular: Normal rate, regular rhythm. Grossly normal heart sounds.  Good peripheral circulation. Respiratory: Normal respiratory effort.  No retractions.  Mild Coarse breath sounds throughout.  No wheezing. Gastrointestinal: Soft and nontender. No distention. No abdominal bruits. No CVA tenderness. Musculoskeletal: No lower extremity tenderness nor edema.  No joint effusions. Neurologic:  Normal speech and language. No gross focal neurologic deficits are appreciated.  Skin:  Skin is warm, dry and intact. No rash noted. Psychiatric: Mood and affect are normal. Speech and behavior are normal.  ____________________________________________   LABS (all labs ordered are listed, but only abnormal results are displayed)  Labs Reviewed  BASIC METABOLIC PANEL - Abnormal; Notable for the following:    Glucose, Bld 109 (*)    All other components within normal limits  CBC  TROPONIN I  TROPONIN I   ____________________________________________  EKG  ED ECG REPORT I, Kaimana Neuzil, the attending physician, personally viewed and interpreted this ECG.  Date: 09/23/2015 EKG Time: 7:56 Rate: 88 Rhythm: normal sinus rhythm QRS Axis: normal Intervals: normal ST/T Wave abnormalities: normal Conduction Disutrbances: none Narrative Interpretation: unremarkable  ____________________________________________  RADIOLOGY   Dg Chest 2 View  09/23/2015  CLINICAL DATA:  69 year old male with left-sided chest pain today. Undergoing treatment for lung cancer. EXAM: CHEST  2 VIEW COMPARISON:  Chest x-ray 09/11/2015. FINDINGS: Extensive right apical nodularity and bilateral apical architectural distortion, similar to prior  examinations. Small calcified granuloma in the right mid lung. No confluent consolidative airspace disease. No pleural effusions. No evidence of pulmonary edema. Heart size is normal. Mediastinal contours are unremarkable. Right internal jugular single-lumen porta cath with tip terminating at the superior cavoatrial junction. IMPRESSION: 1. No radiographic evidence of acute cardiopulmonary disease. The appearance the chest is very similar prior studies, as discussed above. Electronically Signed   By: Vinnie Langton M.D.   On: 09/23/2015 08:27   Ct Angio Chest Pe W/cm &/or Wo Cm  09/23/2015  CLINICAL DATA:  Left side chest pain, lung cancer EXAM: CT ANGIOGRAPHY CHEST WITH CONTRAST TECHNIQUE: Multidetector CT imaging of the chest was performed using the standard protocol during bolus administration of intravenous contrast. Multiplanar CT image reconstructions and MIPs were obtained to evaluate the vascular anatomy. CONTRAST:  38m OMNIPAQUE IOHEXOL 350 MG/ML SOLN COMPARISON:  06/28/2015 FINDINGS: Sagittal images of the spine shows osteopenia and degenerative changes thoracic spine. The visualized upper abdomen shows no adrenal gland mass. No destructive rib lesions are noted. No pulmonary embolus is noted. Heart size within normal limits. Trace anterior pericardial effusion. Again noted spiculated mass in right upper lobe measures 3.2 by 2.7 cm decreased in size from prior exam. There is adjacent some triangular-shaped consolidation. This may be due to scarring or tumor extension. Correlation with PET scan is recommended. There is a 7 mm nodule in right upper lobe medially highly suspicious for metastatic disease. There is a spiculated lesion in right suprahilar region/ right upper lobe measures 1.5 cm. This is highly suspicious for metastatic disease. There is a nodule in superior segment of right lower lobe posteriorly measures 6 mm axial image 41 metastatic disease cannot be excluded. There is dense  consolidation in left upper lobe suprahilar region posterior medially with air bronchogram. This is highly suspicious for pneumonia. Postradiation pneumonitis cannot be excluded. Clinical correlation is necessary. Atherosclerotic calcifications of thoracic aorta are noted. Bilateral perihilar peribronchial thickening suspicious for bronchitic changes. A right hilar lymph node measures 1.2 cm borderline by size criteria. No significant mediastinal adenopathy. There is no pulmonary edema. Review of the MIP images confirms the above findings. IMPRESSION: 1. No pulmonary embolus is noted. Borderline enlarged right hilar lymph node. No mediastinal adenopathy. 2. Again noted mass in right upper lobe measures 3.2 x 2.7 cm slight decrease in size from prior exam. There is adjacent triangular-shaped consolidation in right upper lobe see axial image 20. Tumor extension or scarring cannot be excluded. Correlation with PET scan is recommended. 3. There are pulmonary nodules in right upper lobe and right lower lobe as described above. The largest in right upper lobe suprahilar region measures 1.5 cm. Metastatic disease cannot be excluded. Correlation with PET scan is recommended. 4. There is consolidation with air bronchogram in left upper lobe posterior medially. This is highly suspicious for pneumonia. Radiation pneumonitis cannot be excluded. Clinical correlation is necessary. 5. No bony metastatic disease. Electronically Signed   By: LLahoma CrockerM.D.   On: 09/23/2015 09:47    ____________________________________________   PROCEDURES  Procedure(s) performed: None  Critical Care performed: No ____________________________________________   INITIAL IMPRESSION / ASSESSMENT AND PLAN / ED COURSE  Pertinent labs & imaging results that were available during my care of the patient were reviewed by me and considered in my medical decision making (see chart for details).  Given the patient's cancer history and his  history of present illness, I immediately obtained a CT angio chest to rule out pulmonary embolism.  There was no evidence of pulmonary embolism on his scan, but his scan was worrisome for the possibility of a left upper lobe pneumonia which is in the general location of the pain he is experiencing, as well as multiple nodules that are concerning for metastatic disease.  I discussed the patient's CT scan result with him and his family in detail and explained that the results are concerning though nothing is certain at this time.  I spoke by phone with Dr. CMike Gip the physician covering for Dr. FGrayland Ormondtoday.  We also discussed the case in detail and decided on the plan to treat the patient with Levaquin empirically and have him follow up tomorrow with Dr. Grayland Ormond.  She is going to send a note to Dr. Grayland Ormond and make sure that the patient is able to be seen at the next available opportunity.  In the emergency department the patient is comfortable and has not had any additional episodes of pain.  He and his family are comfortable with the plan.  He has had 2 negative troponins.  They understand that he should return immediately to the emergency department if he develops any new or worsening symptoms.  ____________________________________________  FINAL CLINICAL IMPRESSION(S) / ED DIAGNOSES  Final diagnoses:  Left upper lobe pneumonia  Chest pain, unspecified chest pain type      NEW MEDICATIONS STARTED DURING THIS VISIT:  New Prescriptions   HYDROCODONE-ACETAMINOPHEN (NORCO/VICODIN) 5-325 MG TABLET    Take 1-2 tablets by mouth every 4 (four) hours as needed for moderate pain.   LEVOFLOXACIN (LEVAQUIN) 500 MG TABLET    Take 1 tablet (500 mg total) by mouth daily.     Hinda Kehr, MD 09/23/15 [phone]

## 2015-09-25 ENCOUNTER — Telehealth: Payer: Self-pay | Admitting: *Deleted

## 2015-09-25 NOTE — Telephone Encounter (Signed)
Spoke with patient and he's doing better since being on the medication, pt has follow-up with oncology on Monday, they wanted him to wait until the medication has a chance to work.

## 2015-09-25 NOTE — Telephone Encounter (Signed)
Okay, that sounds good

## 2015-09-25 NOTE — Telephone Encounter (Signed)
Pt returned your call- please call either cell or home  Cell - [place] Thank you

## 2015-09-25 NOTE — Telephone Encounter (Signed)
.  left message to have patient return my call.  

## 2015-10-01 ENCOUNTER — Inpatient Hospital Stay (HOSPITAL_BASED_OUTPATIENT_CLINIC_OR_DEPARTMENT_OTHER): Payer: Medicare Other | Admitting: Oncology

## 2015-10-01 VITALS — BP 107/62 | HR 59 | Temp 96.0°F | Resp 16 | Wt 187.8 lb

## 2015-10-01 DIAGNOSIS — Z9221 Personal history of antineoplastic chemotherapy: Secondary | ICD-10-CM

## 2015-10-01 DIAGNOSIS — K219 Gastro-esophageal reflux disease without esophagitis: Secondary | ICD-10-CM | POA: Diagnosis not present

## 2015-10-01 DIAGNOSIS — M1990 Unspecified osteoarthritis, unspecified site: Secondary | ICD-10-CM

## 2015-10-01 DIAGNOSIS — C3411 Malignant neoplasm of upper lobe, right bronchus or lung: Secondary | ICD-10-CM | POA: Diagnosis not present

## 2015-10-01 DIAGNOSIS — Z79899 Other long term (current) drug therapy: Secondary | ICD-10-CM | POA: Diagnosis not present

## 2015-10-01 DIAGNOSIS — R911 Solitary pulmonary nodule: Secondary | ICD-10-CM

## 2015-10-01 DIAGNOSIS — R21 Rash and other nonspecific skin eruption: Secondary | ICD-10-CM

## 2015-10-01 DIAGNOSIS — J189 Pneumonia, unspecified organism: Secondary | ICD-10-CM | POA: Diagnosis not present

## 2015-10-01 DIAGNOSIS — Z87891 Personal history of nicotine dependence: Secondary | ICD-10-CM

## 2015-10-01 DIAGNOSIS — Z452 Encounter for adjustment and management of vascular access device: Secondary | ICD-10-CM | POA: Diagnosis not present

## 2015-10-01 NOTE — Progress Notes (Signed)
Patient presented to the ER 1 week ago with left side back pain that radiated down left arm and left side of chest.  He was cleared for any cardiac issues and was treated for pneumonia.

## 2015-10-05 ENCOUNTER — Ambulatory Visit: Payer: Medicare Other

## 2015-10-08 NOTE — Progress Notes (Signed)
[name]  [phone] [phone]  ID: Christopher Huber OB: 04-15-1946  MR#: [id number]  JJK#:[id number]  Patient Care Team: Venia Carbon, MD as PCP - General (Internal Medicine)  CHIEF COMPLAINT:  Chief Complaint  Patient presents with  . Lung Cancer    INTERVAL HISTORY: Patient returns to clinic today as an add-on after presenting to the emergency room with left sided pain radiating down his left arm. Patient was cleared for any cardiac etiology and thought to have pneumonia. There is also concern of possible progression of disease. Currently, he feels well and is back to his baseline. He has no neurologic complaints.  He has a good appetite and denies weight loss.  He denies any fevers.  He has no further chest pain, shortness of breath, cough, or hemoptysis.  He has no nausea, vomiting, constipation, or diarrhea.  Patient offers no specific complaints today.   REVIEW OF SYSTEMS:   Review of Systems  Constitutional: Negative.   Respiratory: Negative.   Cardiovascular: Negative.  Negative for chest pain.  Gastrointestinal: Negative.   Musculoskeletal: Negative.   Skin: Positive for rash.  Neurological: Negative.     As per HPI. Otherwise, a complete review of systems is negatve.  PAST MEDICAL HISTORY: Past Medical History  Diagnosis Date  . Lung cancer ([place])   . Arthritis     right arm  . GERD (gastroesophageal reflux disease)     PAST SURGICAL HISTORY: Past Surgical History  Procedure Laterality Date  . Colonoscopy    . Esophagogastroduodenoscopy    . Left shoulder reconstruction  ~1965    after hunting accident    FAMILY HISTORY Family History  Problem Relation Age of Onset  . Heart disease Father   . Diabetes Neg Hx   . Cancer Neg Hx        ADVANCED DIRECTIVES:    HEALTH MAINTENANCE: Social History  Substance Use Topics  . Smoking status: Former Smoker    Quit date: 06/27/1997  . Smokeless tobacco: Never  Used  . Alcohol Use: No     Colonoscopy:  PAP:  Bone density:  Lipid panel:  Allergies  Allergen Reactions  . Codeine Nausea And Vomiting    Dizzy     Current Outpatient Prescriptions  Medication Sig Dispense Refill  . Ascorbic Acid (VITAMIN C) 1000 MG tablet Take 1,000 mg by mouth daily.    . benzonatate (TESSALON) 100 MG capsule Take 1 capsule (100 mg total) by mouth 3 (three) times daily as needed for cough. 30 capsule 0  . Cholecalciferol (VITAMIN D) 2000 UNITS CAPS Take 1 capsule by mouth daily.    Marland Kitchen erlotinib (TARCEVA) 25 MG tablet Take 2 tablets (50 mg total) by mouth daily. Take on an empty stomach 1 hour before meals or 2 hours after. 60 tablet 0  . magnesium oxide (MAG-OX) 400 MG tablet Take 400 mg by mouth daily.    . Multiple Vitamin (MULTIVITAMIN WITH MINERALS) TABS Take 1 tablet by mouth daily.    . ranitidine (ZANTAC) 150 MG tablet TAKE 1 TABLET BY MOUTH TWICE A DAY 60 tablet 11  . vitamin E 400 UNIT capsule Take 400 Units by mouth daily.     No current facility-administered medications for this visit.    OBJECTIVE: Filed Vitals:   10/01/15 1206  BP: 107/62  Pulse: 59  Temp: 96 F (35.6 C)  Resp: 16     Body mass index is 27.73 kg/(m^2).    ECOG  FS:0 - Asymptomatic  General: Well-developed, well-nourished, no acute distress. Eyes: anicteric sclera. Lungs: Clear to auscultation bilaterally. Heart: Regular rate and rhythm. No rubs, murmurs, or gallops. Abdomen: Soft, nontender, nondistended. No organomegaly noted, normoactive bowel sounds. Musculoskeletal: No edema, cyanosis, or clubbing. Neuro: Alert, answering all questions appropriately. Cranial nerves grossly intact. Skin: No rashes or petechiae noted. Psych: Normal affect.  LAB RESULTS:  Lab Results  Component Value Date   NA 137 09/23/2015   K 4.0 09/23/2015   CL 103 09/23/2015   CO2 26 09/23/2015   GLUCOSE 109* 09/23/2015   BUN 9 09/23/2015   CREATININE 0.80 09/23/2015   CALCIUM 9.5  09/23/2015   PROT 6.8 08/01/2015   ALBUMIN 3.9 08/01/2015   AST 21 08/01/2015   ALT 14* 08/01/2015   ALKPHOS 81 08/01/2015   BILITOT 0.5 08/01/2015   GFRNONAA >60 09/23/2015   GFRAA >60 09/23/2015    Lab Results  Component Value Date   WBC 5.1 09/23/2015   NEUTROABS 5.0 03/16/2015   HGB 14.6 09/23/2015   HCT 43.6 09/23/2015   MCV 83.0 09/23/2015   PLT 337 09/23/2015     STUDIES: Dg Chest 2 View  09/23/2015  CLINICAL DATA:  69 year old male with left-sided chest pain today. Undergoing treatment for lung cancer. EXAM: CHEST  2 VIEW COMPARISON:  Chest x-ray 09/11/2015. FINDINGS: Extensive right apical nodularity and bilateral apical architectural distortion, similar to prior examinations. Small calcified granuloma in the right mid lung. No confluent consolidative airspace disease. No pleural effusions. No evidence of pulmonary edema. Heart size is normal. Mediastinal contours are unremarkable. Right internal jugular single-lumen porta cath with tip terminating at the superior cavoatrial junction. IMPRESSION: 1. No radiographic evidence of acute cardiopulmonary disease. The appearance the chest is very similar prior studies, as discussed above. Electronically Signed   By: Vinnie Langton M.D.   On: 09/23/2015 08:27   Dg Chest 2 View  09/12/2015  CLINICAL DATA:  Cough. EXAM: CHEST  2 VIEW COMPARISON:  06/28/2015 .  CT 06/19/2015. FINDINGS: Port-A-Cath noted in good anatomic position. Mediastinum hilar structures are normal. Heart size stable. Previously identified right apical mass is diminished in size. Biapical pleural-parenchymal thickening noted consistent with scarring. No pleural effusion or pneumothorax. No acute bony abnormality . IMPRESSION: 1. Port-A-Cath in stable position. 2. Previously identified right apical mass has decreased in size consistent with response to therapy. Mild residual. Electronically Signed   By: [place]   On: 09/12/2015 08:49   Ct Angio Chest Pe W/cm  &/or Wo Cm  09/23/2015  CLINICAL DATA:  Left side chest pain, lung cancer EXAM: CT ANGIOGRAPHY CHEST WITH CONTRAST TECHNIQUE: Multidetector CT imaging of the chest was performed using the standard protocol during bolus administration of intravenous contrast. Multiplanar CT image reconstructions and MIPs were obtained to evaluate the vascular anatomy. CONTRAST:  32m OMNIPAQUE IOHEXOL 350 MG/ML SOLN COMPARISON:  06/28/2015 FINDINGS: Sagittal images of the spine shows osteopenia and degenerative changes thoracic spine. The visualized upper abdomen shows no adrenal gland mass. No destructive rib lesions are noted. No pulmonary embolus is noted. Heart size within normal limits. Trace anterior pericardial effusion. Again noted spiculated mass in right upper lobe measures 3.2 by 2.7 cm decreased in size from prior exam. There is adjacent some triangular-shaped consolidation. This may be due to scarring or tumor extension. Correlation with PET scan is recommended. There is a 7 mm nodule in right upper lobe medially highly suspicious for metastatic disease. There is a spiculated lesion in  right suprahilar region/ right upper lobe measures 1.5 cm. This is highly suspicious for metastatic disease. There is a nodule in superior segment of right lower lobe posteriorly measures 6 mm axial image 41 metastatic disease cannot be excluded. There is dense consolidation in left upper lobe suprahilar region posterior medially with air bronchogram. This is highly suspicious for pneumonia. Postradiation pneumonitis cannot be excluded. Clinical correlation is necessary. Atherosclerotic calcifications of thoracic aorta are noted. Bilateral perihilar peribronchial thickening suspicious for bronchitic changes. A right hilar lymph node measures 1.2 cm borderline by size criteria. No significant mediastinal adenopathy. There is no pulmonary edema. Review of the MIP images confirms the above findings. IMPRESSION: 1. No pulmonary embolus is  noted. Borderline enlarged right hilar lymph node. No mediastinal adenopathy. 2. Again noted mass in right upper lobe measures 3.2 x 2.7 cm slight decrease in size from prior exam. There is adjacent triangular-shaped consolidation in right upper lobe see axial image 20. Tumor extension or scarring cannot be excluded. Correlation with PET scan is recommended. 3. There are pulmonary nodules in right upper lobe and right lower lobe as described above. The largest in right upper lobe suprahilar region measures 1.5 cm. Metastatic disease cannot be excluded. Correlation with PET scan is recommended. 4. There is consolidation with air bronchogram in left upper lobe posterior medially. This is highly suspicious for pneumonia. Radiation pneumonitis cannot be excluded. Clinical correlation is necessary. 5. No bony metastatic disease. Electronically Signed   By: Lahoma Crocker M.D.   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  PLAN:    1.  Lung cancer:  Patient completed XRT to his new right upper lobe lesion. CT scan results reviewed independently and reported as above. Patient has been instructed to continue 50 mg Tarceva daily. Will restage with PET scan at the end of November to assess whether this is true progression of disease or radiation changes/pneumonia. No further intervention is needed at this time. Patient will return to clinic several days after his scan to discuss the results.  2.  Rash:  Secondary to Tarceva.  Continue hydrocortisone cream as needed. 3.  Hypomagnesemia: Resolved. 4. Pneumonia: Continue antibiotics as prescribed.  Approximately 30 minutes was spent in discussion and consultation.  Patient expressed understanding and was in agreement with this plan. He also understands that He can call clinic at any time with any questions, concerns, or complaints.    Christopher Huger, MD   10/08/2015 8:58 AM

## 2015-11-05 ENCOUNTER — Inpatient Hospital Stay: Payer: Medicare Other

## 2015-11-05 ENCOUNTER — Encounter
Admission: RE | Admit: 2015-11-05 | Discharge: 2015-11-05 | Disposition: A | Discharge status: Home / Self Care | Payer: Medicare Other | Source: Ambulatory Visit | Attending: Oncology | Admitting: Oncology

## 2015-11-05 ENCOUNTER — Inpatient Hospital Stay: Payer: Medicare Other | Attending: Oncology

## 2015-11-05 ENCOUNTER — Other Ambulatory Visit: Payer: Self-pay

## 2015-11-05 ENCOUNTER — Ambulatory Visit: Payer: Medicare Other

## 2015-11-05 DIAGNOSIS — L270 Generalized skin eruption due to drugs and medicaments taken internally: Secondary | ICD-10-CM | POA: Diagnosis not present

## 2015-11-05 DIAGNOSIS — Z923 Personal history of irradiation: Secondary | ICD-10-CM | POA: Insufficient documentation

## 2015-11-05 DIAGNOSIS — T451X5S Adverse effect of antineoplastic and immunosuppressive drugs, sequela: Secondary | ICD-10-CM | POA: Diagnosis not present

## 2015-11-05 DIAGNOSIS — K219 Gastro-esophageal reflux disease without esophagitis: Secondary | ICD-10-CM | POA: Diagnosis not present

## 2015-11-05 DIAGNOSIS — Z79899 Other long term (current) drug therapy: Secondary | ICD-10-CM | POA: Diagnosis not present

## 2015-11-05 DIAGNOSIS — M1990 Unspecified osteoarthritis, unspecified site: Secondary | ICD-10-CM | POA: Insufficient documentation

## 2015-11-05 DIAGNOSIS — C3411 Malignant neoplasm of upper lobe, right bronchus or lung: Secondary | ICD-10-CM | POA: Insufficient documentation

## 2015-11-05 DIAGNOSIS — R590 Localized enlarged lymph nodes: Secondary | ICD-10-CM | POA: Diagnosis not present

## 2015-11-05 DIAGNOSIS — Z87891 Personal history of nicotine dependence: Secondary | ICD-10-CM | POA: Diagnosis not present

## 2015-11-05 DIAGNOSIS — C3491 Malignant neoplasm of unspecified part of right bronchus or lung: Secondary | ICD-10-CM

## 2015-11-05 DIAGNOSIS — C3492 Malignant neoplasm of unspecified part of left bronchus or lung: Principal | ICD-10-CM

## 2015-11-05 DIAGNOSIS — Z9221 Personal history of antineoplastic chemotherapy: Secondary | ICD-10-CM | POA: Insufficient documentation

## 2015-11-05 DIAGNOSIS — R911 Solitary pulmonary nodule: Secondary | ICD-10-CM | POA: Diagnosis not present

## 2015-11-05 LAB — CBC WITH DIFFERENTIAL/PLATELET
Basophils Absolute: 0.1 10*3/uL (ref 0–0.1)
Basophils Relative: 1 %
EOS ABS: 0.4 10*3/uL (ref 0–0.7)
EOS PCT: 7 %
HCT: 42.1 % (ref 40.0–52.0)
HEMOGLOBIN: 14.2 g/dL (ref 13.0–18.0)
LYMPHS ABS: 0.5 10*3/uL — AB (ref 1.0–3.6)
LYMPHS PCT: 9 %
MCH: 28 pg (ref 26.0–34.0)
MCHC: 33.7 g/dL (ref 32.0–36.0)
MCV: 83 fL (ref 80.0–100.0)
MONO ABS: 0.5 10*3/uL (ref 0.2–1.0)
MONOS PCT: 9 %
Neutro Abs: 4.5 10*3/uL (ref 1.4–6.5)
Neutrophils Relative %: 74 %
Platelets: 314 10*3/uL (ref 150–440)
RBC: 5.08 MIL/uL (ref 4.40–5.90)
RDW: 13.7 % (ref 11.5–14.5)
WBC: 6 10*3/uL (ref 3.8–10.6)

## 2015-11-05 LAB — GLUCOSE, CAPILLARY: Glucose-Capillary: 82 mg/dL (ref 65–99)

## 2015-11-05 MED ORDER — HEPARIN SOD (PORK) LOCK FLUSH 100 UNIT/ML IV SOLN
500.0000 [IU] | Freq: Once | INTRAVENOUS | Status: AC
  Administered 2015-11-05: 500 [IU] via INTRAVENOUS
  Filled 2015-11-05: qty 5

## 2015-11-05 MED ORDER — SODIUM CHLORIDE 0.9 % IJ SOLN
10.0000 mL | Freq: Once | INTRAMUSCULAR | Status: AC
  Administered 2015-11-05: 10 mL via INTRAVENOUS
  Filled 2015-11-05: qty 10

## 2015-11-05 MED ORDER — FLUDEOXYGLUCOSE F - 18 (FDG) INJECTION
12.5500 | Freq: Once | INTRAVENOUS | Status: AC | PRN
  Administered 2015-11-05: 12.55 via INTRAVENOUS

## 2015-11-07 ENCOUNTER — Inpatient Hospital Stay (HOSPITAL_BASED_OUTPATIENT_CLINIC_OR_DEPARTMENT_OTHER): Payer: Medicare Other | Admitting: Oncology

## 2015-11-07 ENCOUNTER — Encounter: Payer: Self-pay | Admitting: Oncology

## 2015-11-07 VITALS — BP 124/85 | HR 78 | Temp 96.8°F | Wt 188.5 lb

## 2015-11-07 DIAGNOSIS — Z79899 Other long term (current) drug therapy: Secondary | ICD-10-CM | POA: Diagnosis not present

## 2015-11-07 DIAGNOSIS — Z923 Personal history of irradiation: Secondary | ICD-10-CM

## 2015-11-07 DIAGNOSIS — C3411 Malignant neoplasm of upper lobe, right bronchus or lung: Secondary | ICD-10-CM | POA: Diagnosis not present

## 2015-11-07 DIAGNOSIS — Z87891 Personal history of nicotine dependence: Secondary | ICD-10-CM | POA: Diagnosis not present

## 2015-11-07 DIAGNOSIS — M1990 Unspecified osteoarthritis, unspecified site: Secondary | ICD-10-CM | POA: Diagnosis not present

## 2015-11-07 DIAGNOSIS — R590 Localized enlarged lymph nodes: Secondary | ICD-10-CM

## 2015-11-07 DIAGNOSIS — K219 Gastro-esophageal reflux disease without esophagitis: Secondary | ICD-10-CM

## 2015-11-07 DIAGNOSIS — T50995S Adverse effect of other drugs, medicaments and biological substances, sequela: Secondary | ICD-10-CM

## 2015-11-07 DIAGNOSIS — L270 Generalized skin eruption due to drugs and medicaments taken internally: Secondary | ICD-10-CM

## 2015-11-07 DIAGNOSIS — T451X5S Adverse effect of antineoplastic and immunosuppressive drugs, sequela: Secondary | ICD-10-CM | POA: Diagnosis not present

## 2015-11-07 DIAGNOSIS — Z9221 Personal history of antineoplastic chemotherapy: Secondary | ICD-10-CM | POA: Diagnosis not present

## 2015-11-07 DIAGNOSIS — C3491 Malignant neoplasm of unspecified part of right bronchus or lung: Secondary | ICD-10-CM

## 2015-11-07 DIAGNOSIS — C3492 Malignant neoplasm of unspecified part of left bronchus or lung: Principal | ICD-10-CM

## 2015-11-07 DIAGNOSIS — Z859 Personal history of malignant neoplasm, unspecified: Secondary | ICD-10-CM | POA: Insufficient documentation

## 2015-11-09 ENCOUNTER — Telehealth: Payer: Self-pay | Admitting: *Deleted

## 2015-11-09 NOTE — Telephone Encounter (Signed)
With new chemo starting, patient would like to know if it is okay for him to continue to take all the supplements that he has been taking. His supplements include a multivitamin, vitamin C, vitamin E, other antioxidents.

## 2015-11-09 NOTE — Telephone Encounter (Signed)
Yes, that is fine. 

## 2015-11-13 ENCOUNTER — Other Ambulatory Visit: Payer: Self-pay | Admitting: Oncology

## 2015-11-13 DIAGNOSIS — C3411 Malignant neoplasm of upper lobe, right bronchus or lung: Secondary | ICD-10-CM

## 2015-11-13 MED ORDER — LIDOCAINE-PRILOCAINE 2.5-2.5 % EX CREA: TOPICAL_CREAM | CUTANEOUS | Status: DC

## 2015-11-13 MED ORDER — ONDANSETRON HCL 8 MG PO TABS: 8.0000 mg | ORAL_TABLET | Freq: Two times a day (BID) | ORAL | Status: DC | PRN

## 2015-11-13 MED ORDER — PROCHLORPERAZINE MALEATE 10 MG PO TABS: 10.0000 mg | ORAL_TABLET | Freq: Four times a day (QID) | ORAL | Status: DC | PRN

## 2015-11-13 MED ORDER — FOLIC ACID 1 MG PO TABS: 1.0000 mg | ORAL_TABLET | Freq: Every day | ORAL | Status: DC

## 2015-11-14 ENCOUNTER — Ambulatory Visit: Payer: Self-pay

## 2015-11-14 ENCOUNTER — Inpatient Hospital Stay: Payer: Medicare Other

## 2015-11-14 ENCOUNTER — Inpatient Hospital Stay: Payer: Medicare Other | Attending: Oncology | Admitting: Oncology

## 2015-11-14 VITALS — BP 105/71 | HR 60 | Temp 97.1°F | Resp 18 | Wt 189.4 lb

## 2015-11-14 DIAGNOSIS — Z79899 Other long term (current) drug therapy: Secondary | ICD-10-CM | POA: Insufficient documentation

## 2015-11-14 DIAGNOSIS — Z79891 Long term (current) use of opiate analgesic: Secondary | ICD-10-CM | POA: Diagnosis not present

## 2015-11-14 DIAGNOSIS — Z5111 Encounter for antineoplastic chemotherapy: Secondary | ICD-10-CM | POA: Diagnosis not present

## 2015-11-14 DIAGNOSIS — R5381 Other malaise: Secondary | ICD-10-CM | POA: Diagnosis not present

## 2015-11-14 DIAGNOSIS — R05 Cough: Secondary | ICD-10-CM | POA: Diagnosis not present

## 2015-11-14 DIAGNOSIS — Z923 Personal history of irradiation: Secondary | ICD-10-CM | POA: Diagnosis not present

## 2015-11-14 DIAGNOSIS — L270 Generalized skin eruption due to drugs and medicaments taken internally: Secondary | ICD-10-CM

## 2015-11-14 DIAGNOSIS — C3411 Malignant neoplasm of upper lobe, right bronchus or lung: Secondary | ICD-10-CM | POA: Diagnosis not present

## 2015-11-14 DIAGNOSIS — R630 Anorexia: Secondary | ICD-10-CM | POA: Insufficient documentation

## 2015-11-14 DIAGNOSIS — Z9221 Personal history of antineoplastic chemotherapy: Secondary | ICD-10-CM

## 2015-11-14 DIAGNOSIS — R5383 Other fatigue: Secondary | ICD-10-CM | POA: Insufficient documentation

## 2015-11-14 DIAGNOSIS — R110 Nausea: Secondary | ICD-10-CM | POA: Diagnosis not present

## 2015-11-14 DIAGNOSIS — T451X5S Adverse effect of antineoplastic and immunosuppressive drugs, sequela: Secondary | ICD-10-CM | POA: Insufficient documentation

## 2015-11-14 DIAGNOSIS — R531 Weakness: Secondary | ICD-10-CM | POA: Insufficient documentation

## 2015-11-14 DIAGNOSIS — K219 Gastro-esophageal reflux disease without esophagitis: Secondary | ICD-10-CM

## 2015-11-14 DIAGNOSIS — M1990 Unspecified osteoarthritis, unspecified site: Secondary | ICD-10-CM | POA: Diagnosis not present

## 2015-11-14 DIAGNOSIS — Z87891 Personal history of nicotine dependence: Secondary | ICD-10-CM | POA: Diagnosis not present

## 2015-11-14 LAB — CBC WITH DIFFERENTIAL/PLATELET
BASOS ABS: 0 10*3/uL (ref 0–0.1)
BASOS PCT: 1 %
EOS ABS: 0.3 10*3/uL (ref 0–0.7)
Eosinophils Relative: 5 %
HCT: 39.8 % — ABNORMAL LOW (ref 40.0–52.0)
Hemoglobin: 13.5 g/dL (ref 13.0–18.0)
Lymphocytes Relative: 8 %
Lymphs Abs: 0.4 10*3/uL — ABNORMAL LOW (ref 1.0–3.6)
MCH: 28.3 pg (ref 26.0–34.0)
MCHC: 34 g/dL (ref 32.0–36.0)
MCV: 83.1 fL (ref 80.0–100.0)
MONO ABS: 0.6 10*3/uL (ref 0.2–1.0)
MONOS PCT: 10 %
Neutro Abs: 4.2 10*3/uL (ref 1.4–6.5)
Neutrophils Relative %: 76 %
PLATELETS: 294 10*3/uL (ref 150–440)
RBC: 4.79 MIL/uL (ref 4.40–5.90)
RDW: 13.7 % (ref 11.5–14.5)
WBC: 5.5 10*3/uL (ref 3.8–10.6)

## 2015-11-14 LAB — COMPREHENSIVE METABOLIC PANEL
ALT: 16 U/L — AB (ref 17–63)
AST: 19 U/L (ref 15–41)
Albumin: 3.9 g/dL (ref 3.5–5.0)
Alkaline Phosphatase: 74 U/L (ref 38–126)
Anion gap: 5 (ref 5–15)
BILIRUBIN TOTAL: 0.9 mg/dL (ref 0.3–1.2)
BUN: 8 mg/dL (ref 6–20)
CHLORIDE: 102 mmol/L (ref 101–111)
CO2: 27 mmol/L (ref 22–32)
CREATININE: 0.81 mg/dL (ref 0.61–1.24)
Calcium: 8.9 mg/dL (ref 8.9–10.3)
GFR calc Af Amer: >60 mL/min (ref >60)
Glucose, Bld: 109 mg/dL — ABNORMAL HIGH (ref 65–99)
Potassium: 3.7 mmol/L (ref 3.5–5.1)
Sodium: 134 mmol/L — ABNORMAL LOW (ref 135–145)
Total Protein: 6.8 g/dL (ref 6.5–8.1)

## 2015-11-14 MED ORDER — CYANOCOBALAMIN 1000 MCG/ML IJ SOLN: 1000.0000 ug | Freq: Once | INTRAMUSCULAR | Status: DC

## 2015-11-14 MED ORDER — PALONOSETRON HCL INJECTION 0.25 MG/5ML
0.2500 mg | Freq: Once | INTRAVENOUS | Status: AC
  Administered 2015-11-14: 0.25 mg via INTRAVENOUS
  Filled 2015-11-14: qty 5

## 2015-11-14 MED ORDER — SODIUM CHLORIDE 0.9 % IV SOLN
75.0000 mg/m2 | Freq: Once | INTRAVENOUS | Status: AC
  Administered 2015-11-14: 153 mg via INTRAVENOUS
  Filled 2015-11-14: qty 153

## 2015-11-14 MED ORDER — SODIUM CHLORIDE 0.9 % IJ SOLN
10.0000 mL | Freq: Once | INTRAMUSCULAR | Status: AC
  Administered 2015-11-14: 10 mL via INTRAVENOUS
  Filled 2015-11-14: qty 10

## 2015-11-14 MED ORDER — HEPARIN SOD (PORK) LOCK FLUSH 100 UNIT/ML IV SOLN
500.0000 [IU] | Freq: Once | INTRAVENOUS | Status: AC
  Administered 2015-11-14: 500 [IU] via INTRAVENOUS
  Filled 2015-11-14: qty 5

## 2015-11-14 MED ORDER — CYANOCOBALAMIN 1000 MCG/ML IJ SOLN
1000.0000 ug | Freq: Once | INTRAMUSCULAR | Status: AC
  Administered 2015-11-14: 1000 ug via INTRAMUSCULAR
  Filled 2015-11-14: qty 1

## 2015-11-14 MED ORDER — HEPARIN SOD (PORK) LOCK FLUSH 100 UNIT/ML IV SOLN: 500.0000 [IU] | Freq: Once | INTRAVENOUS | Status: DC | PRN

## 2015-11-14 MED ORDER — SODIUM CHLORIDE 0.9 % IV SOLN
Freq: Once | INTRAVENOUS | Status: AC
Start: 2015-11-14 — End: 2015-11-14
  Administered 2015-11-14: 10:00:00 via INTRAVENOUS
  Filled 2015-11-14: qty 1000

## 2015-11-14 MED ORDER — SODIUM CHLORIDE 0.9 % IV SOLN
Freq: Once | INTRAVENOUS | Status: AC
  Administered 2015-11-14: 12:00:00 via INTRAVENOUS
  Filled 2015-11-14: qty 5

## 2015-11-14 MED ORDER — POTASSIUM CHLORIDE 2 MEQ/ML IV SOLN
Freq: Once | INTRAVENOUS | Status: AC
  Administered 2015-11-14: 10:00:00 via INTRAVENOUS
  Filled 2015-11-14: qty 1000

## 2015-11-14 MED ORDER — SODIUM CHLORIDE 0.9 % IV SOLN
1000.0000 mg | Freq: Once | INTRAVENOUS | Status: AC
  Administered 2015-11-14: 1000 mg via INTRAVENOUS
  Filled 2015-11-14: qty 40

## 2015-11-14 NOTE — Progress Notes (Signed)
Patient has a cough that he can't get to improve with any OTC meds or the Benzonatate.

## 2015-11-16 NOTE — Progress Notes (Signed)
[place]  [phone] [phone]) [phone]  ID: Christopher Huber OB: 1946-11-18  MR#: [id number]  BOF#:[id number]  Patient Care Team: Venia Carbon, MD as PCP - General (Internal Medicine)  CHIEF COMPLAINT:  Chief Complaint  Patient presents with  . Lung Cancer    INTERVAL HISTORY: Patient returns to clinic today for further evaluation, discussion of his imaging results, and treatment planning. He currently feels well and is asymptomatic. He has no neurologic complaints. He has a good appetite and denies weight loss. He denies any fevers. He has no further chest pain, shortness of breath, cough, or hemoptysis.  He has no nausea, vomiting, constipation, or diarrhea.  Patient offers no specific complaints today.   REVIEW OF SYSTEMS:   Review of Systems  Constitutional: Negative.   Respiratory: Negative.   Cardiovascular: Negative.  Negative for chest pain.  Gastrointestinal: Negative.   Musculoskeletal: Negative.   Skin: Negative for rash.  Neurological: Negative.     As per HPI. Otherwise, a complete review of systems is negatve.  PAST MEDICAL HISTORY: Past Medical History  Diagnosis Date  . Lung cancer ([place])   . Arthritis     right arm  . GERD (gastroesophageal reflux disease)     PAST SURGICAL HISTORY: Past Surgical History  Procedure Laterality Date  . Colonoscopy    . Esophagogastroduodenoscopy    . Left shoulder reconstruction  ~1965    after hunting accident    FAMILY HISTORY Family History  Problem Relation Age of Onset  . Heart disease Father   . Diabetes Neg Hx   . Cancer Neg Hx        ADVANCED DIRECTIVES:    HEALTH MAINTENANCE: Social History  Substance Use Topics  . Smoking status: Former Smoker    Quit date: 06/27/1997  . Smokeless tobacco: Never Used  . Alcohol Use: No     Colonoscopy:  PAP:  Bone density:  Lipid panel:  Allergies  Allergen Reactions  . Codeine Nausea And Vomiting    Dizzy      Current Outpatient Prescriptions  Medication Sig Dispense Refill  . Ascorbic Acid (VITAMIN C) 1000 MG tablet Take 1,000 mg by mouth daily.    . benzonatate (TESSALON) 100 MG capsule Take 1 capsule (100 mg total) by mouth 3 (three) times daily as needed for cough. 30 capsule 0  . Cholecalciferol (VITAMIN D) 2000 UNITS CAPS Take 1 capsule by mouth daily.    Marland Kitchen erlotinib (TARCEVA) 25 MG tablet Take 2 tablets (50 mg total) by mouth daily. Take on an empty stomach 1 hour before meals or 2 hours after. (Patient not taking: Reported on 11/14/2015) 60 tablet 0  . folic acid (FOLVITE) 1 MG tablet Take 1 tablet (1 mg total) by mouth daily. Start 5-7 days before Alimta chemotherapy. Continue until 21 days after Alimta completed. 100 tablet 3  . HYDROcodone-acetaminophen (NORCO/VICODIN) 5-325 MG tablet TK 1 TO 2 TS PO Q 4 H PRF MODERATE PAIN  0  . lidocaine-prilocaine (EMLA) cream Apply to affected area once 30 g 3  . magnesium oxide (MAG-OX) 400 MG tablet Take 400 mg by mouth daily.    . Multiple Vitamin (MULTIVITAMIN WITH MINERALS) TABS Take 1 tablet by mouth daily.    . ondansetron (ZOFRAN) 8 MG tablet Take 1 tablet (8 mg total) by mouth 2 (two) times daily as needed (Nausea or vomiting). Start if needed on the third day after chemotherapy. 30 tablet 1  . prochlorperazine (COMPAZINE) 10 MG  tablet Take 1 tablet (10 mg total) by mouth every 6 (six) hours as needed (Nausea or vomiting). 30 tablet 1  . ranitidine (ZANTAC) 150 MG tablet TAKE 1 TABLET BY MOUTH TWICE A DAY 60 tablet 11  . vitamin E 400 UNIT capsule Take 400 Units by mouth daily.     No current facility-administered medications for this visit.    OBJECTIVE: Filed Vitals:   11/07/15 1036  BP: 124/85  Pulse: 78  Temp: 96.8 F (36 C)     Body mass index is 27.82 kg/(m^2).    ECOG FS:0 - Asymptomatic  General: Well-developed, well-nourished, no acute distress. Eyes: anicteric sclera. Lungs: Clear to auscultation bilaterally. Heart:  Regular rate and rhythm. No rubs, murmurs, or gallops. Abdomen: Soft, nontender, nondistended. No organomegaly noted, normoactive bowel sounds. Musculoskeletal: No edema, cyanosis, or clubbing. Neuro: Alert, answering all questions appropriately. Cranial nerves grossly intact. Skin: No rashes or petechiae noted. Psych: Normal affect.  LAB RESULTS:  Lab Results  Component Value Date   NA 134* 11/14/2015   K 3.7 11/14/2015   CL 102 11/14/2015   CO2 27 11/14/2015   GLUCOSE 109* 11/14/2015   BUN 8 11/14/2015   CREATININE 0.81 11/14/2015   CALCIUM 8.9 11/14/2015   PROT 6.8 11/14/2015   ALBUMIN 3.9 11/14/2015   AST 19 11/14/2015   ALT 16* 11/14/2015   ALKPHOS 74 11/14/2015   BILITOT 0.9 11/14/2015   GFRNONAA >60 11/14/2015   GFRAA >60 11/14/2015    Lab Results  Component Value Date   WBC 5.5 11/14/2015   NEUTROABS 4.2 11/14/2015   HGB 13.5 11/14/2015   HCT 39.8* 11/14/2015   MCV 83.1 11/14/2015   PLT 294 11/14/2015     STUDIES: Nm Pet Image Restag (ps) Skull Base To Thigh  11/05/2015  CLINICAL DATA:  Subsequent treatment strategy for right lung carcinoma. Recent radiation therapy and previous chemotherapy. Previous left lung carcinoma. EXAM: NUCLEAR MEDICINE PET SKULL BASE TO THIGH TECHNIQUE: 12.6 mCi F-18 FDG was injected intravenously. Full-ring PET imaging was performed from the skull base to thigh after the radiotracer. CT data was obtained and used for attenuation correction and anatomic localization. FASTING BLOOD GLUCOSE:  Value: 82 mg/dl COMPARISON:  CT on 09/23/2015 and 06/19/2015 FINDINGS: NECK No hypermetabolic lymph nodes in the neck. CHEST Opacity and volume loss in the posterior left upper lung remains stable and shows low-grade hypermetabolic activity, consistent with radiation changes. New hypermetabolic right hilar lymphadenopathy is seen measuring 2.0 cm in short axis on image 104/series 3. This has SUV max of 9.8. A 2.1 cm spiculated pulmonary nodule seen in  the central right upper lobe has increased in size from 9 mm 06/19/2015 exam, and now shows a central focus of cavitation. This has an SUV max of 12.8, suspicious for bronchogenic carcinoma. Continued decrease in multiple ill-defined nodules and masses in the peripheral right upper lobe and right lung apex compared to previous studies. Largest residual masslike opacity in the posterior right lung apex measures 2.0 cm on image study 9/series 3 compared to 2.5 cm previously. This shows low-grade hypermetabolic activity with SUV max of 3.2. ABDOMEN/PELVIS No abnormal hypermetabolic activity within the liver, pancreas, adrenal glands, or spleen. No hypermetabolic lymph nodes in the abdomen or pelvis. SKELETON No focal hypermetabolic activity to suggest skeletal metastasis. IMPRESSION: Increased size of 2.1 cm hypermetabolic central right upper lobe nodule with central cavitation, suspicious for bronchogenic carcinoma. New hypermetabolic right hilar lymphadenopathy, consistent with metastatic disease. No hypermetabolic mediastinal lymph nodes  or distant metastatic disease. Decreased size of multiple ill-defined nodules and masses in right upper lobe and right lung apex, consistent with decreased carcinoma. Stable left lung post radiation changes. Electronically Signed   By: Earle Gell M.D.   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  PLAN:    1.  Lung cancer:  PET scan results reviewed independently and reported as above with likely progression of disease. Patient has been instructed to discontinue Tarceva. He will return to clinic in approximately one week to initiate salvage chemotherapy with cisplatin and pemetrexed. Patient will receive this regimen every 3 weeks and then reimage after approximately 4 cycles.   2.  Rash:  Secondary to Tarceva.  Continue hydrocortisone cream as needed. 3.  Hypomagnesemia: Resolved. 4.   Pneumonia: Resolved.  Approximately 30 minutes was spent in discussion of which greater than 50% was consultation.  Patient expressed understanding and was in agreement with this plan. He also understands that He can call clinic at any time with any questions, concerns, or complaints.    Lloyd Huger, MD   11/16/2015 1:03 PM

## 2015-11-16 NOTE — Progress Notes (Signed)
[place]  [phone]) [phone] Fax:(336) [phone]  ID: Christopher Huber OB: [birth year]  MR#: [id number]  GPQ#:[id number]  Patient Care Team: Venia Carbon, MD as PCP - General (Internal Medicine)  CHIEF COMPLAINT:  Chief Complaint  Patient presents with  . Lung Cancer  . Chemotherapy    new treatment    INTERVAL HISTORY: Patient returns to clinic today to initiate cycle 1 of cisplatin and pemetrexed. He is complaining of a chronic cough today, but otherwise feels well. He has no neurologic complaints. He has a good appetite and denies weight loss. He denies any fevers. He has no further chest pain, shortness of breath, or hemoptysis.  He has no nausea, vomiting, constipation, or diarrhea.  Patient offers no specific complaints today.   REVIEW OF SYSTEMS:   Review of Systems  Constitutional: Negative.   Respiratory: Positive for cough. Negative for hemoptysis, sputum production and shortness of breath.   Cardiovascular: Negative.  Negative for chest pain.  Gastrointestinal: Negative.   Musculoskeletal: Negative.   Skin: Negative for rash.  Neurological: Negative.     As per HPI. Otherwise, a complete review of systems is negatve.  PAST MEDICAL HISTORY: Past Medical History  Diagnosis Date  . Lung cancer ([place])   . Arthritis     right arm  . GERD (gastroesophageal reflux disease)     PAST SURGICAL HISTORY: Past Surgical History  Procedure Laterality Date  . Colonoscopy    . Esophagogastroduodenoscopy    . Left shoulder reconstruction  ~1965    after hunting accident    FAMILY HISTORY Family History  Problem Relation Age of Onset  . Heart disease Father   . Diabetes Neg Hx   . Cancer Neg Hx        ADVANCED DIRECTIVES:    HEALTH MAINTENANCE: Social History  Substance Use Topics  . Smoking status: Former Smoker    Quit date: 06/27/1997  . Smokeless tobacco: Never Used  . Alcohol Use: No     Colonoscopy:  PAP:  Bone  density:  Lipid panel:  Allergies  Allergen Reactions  . Codeine Nausea And Vomiting    Dizzy     Current Outpatient Prescriptions  Medication Sig Dispense Refill  . Ascorbic Acid (VITAMIN C) 1000 MG tablet Take 1,000 mg by mouth daily.    . benzonatate (TESSALON) 100 MG capsule Take 1 capsule (100 mg total) by mouth 3 (three) times daily as needed for cough. 30 capsule 0  . Cholecalciferol (VITAMIN D) 2000 UNITS CAPS Take 1 capsule by mouth daily.    . folic acid (FOLVITE) 1 MG tablet Take 1 tablet (1 mg total) by mouth daily. Start 5-7 days before Alimta chemotherapy. Continue until 21 days after Alimta completed. 100 tablet 3  . HYDROcodone-acetaminophen (NORCO/VICODIN) 5-325 MG tablet TK 1 TO 2 TS PO Q 4 H PRF MODERATE PAIN  0  . lidocaine-prilocaine (EMLA) cream Apply to affected area once 30 g 3  . magnesium oxide (MAG-OX) 400 MG tablet Take 400 mg by mouth daily.    . Multiple Vitamin (MULTIVITAMIN WITH MINERALS) TABS Take 1 tablet by mouth daily.    . ondansetron (ZOFRAN) 8 MG tablet Take 1 tablet (8 mg total) by mouth 2 (two) times daily as needed (Nausea or vomiting). Start if needed on the third day after chemotherapy. 30 tablet 1  . prochlorperazine (COMPAZINE) 10 MG tablet Take 1 tablet (10 mg total) by mouth every 6 (six) hours as needed (Nausea or vomiting).  30 tablet 1  . ranitidine (ZANTAC) 150 MG tablet TAKE 1 TABLET BY MOUTH TWICE A DAY 60 tablet 11  . vitamin E 400 UNIT capsule Take 400 Units by mouth daily.    Marland Kitchen erlotinib (TARCEVA) 25 MG tablet Take 2 tablets (50 mg total) by mouth daily. Take on an empty stomach 1 hour before meals or 2 hours after. (Patient not taking: Reported on 11/14/2015) 60 tablet 0   No current facility-administered medications for this visit.    OBJECTIVE: Filed Vitals:   11/14/15 0904  BP: 105/71  Pulse: 60  Temp: 97.1 F (36.2 C)  Resp: 18     Body mass index is 27.95 kg/(m^2).    ECOG FS:0 - Asymptomatic  General: Well-developed,  well-nourished, no acute distress. Eyes: anicteric sclera. Lungs: Clear to auscultation bilaterally. Heart: Regular rate and rhythm. No rubs, murmurs, or gallops. Abdomen: Soft, nontender, nondistended. No organomegaly noted, normoactive bowel sounds. Musculoskeletal: No edema, cyanosis, or clubbing. Neuro: Alert, answering all questions appropriately. Cranial nerves grossly intact. Skin: No rashes or petechiae noted. Psych: Normal affect.  LAB RESULTS:  Lab Results  Component Value Date   NA 134* 11/14/2015   K 3.7 11/14/2015   CL 102 11/14/2015   CO2 27 11/14/2015   GLUCOSE 109* 11/14/2015   BUN 8 11/14/2015   CREATININE 0.81 11/14/2015   CALCIUM 8.9 11/14/2015   PROT 6.8 11/14/2015   ALBUMIN 3.9 11/14/2015   AST 19 11/14/2015   ALT 16* 11/14/2015   ALKPHOS 74 11/14/2015   BILITOT 0.9 11/14/2015   GFRNONAA >60 11/14/2015   GFRAA >60 11/14/2015    Lab Results  Component Value Date   WBC 5.5 11/14/2015   NEUTROABS 4.2 11/14/2015   HGB 13.5 11/14/2015   HCT 39.8* 11/14/2015   MCV 83.1 11/14/2015   PLT 294 11/14/2015     STUDIES: Nm Pet Image Restag (ps) Skull Base To Thigh  11/05/2015  CLINICAL DATA:  Subsequent treatment strategy for right lung carcinoma. Recent radiation therapy and previous chemotherapy. Previous left lung carcinoma. EXAM: NUCLEAR MEDICINE PET SKULL BASE TO THIGH TECHNIQUE: 12.6 mCi F-18 FDG was injected intravenously. Full-ring PET imaging was performed from the skull base to thigh after the radiotracer. CT data was obtained and used for attenuation correction and anatomic localization. FASTING BLOOD GLUCOSE:  Value: 82 mg/dl COMPARISON:  CT on 09/23/2015 and 06/19/2015 FINDINGS: NECK No hypermetabolic lymph nodes in the neck. CHEST Opacity and volume loss in the posterior left upper lung remains stable and shows low-grade hypermetabolic activity, consistent with radiation changes. New hypermetabolic right hilar lymphadenopathy is seen measuring 2.0  cm in short axis on image 104/series 3. This has SUV max of 9.8. A 2.1 cm spiculated pulmonary nodule seen in the central right upper lobe has increased in size from 9 mm 06/19/2015 exam, and now shows a central focus of cavitation. This has an SUV max of 12.8, suspicious for bronchogenic carcinoma. Continued decrease in multiple ill-defined nodules and masses in the peripheral right upper lobe and right lung apex compared to previous studies. Largest residual masslike opacity in the posterior right lung apex measures 2.0 cm on image study 9/series 3 compared to 2.5 cm previously. This shows low-grade hypermetabolic activity with SUV max of 3.2. ABDOMEN/PELVIS No abnormal hypermetabolic activity within the liver, pancreas, adrenal glands, or spleen. No hypermetabolic lymph nodes in the abdomen or pelvis. SKELETON No focal hypermetabolic activity to suggest skeletal metastasis. IMPRESSION: Increased size of 2.1 cm hypermetabolic central right upper  lobe nodule with central cavitation, suspicious for bronchogenic carcinoma. New hypermetabolic right hilar lymphadenopathy, consistent with metastatic disease. No hypermetabolic mediastinal lymph nodes or distant metastatic disease. Decreased size of multiple ill-defined nodules and masses in right upper lobe and right lung apex, consistent with decreased carcinoma. Stable left lung post radiation changes. Electronically Signed   By: Earle Gell M.D.   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  PLAN:    1.  Lung cancer:  PET scan results reviewed independently and reported as above with likely progression of disease. Patient has been instructed to discontinue Tarceva. Proceed with cycle 1 of cisplatin and pemetrexed. Return to clinic in 1 week for laboratory work and further evaluation and then in 3 weeks for consideration of cycle 2. Patient will receive this regimen every 3  weeks and then reimage after approximately 4 cycles.   2.  Rash:  Secondary to Tarceva which has been discontinued. Resolved. 3.  Hypomagnesemia: Resolved. 4.  Pneumonia: Resolved.  5. Cough: Patient states that OTC medications and Tessalon Perles do not work. He has an allergy to codeine. Treatment as above. 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 Consider prednisone in the future.   Patient expressed understanding and was in agreement with this plan. He also understands that He can call clinic at any time with any questions, concerns, or complaints.    Lloyd Huger, MD   11/16/2015 1:08 PM

## 2015-11-21 ENCOUNTER — Inpatient Hospital Stay: Payer: Medicare Other

## 2015-11-21 ENCOUNTER — Inpatient Hospital Stay (HOSPITAL_BASED_OUTPATIENT_CLINIC_OR_DEPARTMENT_OTHER): Payer: Medicare Other | Admitting: Oncology

## 2015-11-21 VITALS — BP 125/80 | HR 64 | Temp 97.2°F | Resp 18 | Wt 188.7 lb

## 2015-11-21 DIAGNOSIS — C3411 Malignant neoplasm of upper lobe, right bronchus or lung: Secondary | ICD-10-CM

## 2015-11-21 DIAGNOSIS — R110 Nausea: Secondary | ICD-10-CM | POA: Diagnosis not present

## 2015-11-21 DIAGNOSIS — Z923 Personal history of irradiation: Secondary | ICD-10-CM

## 2015-11-21 DIAGNOSIS — R630 Anorexia: Secondary | ICD-10-CM

## 2015-11-21 DIAGNOSIS — R531 Weakness: Secondary | ICD-10-CM | POA: Diagnosis not present

## 2015-11-21 DIAGNOSIS — L270 Generalized skin eruption due to drugs and medicaments taken internally: Secondary | ICD-10-CM

## 2015-11-21 DIAGNOSIS — R05 Cough: Secondary | ICD-10-CM | POA: Diagnosis not present

## 2015-11-21 DIAGNOSIS — Z79899 Other long term (current) drug therapy: Secondary | ICD-10-CM | POA: Diagnosis not present

## 2015-11-21 DIAGNOSIS — Z9221 Personal history of antineoplastic chemotherapy: Secondary | ICD-10-CM | POA: Diagnosis not present

## 2015-11-21 DIAGNOSIS — R5383 Other fatigue: Secondary | ICD-10-CM | POA: Diagnosis not present

## 2015-11-21 DIAGNOSIS — Z87891 Personal history of nicotine dependence: Secondary | ICD-10-CM | POA: Diagnosis not present

## 2015-11-21 DIAGNOSIS — R5381 Other malaise: Secondary | ICD-10-CM

## 2015-11-21 DIAGNOSIS — M1990 Unspecified osteoarthritis, unspecified site: Secondary | ICD-10-CM | POA: Diagnosis not present

## 2015-11-21 DIAGNOSIS — K219 Gastro-esophageal reflux disease without esophagitis: Secondary | ICD-10-CM | POA: Diagnosis not present

## 2015-11-21 DIAGNOSIS — T451X5S Adverse effect of antineoplastic and immunosuppressive drugs, sequela: Secondary | ICD-10-CM

## 2015-11-21 DIAGNOSIS — Z5111 Encounter for antineoplastic chemotherapy: Secondary | ICD-10-CM | POA: Diagnosis not present

## 2015-11-21 LAB — COMPREHENSIVE METABOLIC PANEL
ALK PHOS: 83 U/L (ref 38–126)
ALT: 35 U/L (ref 17–63)
AST: 24 U/L (ref 15–41)
Albumin: 4.2 g/dL (ref 3.5–5.0)
Anion gap: 7 (ref 5–15)
BILIRUBIN TOTAL: 0.6 mg/dL (ref 0.3–1.2)
BUN: 12 mg/dL (ref 6–20)
CALCIUM: 9 mg/dL (ref 8.9–10.3)
CHLORIDE: 97 mmol/L — AB (ref 101–111)
CO2: 27 mmol/L (ref 22–32)
CREATININE: 0.97 mg/dL (ref 0.61–1.24)
Glucose, Bld: 118 mg/dL — ABNORMAL HIGH (ref 65–99)
Potassium: 4.2 mmol/L (ref 3.5–5.1)
Sodium: 131 mmol/L — ABNORMAL LOW (ref 135–145)
TOTAL PROTEIN: 7 g/dL (ref 6.5–8.1)

## 2015-11-21 LAB — CBC WITH DIFFERENTIAL/PLATELET
Basophils Absolute: 0 10*3/uL (ref 0–0.1)
Basophils Relative: 1 %
EOS PCT: 8 %
Eosinophils Absolute: 0.3 10*3/uL (ref 0–0.7)
HEMATOCRIT: 38.4 % — AB (ref 40.0–52.0)
Hemoglobin: 13.2 g/dL (ref 13.0–18.0)
LYMPHS ABS: 0.3 10*3/uL — AB (ref 1.0–3.6)
LYMPHS PCT: 7 %
MCH: 28.2 pg (ref 26.0–34.0)
MCHC: 34.3 g/dL (ref 32.0–36.0)
MCV: 82.3 fL (ref 80.0–100.0)
Monocytes Absolute: 0.3 10*3/uL (ref 0.2–1.0)
Monocytes Relative: 8 %
NEUTROS ABS: 3.2 10*3/uL (ref 1.4–6.5)
Neutrophils Relative %: 76 %
PLATELETS: 232 10*3/uL (ref 150–440)
RBC: 4.67 MIL/uL (ref 4.40–5.90)
RDW: 13.2 % (ref 11.5–14.5)
WBC: 4.1 10*3/uL (ref 3.8–10.6)

## 2015-11-21 MED ORDER — LACTULOSE 10 GM/15ML PO SOLN: 30.0000 g | Freq: Four times a day (QID) | ORAL | Status: DC

## 2015-11-21 NOTE — Progress Notes (Signed)
Patient has not feeling well since his treatment.  He is constipated with last bowel movement 5 days ago and has only tried still softener with no relief.  Also having redness on his arms that does occasionally appear on is body as well.  Cough seems to have improved.

## 2015-12-05 ENCOUNTER — Inpatient Hospital Stay: Payer: Medicare Other

## 2015-12-05 ENCOUNTER — Inpatient Hospital Stay (HOSPITAL_BASED_OUTPATIENT_CLINIC_OR_DEPARTMENT_OTHER): Payer: Medicare Other | Admitting: Oncology

## 2015-12-05 VITALS — BP 129/86 | HR 68 | Temp 96.8°F | Resp 18 | Wt 191.4 lb

## 2015-12-05 DIAGNOSIS — L270 Generalized skin eruption due to drugs and medicaments taken internally: Secondary | ICD-10-CM | POA: Diagnosis not present

## 2015-12-05 DIAGNOSIS — R5381 Other malaise: Secondary | ICD-10-CM

## 2015-12-05 DIAGNOSIS — K219 Gastro-esophageal reflux disease without esophagitis: Secondary | ICD-10-CM | POA: Diagnosis not present

## 2015-12-05 DIAGNOSIS — Z79899 Other long term (current) drug therapy: Secondary | ICD-10-CM | POA: Diagnosis not present

## 2015-12-05 DIAGNOSIS — Z5111 Encounter for antineoplastic chemotherapy: Secondary | ICD-10-CM | POA: Diagnosis not present

## 2015-12-05 DIAGNOSIS — C3411 Malignant neoplasm of upper lobe, right bronchus or lung: Secondary | ICD-10-CM

## 2015-12-05 DIAGNOSIS — Z87891 Personal history of nicotine dependence: Secondary | ICD-10-CM | POA: Diagnosis not present

## 2015-12-05 DIAGNOSIS — R110 Nausea: Secondary | ICD-10-CM | POA: Diagnosis not present

## 2015-12-05 DIAGNOSIS — R05 Cough: Secondary | ICD-10-CM

## 2015-12-05 DIAGNOSIS — M1990 Unspecified osteoarthritis, unspecified site: Secondary | ICD-10-CM

## 2015-12-05 DIAGNOSIS — Z923 Personal history of irradiation: Secondary | ICD-10-CM | POA: Diagnosis not present

## 2015-12-05 DIAGNOSIS — T451X5S Adverse effect of antineoplastic and immunosuppressive drugs, sequela: Secondary | ICD-10-CM | POA: Diagnosis not present

## 2015-12-05 DIAGNOSIS — Z9221 Personal history of antineoplastic chemotherapy: Secondary | ICD-10-CM | POA: Diagnosis not present

## 2015-12-05 DIAGNOSIS — R630 Anorexia: Secondary | ICD-10-CM | POA: Diagnosis not present

## 2015-12-05 DIAGNOSIS — R5383 Other fatigue: Secondary | ICD-10-CM | POA: Diagnosis not present

## 2015-12-05 DIAGNOSIS — R531 Weakness: Secondary | ICD-10-CM

## 2015-12-05 LAB — COMPREHENSIVE METABOLIC PANEL WITH GFR
ALT: 12 U/L — ABNORMAL LOW (ref 17–63)
AST: 16 U/L (ref 15–41)
Albumin: 3.9 g/dL (ref 3.5–5.0)
Alkaline Phosphatase: 79 U/L (ref 38–126)
Anion gap: 4 — ABNORMAL LOW (ref 5–15)
BUN: 11 mg/dL (ref 6–20)
CO2: 27 mmol/L (ref 22–32)
Calcium: 8.9 mg/dL (ref 8.9–10.3)
Chloride: 101 mmol/L (ref 101–111)
Creatinine, Ser: 0.82 mg/dL (ref 0.61–1.24)
GFR calc Af Amer: >60 mL/min
GFR calc non Af Amer: >60 mL/min
Glucose, Bld: 92 mg/dL (ref 65–99)
Potassium: 4.1 mmol/L (ref 3.5–5.1)
Sodium: 132 mmol/L — ABNORMAL LOW (ref 135–145)
Total Bilirubin: 0.5 mg/dL (ref 0.3–1.2)
Total Protein: 6.7 g/dL (ref 6.5–8.1)

## 2015-12-05 LAB — CBC WITH DIFFERENTIAL/PLATELET
BASOS PCT: 1 %
Basophils Absolute: 0 10*3/uL (ref 0–0.1)
EOS ABS: 0.1 10*3/uL (ref 0–0.7)
EOS PCT: 4 %
HCT: 37.8 % — ABNORMAL LOW (ref 40.0–52.0)
Hemoglobin: 12.8 g/dL — ABNORMAL LOW (ref 13.0–18.0)
LYMPHS ABS: 0.5 10*3/uL — AB (ref 1.0–3.6)
Lymphocytes Relative: 14 %
MCH: 28.4 pg (ref 26.0–34.0)
MCHC: 33.8 g/dL (ref 32.0–36.0)
MCV: 84 fL (ref 80.0–100.0)
MONO ABS: 0.6 10*3/uL (ref 0.2–1.0)
Monocytes Relative: 17 %
NEUTROS PCT: 64 %
Neutro Abs: 2.1 10*3/uL (ref 1.4–6.5)
PLATELETS: 319 10*3/uL (ref 150–440)
RBC: 4.5 MIL/uL (ref 4.40–5.90)
RDW: 13.7 % (ref 11.5–14.5)
WBC: 3.2 10*3/uL — ABNORMAL LOW (ref 3.8–10.6)

## 2015-12-05 MED ORDER — HEPARIN SOD (PORK) LOCK FLUSH 100 UNIT/ML IV SOLN
500.0000 [IU] | Freq: Once | INTRAVENOUS | Status: AC
  Administered 2015-12-05: 500 [IU] via INTRAVENOUS
  Filled 2015-12-05: qty 5

## 2015-12-05 MED ORDER — CYANOCOBALAMIN 1000 MCG/ML IJ SOLN
1000.0000 ug | Freq: Once | INTRAMUSCULAR | Status: AC
  Administered 2015-12-05: 1000 ug via INTRAMUSCULAR
  Filled 2015-12-05: qty 1

## 2015-12-05 MED ORDER — SODIUM CHLORIDE 0.9 % IV SOLN
Freq: Once | INTRAVENOUS | Status: AC
  Administered 2015-12-05: 10:00:00 via INTRAVENOUS
  Filled 2015-12-05: qty 1000

## 2015-12-05 MED ORDER — POTASSIUM CHLORIDE 2 MEQ/ML IV SOLN
Freq: Once | INTRAVENOUS | Status: AC
  Administered 2015-12-05: 10:00:00 via INTRAVENOUS
  Filled 2015-12-05: qty 1000

## 2015-12-05 MED ORDER — SODIUM CHLORIDE 0.9 % IV SOLN
75.0000 mg/m2 | Freq: Once | INTRAVENOUS | Status: AC
  Administered 2015-12-05: 153 mg via INTRAVENOUS
  Filled 2015-12-05: qty 103

## 2015-12-05 MED ORDER — PALONOSETRON HCL INJECTION 0.25 MG/5ML
0.2500 mg | Freq: Once | INTRAVENOUS | Status: AC
  Administered 2015-12-05: 0.25 mg via INTRAVENOUS
  Filled 2015-12-05: qty 5

## 2015-12-05 MED ORDER — SODIUM CHLORIDE 0.9 % IJ SOLN
10.0000 mL | INTRAMUSCULAR | Status: DC | PRN
Start: 2015-12-05 — End: 2015-12-05
  Administered 2015-12-05: 10 mL via INTRAVENOUS
  Filled 2015-12-05: qty 10

## 2015-12-05 MED ORDER — SODIUM CHLORIDE 0.9 % IV SOLN
495.0000 mg/m2 | Freq: Once | INTRAVENOUS | Status: AC
  Administered 2015-12-05: 1000 mg via INTRAVENOUS
  Filled 2015-12-05: qty 40

## 2015-12-05 MED ORDER — FOSAPREPITANT DIMEGLUMINE INJECTION 150 MG
Freq: Once | INTRAVENOUS | Status: AC
  Administered 2015-12-05: 13:00:00 via INTRAVENOUS
  Filled 2015-12-05: qty 5

## 2015-12-05 NOTE — Progress Notes (Signed)
[place]  Telephone:(336) [phone] Fax:(336) [phone]  ID: Heloise Purpura OB: 1946-10-08  MR#: [id number]  ZDG#:[id number]  Patient Care Team: Venia Carbon, MD as PCP - General (Internal Medicine)  CHIEF COMPLAINT:  Chief Complaint  Patient presents with  . Lung Cancer    INTERVAL HISTORY: Patient returns to clinic today to assess his toleration of cycle 1 of cisplatin and pemetrexed. He had increased nausea and decreased appetite over the past week, but otherwise tolerated his treatment well. He continues to have a chronic cough. He has no neurologic complaints. He denies any fevers. He has no chest pain, shortness of breath, or hemoptysis.  He has no vomiting, constipation, or diarrhea.  Patient offers no specific complaints today.   REVIEW OF SYSTEMS:   Review of Systems  Constitutional: Positive for malaise/fatigue. Negative for fever.  Respiratory: Positive for cough. Negative for hemoptysis, sputum production and shortness of breath.   Cardiovascular: Negative.  Negative for chest pain.  Gastrointestinal: Positive for nausea. Negative for vomiting.  Musculoskeletal: Negative.   Skin: Negative for rash.  Neurological: Positive for weakness.    As per HPI. Otherwise, a complete review of systems is negatve.  PAST MEDICAL HISTORY: Past Medical History  Diagnosis Date  . Lung cancer ([place])   . Arthritis     right arm  . GERD (gastroesophageal reflux disease)     PAST SURGICAL HISTORY: Past Surgical History  Procedure Laterality Date  . Colonoscopy    . Esophagogastroduodenoscopy    . Left shoulder reconstruction  ~1965    after hunting accident    FAMILY HISTORY Family History  Problem Relation Age of Onset  . Heart disease Father   . Diabetes Neg Hx   . Cancer Neg Hx        ADVANCED DIRECTIVES:    HEALTH MAINTENANCE: Social History  Substance Use Topics  . Smoking status: Former Smoker    Quit date: 06/27/1997  . Smokeless  tobacco: Never Used  . Alcohol Use: No     Colonoscopy:  PAP:  Bone density:  Lipid panel:  Allergies  Allergen Reactions  . Codeine Nausea And Vomiting    Dizzy     Current Outpatient Prescriptions  Medication Sig Dispense Refill  . Ascorbic Acid (VITAMIN C) 1000 MG tablet Take 1,000 mg by mouth daily.    . benzonatate (TESSALON) 100 MG capsule Take 1 capsule (100 mg total) by mouth 3 (three) times daily as needed for cough. 30 capsule 0  . Cholecalciferol (VITAMIN D) 2000 UNITS CAPS Take 1 capsule by mouth daily.    Marland Kitchen erlotinib (TARCEVA) 25 MG tablet Take 2 tablets (50 mg total) by mouth daily. Take on an empty stomach 1 hour before meals or 2 hours after. (Patient not taking: Reported on 11/14/2015) 60 tablet 0  . folic acid (FOLVITE) 1 MG tablet Take 1 tablet (1 mg total) by mouth daily. Start 5-7 days before Alimta chemotherapy. Continue until 21 days after Alimta completed. 100 tablet 3  . HYDROcodone-acetaminophen (NORCO/VICODIN) 5-325 MG tablet TK 1 TO 2 TS PO Q 4 H PRF MODERATE PAIN  0  . lactulose (CHRONULAC) 10 GM/15ML solution Take 45 mLs (30 g total) by mouth every 6 (six) hours. 946 mL 1  . lidocaine-prilocaine (EMLA) cream Apply to affected area once 30 g 3  . magnesium oxide (MAG-OX) 400 MG tablet Take 400 mg by mouth daily.    . Multiple Vitamin (MULTIVITAMIN WITH MINERALS) TABS Take 1 tablet  by mouth daily.    . ondansetron (ZOFRAN) 8 MG tablet Take 1 tablet (8 mg total) by mouth 2 (two) times daily as needed (Nausea or vomiting). Start if needed on the third day after chemotherapy. 30 tablet 1  . prochlorperazine (COMPAZINE) 10 MG tablet Take 1 tablet (10 mg total) by mouth every 6 (six) hours as needed (Nausea or vomiting). 30 tablet 1  . ranitidine (ZANTAC) 150 MG tablet TAKE 1 TABLET BY MOUTH TWICE A DAY 60 tablet 11  . vitamin E 400 UNIT capsule Take 400 Units by mouth daily.     No current facility-administered medications for this visit.     OBJECTIVE: Filed Vitals:   11/21/15 0958  BP: 125/80  Pulse: 64  Temp: 97.2 F (36.2 C)  Resp: 18     Body mass index is 27.86 kg/(m^2).    ECOG FS:1 - Symptomatic but completely ambulatory  General: Well-developed, well-nourished, no acute distress. Eyes: anicteric sclera. Lungs: Clear to auscultation bilaterally. Heart: Regular rate and rhythm. No rubs, murmurs, or gallops. Abdomen: Soft, nontender, nondistended. No organomegaly noted, normoactive bowel sounds. Musculoskeletal: No edema, cyanosis, or clubbing. Neuro: Alert, answering all questions appropriately. Cranial nerves grossly intact. Skin: No rashes or petechiae noted. Psych: Normal affect.  LAB RESULTS:  Lab Results  Component Value Date   NA 131* 11/21/2015   K 4.2 11/21/2015   CL 97* 11/21/2015   CO2 27 11/21/2015   GLUCOSE 118* 11/21/2015   BUN 12 11/21/2015   CREATININE 0.97 11/21/2015   CALCIUM 9.0 11/21/2015   PROT 7.0 11/21/2015   ALBUMIN 4.2 11/21/2015   AST 24 11/21/2015   ALT 35 11/21/2015   ALKPHOS 83 11/21/2015   BILITOT 0.6 11/21/2015   GFRNONAA >60 11/21/2015   GFRAA >60 11/21/2015    Lab Results  Component Value Date   WBC 4.1 11/21/2015   NEUTROABS 3.2 11/21/2015   HGB 13.2 11/21/2015   HCT 38.4* 11/21/2015   MCV 82.3 11/21/2015   PLT 232 11/21/2015     STUDIES: Nm Pet Image Restag (ps) Skull Base To Thigh  11/05/2015  CLINICAL DATA:  Subsequent treatment strategy for right lung carcinoma. Recent radiation therapy and previous chemotherapy. Previous left lung carcinoma. EXAM: NUCLEAR MEDICINE PET SKULL BASE TO THIGH TECHNIQUE: 12.6 mCi F-18 FDG was injected intravenously. Full-ring PET imaging was performed from the skull base to thigh after the radiotracer. CT data was obtained and used for attenuation correction and anatomic localization. FASTING BLOOD GLUCOSE:  Value: 82 mg/dl COMPARISON:  CT on 09/23/2015 and 06/19/2015 FINDINGS: NECK No hypermetabolic lymph nodes in the  neck. CHEST Opacity and volume loss in the posterior left upper lung remains stable and shows low-grade hypermetabolic activity, consistent with radiation changes. New hypermetabolic right hilar lymphadenopathy is seen measuring 2.0 cm in short axis on image 104/series 3. This has SUV max of 9.8. A 2.1 cm spiculated pulmonary nodule seen in the central right upper lobe has increased in size from 9 mm 06/19/2015 exam, and now shows a central focus of cavitation. This has an SUV max of 12.8, suspicious for bronchogenic carcinoma. Continued decrease in multiple ill-defined nodules and masses in the peripheral right upper lobe and right lung apex compared to previous studies. Largest residual masslike opacity in the posterior right lung apex measures 2.0 cm on image study 9/series 3 compared to 2.5 cm previously. This shows low-grade hypermetabolic activity with SUV max of 3.2. ABDOMEN/PELVIS No abnormal hypermetabolic activity within the liver, pancreas, adrenal glands,  or spleen. No hypermetabolic lymph nodes in the abdomen or pelvis. SKELETON No focal hypermetabolic activity to suggest skeletal metastasis. IMPRESSION: Increased size of 2.1 cm hypermetabolic central right upper lobe nodule with central cavitation, suspicious for bronchogenic carcinoma. New hypermetabolic right hilar lymphadenopathy, consistent with metastatic disease. No hypermetabolic mediastinal lymph nodes or distant metastatic disease. Decreased size of multiple ill-defined nodules and masses in right upper lobe and right lung apex, consistent with decreased carcinoma. Stable left lung post radiation changes. Electronically Signed   By: Earle Gell M.D.   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  PLAN:    1.  Lung cancer:  PET scan results reviewed independently and reported as above with likely progression of disease. Patient tolerated cycle 1 of  cisplatin and pemetrexed relatively well other than some increased nausea, weakness and fatigue. Return to clinic in 2 weeks for laboratory work and consideration of cycle 2. Patient will receive this regimen every 3 weeks and then reimage after approximately 4 cycles.   2.  Rash:  Secondary to Tarceva which has been discontinued. Resolved. 3.  Hypomagnesemia: Resolved. 4.  Pneumonia: Resolved.  5. Cough: Patient states that OTC medications and Tessalon Perles do not work. He has an allergy to codeine. Treatment as above. 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 Consider prednisone in the future. 6.  Nausea: Continue Zofran and Compazine as needed.   Patient expressed understanding and was in agreement with this plan. He also understands that He can call clinic at any time with any questions, concerns, or complaints.    Lloyd Huger, MD   12/05/2015 7:47 AM

## 2015-12-05 NOTE — Progress Notes (Signed)
[place]  Telephone:(336) [phone] Fax:(336) [phone]  ID: Christopher Huber OB: [birth year]  MR#: [id number]  ZYY#:[id number]  Patient Care Team: Venia Carbon, MD as PCP - General (Internal Medicine)  CHIEF COMPLAINT:  Chief Complaint  Patient presents with  . Lung Cancer    INTERVAL HISTORY: Patient returns to clinic today for consideration of cycle 2 of cisplatin and pemetrexed. His constipation has resolved with lactulose and he feels back to his baseline. He continues to have a chronic cough. He has no neurologic complaints. He denies any fevers. He has no chest pain, shortness of breath, or hemoptysis.  He has no nausea, vomiting, or diarrhea.  Patient offers no specific complaints today.   REVIEW OF SYSTEMS:   Review of Systems  Constitutional: Positive for malaise/fatigue. Negative for fever.  Respiratory: Positive for cough. Negative for hemoptysis, sputum production and shortness of breath.   Cardiovascular: Negative.  Negative for chest pain.  Gastrointestinal: Negative for nausea, vomiting and constipation.  Musculoskeletal: Negative.   Skin: Negative for rash.  Neurological: Positive for weakness.    As per HPI. Otherwise, a complete review of systems is negatve.  PAST MEDICAL HISTORY: Past Medical History  Diagnosis Date  . Lung cancer ([place])   . Arthritis     right arm  . GERD (gastroesophageal reflux disease)     PAST SURGICAL HISTORY: Past Surgical History  Procedure Laterality Date  . Colonoscopy    . Esophagogastroduodenoscopy    . Left shoulder reconstruction  ~1965    after hunting accident    FAMILY HISTORY Family History  Problem Relation Age of Onset  . Heart disease Father   . Diabetes Neg Hx   . Cancer Neg Hx        ADVANCED DIRECTIVES:    HEALTH MAINTENANCE: Social History  Substance Use Topics  . Smoking status: Former Smoker    Quit date: 06/27/1997  . Smokeless tobacco: Never Used  . Alcohol Use: No       Colonoscopy:  PAP:  Bone density:  Lipid panel:  Allergies  Allergen Reactions  . Codeine Nausea And Vomiting    Dizzy     Current Outpatient Prescriptions  Medication Sig Dispense Refill  . Ascorbic Acid (VITAMIN C) 1000 MG tablet Take 1,000 mg by mouth daily.    . benzonatate (TESSALON) 100 MG capsule Take 1 capsule (100 mg total) by mouth 3 (three) times daily as needed for cough. 30 capsule 0  . Cholecalciferol (VITAMIN D) 2000 UNITS CAPS Take 1 capsule by mouth daily.    . folic acid (FOLVITE) 1 MG tablet Take 1 tablet (1 mg total) by mouth daily. Start 5-7 days before Alimta chemotherapy. Continue until 21 days after Alimta completed. 100 tablet 3  . HYDROcodone-acetaminophen (NORCO/VICODIN) 5-325 MG tablet TK 1 TO 2 TS PO Q 4 H PRF MODERATE PAIN  0  . lactulose (CHRONULAC) 10 GM/15ML solution Take 45 mLs (30 g total) by mouth every 6 (six) hours. 946 mL 1  . lidocaine-prilocaine (EMLA) cream Apply to affected area once 30 g 3  . magnesium oxide (MAG-OX) 400 MG tablet Take 400 mg by mouth daily.    . Multiple Vitamin (MULTIVITAMIN WITH MINERALS) TABS Take 1 tablet by mouth daily.    . ondansetron (ZOFRAN) 8 MG tablet Take 1 tablet (8 mg total) by mouth 2 (two) times daily as needed (Nausea or vomiting). Start if needed on the third day after chemotherapy. 30 tablet 1  .  prochlorperazine (COMPAZINE) 10 MG tablet Take 1 tablet (10 mg total) by mouth every 6 (six) hours as needed (Nausea or vomiting). 30 tablet 1  . ranitidine (ZANTAC) 150 MG tablet TAKE 1 TABLET BY MOUTH TWICE A DAY 60 tablet 11  . vitamin E 400 UNIT capsule Take 400 Units by mouth daily.     No current facility-administered medications for this visit.   Facility-Administered Medications Ordered in Other Visits  Medication Dose Route Frequency Provider Last Rate Last Dose  . heparin lock flush 100 unit/mL  500 Units Intravenous Once Lloyd Huger, MD      . sodium chloride 0.9 % injection 10 mL  10 mL  Intravenous PRN Lloyd Huger, MD   10 mL at 12/05/15 0835    OBJECTIVE: Filed Vitals:   12/05/15 0902  BP: 129/86  Pulse: 68  Temp: 96.8 F (36 C)  Resp: 18     Body mass index is 28.25 kg/(m^2).    ECOG FS:1 - Symptomatic but completely ambulatory  General: Well-developed, well-nourished, no acute distress. Eyes: anicteric sclera. Lungs: Clear to auscultation bilaterally. Heart: Regular rate and rhythm. No rubs, murmurs, or gallops. Abdomen: Soft, nontender, nondistended. No organomegaly noted, normoactive bowel sounds. Musculoskeletal: No edema, cyanosis, or clubbing. Neuro: Alert, answering all questions appropriately. Cranial nerves grossly intact. Skin: No rashes or petechiae noted. Psych: Normal affect.  LAB RESULTS:  Lab Results  Component Value Date   NA 132* 12/05/2015   K 4.1 12/05/2015   CL 101 12/05/2015   CO2 27 12/05/2015   GLUCOSE 92 12/05/2015   BUN 11 12/05/2015   CREATININE 0.82 12/05/2015   CALCIUM 8.9 12/05/2015   PROT 6.7 12/05/2015   ALBUMIN 3.9 12/05/2015   AST 16 12/05/2015   ALT 12* 12/05/2015   ALKPHOS 79 12/05/2015   BILITOT 0.5 12/05/2015   GFRNONAA >60 12/05/2015   GFRAA >60 12/05/2015    Lab Results  Component Value Date   WBC 3.2* 12/05/2015   NEUTROABS 2.1 12/05/2015   HGB 12.8* 12/05/2015   HCT 37.8* 12/05/2015   MCV 84.0 12/05/2015   PLT 319 12/05/2015     STUDIES: Nm Pet Image Restag (ps) Skull Base To Thigh  11/05/2015  CLINICAL DATA:  Subsequent treatment strategy for right lung carcinoma. Recent radiation therapy and previous chemotherapy. Previous left lung carcinoma. EXAM: NUCLEAR MEDICINE PET SKULL BASE TO THIGH TECHNIQUE: 12.6 mCi F-18 FDG was injected intravenously. Full-ring PET imaging was performed from the skull base to thigh after the radiotracer. CT data was obtained and used for attenuation correction and anatomic localization. FASTING BLOOD GLUCOSE:  Value: 82 mg/dl COMPARISON:  CT on 09/23/2015 and  06/19/2015 FINDINGS: NECK No hypermetabolic lymph nodes in the neck. CHEST Opacity and volume loss in the posterior left upper lung remains stable and shows low-grade hypermetabolic activity, consistent with radiation changes. New hypermetabolic right hilar lymphadenopathy is seen measuring 2.0 cm in short axis on image 104/series 3. This has SUV max of 9.8. A 2.1 cm spiculated pulmonary nodule seen in the central right upper lobe has increased in size from 9 mm 06/19/2015 exam, and now shows a central focus of cavitation. This has an SUV max of 12.8, suspicious for bronchogenic carcinoma. Continued decrease in multiple ill-defined nodules and masses in the peripheral right upper lobe and right lung apex compared to previous studies. Largest residual masslike opacity in the posterior right lung apex measures 2.0 cm on image study 9/series 3 compared to 2.5 cm previously. This  shows low-grade hypermetabolic activity with SUV max of 3.2. ABDOMEN/PELVIS No abnormal hypermetabolic activity within the liver, pancreas, adrenal glands, or spleen. No hypermetabolic lymph nodes in the abdomen or pelvis. SKELETON No focal hypermetabolic activity to suggest skeletal metastasis. IMPRESSION: Increased size of 2.1 cm hypermetabolic central right upper lobe nodule with central cavitation, suspicious for bronchogenic carcinoma. New hypermetabolic right hilar lymphadenopathy, consistent with metastatic disease. No hypermetabolic mediastinal lymph nodes or distant metastatic disease. Decreased size of multiple ill-defined nodules and masses in right upper lobe and right lung apex, consistent with decreased carcinoma. Stable left lung post radiation changes. Electronically Signed   By: Earle Gell M.D.   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  PLAN:    1.  Lung cancer:  PET scan results reviewed independently and reported as above with  likely progression of disease. Proceed with cycle 2 of cisplatin and pemetrexed today. Return to clinic in 3 weeks for laboratory work and consideration of cycle 3. Patient will receive this regimen every 3 weeks and then reimage after approximately 4 cycles.   2.  Rash:  Resolved.  Secondary to Tarceva which has been discontinued.  3.  Hypomagnesemia: Resolved. 4.  Cough: Patient states that OTC medications and Tessalon Perles do not work. He has an allergy to codeine. 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 Consider prednisone in the future. 5.  Nausea: Continue Zofran and Compazine as needed. 6.  Constipation: Improved. Continue stool softeners and Miralax.  Lactulose as needed.   Patient expressed understanding and was in agreement with this plan. He also understands that He can call clinic at any time with any questions, concerns, or complaints.    Lloyd Huger, MD   12/05/2015 9:19 AM

## 2015-12-05 NOTE — Progress Notes (Signed)
Patient's constipation is better after taking the Lactulose.  Has occasional back pain that is accompanied by the cough.

## 2015-12-06 ENCOUNTER — Other Ambulatory Visit: Payer: Self-pay | Admitting: *Deleted

## 2015-12-06 MED ORDER — TRIAMCINOLONE ACETONIDE 0.1 % EX CREA: 1.0000 "application " | TOPICAL_CREAM | Freq: Two times a day (BID) | CUTANEOUS | Status: DC

## 2015-12-26 ENCOUNTER — Other Ambulatory Visit: Payer: Self-pay | Admitting: *Deleted

## 2015-12-26 ENCOUNTER — Encounter: Payer: Self-pay | Admitting: Radiation Oncology

## 2015-12-26 ENCOUNTER — Ambulatory Visit
Admission: RE | Admit: 2015-12-26 | Discharge: 2015-12-26 | Disposition: A | Discharge status: Home / Self Care | Payer: Medicare Other | Source: Ambulatory Visit | Attending: Radiation Oncology | Admitting: Radiation Oncology

## 2015-12-26 ENCOUNTER — Inpatient Hospital Stay: Payer: Medicare Other

## 2015-12-26 ENCOUNTER — Inpatient Hospital Stay: Payer: Medicare Other | Attending: Oncology

## 2015-12-26 ENCOUNTER — Inpatient Hospital Stay (HOSPITAL_BASED_OUTPATIENT_CLINIC_OR_DEPARTMENT_OTHER): Payer: Medicare Other | Admitting: Oncology

## 2015-12-26 VITALS — BP 132/78 | HR 62 | Temp 97.4°F | Wt 190.6 lb

## 2015-12-26 DIAGNOSIS — R5381 Other malaise: Secondary | ICD-10-CM | POA: Insufficient documentation

## 2015-12-26 DIAGNOSIS — K5900 Constipation, unspecified: Secondary | ICD-10-CM

## 2015-12-26 DIAGNOSIS — M1990 Unspecified osteoarthritis, unspecified site: Secondary | ICD-10-CM | POA: Insufficient documentation

## 2015-12-26 DIAGNOSIS — Z5111 Encounter for antineoplastic chemotherapy: Secondary | ICD-10-CM | POA: Diagnosis not present

## 2015-12-26 DIAGNOSIS — Z79899 Other long term (current) drug therapy: Secondary | ICD-10-CM

## 2015-12-26 DIAGNOSIS — C3412 Malignant neoplasm of upper lobe, left bronchus or lung: Secondary | ICD-10-CM

## 2015-12-26 DIAGNOSIS — R110 Nausea: Secondary | ICD-10-CM

## 2015-12-26 DIAGNOSIS — Z923 Personal history of irradiation: Secondary | ICD-10-CM | POA: Diagnosis not present

## 2015-12-26 DIAGNOSIS — R05 Cough: Secondary | ICD-10-CM

## 2015-12-26 DIAGNOSIS — C3411 Malignant neoplasm of upper lobe, right bronchus or lung: Secondary | ICD-10-CM

## 2015-12-26 DIAGNOSIS — Z85118 Personal history of other malignant neoplasm of bronchus and lung: Secondary | ICD-10-CM

## 2015-12-26 DIAGNOSIS — R531 Weakness: Secondary | ICD-10-CM | POA: Insufficient documentation

## 2015-12-26 DIAGNOSIS — K219 Gastro-esophageal reflux disease without esophagitis: Secondary | ICD-10-CM | POA: Insufficient documentation

## 2015-12-26 DIAGNOSIS — Z87891 Personal history of nicotine dependence: Secondary | ICD-10-CM

## 2015-12-26 LAB — CBC WITH DIFFERENTIAL/PLATELET
Basophils Absolute: 0 10*3/uL (ref 0–0.1)
Basophils Relative: 1 %
EOS ABS: 0.1 10*3/uL (ref 0–0.7)
Eosinophils Relative: 2 %
HEMATOCRIT: 35.3 % — AB (ref 40.0–52.0)
HEMOGLOBIN: 12 g/dL — AB (ref 13.0–18.0)
Lymphocytes Relative: 13 %
Lymphs Abs: 0.4 10*3/uL — ABNORMAL LOW (ref 1.0–3.6)
MCH: 28.9 pg (ref 26.0–34.0)
MCHC: 34 g/dL (ref 32.0–36.0)
MCV: 85 fL (ref 80.0–100.0)
MONOS PCT: 23 %
Monocytes Absolute: 0.7 10*3/uL (ref 0.2–1.0)
NEUTROS PCT: 61 %
Neutro Abs: 1.8 10*3/uL (ref 1.4–6.5)
Platelets: 289 10*3/uL (ref 150–440)
RBC: 4.15 MIL/uL — ABNORMAL LOW (ref 4.40–5.90)
RDW: 14.5 % (ref 11.5–14.5)
WBC: 3 10*3/uL — ABNORMAL LOW (ref 3.8–10.6)

## 2015-12-26 LAB — COMPREHENSIVE METABOLIC PANEL
ALK PHOS: 74 U/L (ref 38–126)
ALT: 20 U/L (ref 17–63)
ANION GAP: 5 (ref 5–15)
AST: 18 U/L (ref 15–41)
Albumin: 3.7 g/dL (ref 3.5–5.0)
BILIRUBIN TOTAL: 0.4 mg/dL (ref 0.3–1.2)
BUN: 13 mg/dL (ref 6–20)
CALCIUM: 8.9 mg/dL (ref 8.9–10.3)
CO2: 27 mmol/L (ref 22–32)
CREATININE: 0.81 mg/dL (ref 0.61–1.24)
Chloride: 102 mmol/L (ref 101–111)
Glucose, Bld: 103 mg/dL — ABNORMAL HIGH (ref 65–99)
Potassium: 4.1 mmol/L (ref 3.5–5.1)
SODIUM: 134 mmol/L — AB (ref 135–145)
TOTAL PROTEIN: 6.5 g/dL (ref 6.5–8.1)

## 2015-12-26 MED ORDER — SODIUM CHLORIDE 0.9 % IV SOLN
Freq: Once | INTRAVENOUS | Status: AC
  Administered 2015-12-26: 13:00:00 via INTRAVENOUS
  Filled 2015-12-26: qty 5

## 2015-12-26 MED ORDER — CYANOCOBALAMIN 1000 MCG/ML IJ SOLN
1000.0000 ug | Freq: Once | INTRAMUSCULAR | Status: AC
  Administered 2015-12-26: 1000 ug via INTRAMUSCULAR
  Filled 2015-12-26: qty 1

## 2015-12-26 MED ORDER — SODIUM CHLORIDE 0.9 % IJ SOLN
10.0000 mL | Freq: Once | INTRAMUSCULAR | Status: AC
  Administered 2015-12-26: 10 mL via INTRAVENOUS
  Filled 2015-12-26: qty 10

## 2015-12-26 MED ORDER — POTASSIUM CHLORIDE 2 MEQ/ML IV SOLN
Freq: Once | INTRAVENOUS | Status: AC
  Administered 2015-12-26: 11:00:00 via INTRAVENOUS
  Filled 2015-12-26: qty 1000

## 2015-12-26 MED ORDER — SODIUM CHLORIDE 0.9 % IV SOLN
1000.0000 mg | Freq: Once | INTRAVENOUS | Status: AC
  Administered 2015-12-26: 1000 mg via INTRAVENOUS
  Filled 2015-12-26: qty 40

## 2015-12-26 MED ORDER — SODIUM CHLORIDE 0.9 % IV SOLN
75.0000 mg/m2 | Freq: Once | INTRAVENOUS | Status: AC
  Administered 2015-12-26: 153 mg via INTRAVENOUS
  Filled 2015-12-26: qty 153

## 2015-12-26 MED ORDER — HEPARIN SOD (PORK) LOCK FLUSH 100 UNIT/ML IV SOLN
500.0000 [IU] | Freq: Once | INTRAVENOUS | Status: AC
Start: 2015-12-26 — End: 2015-12-26
  Administered 2015-12-26: 500 [IU] via INTRAVENOUS
  Filled 2015-12-26: qty 5

## 2015-12-26 MED ORDER — PALONOSETRON HCL INJECTION 0.25 MG/5ML
0.2500 mg | Freq: Once | INTRAVENOUS | Status: AC
  Administered 2015-12-26: 0.25 mg via INTRAVENOUS
  Filled 2015-12-26: qty 5

## 2015-12-26 MED ORDER — SODIUM CHLORIDE 0.9 % IV SOLN
Freq: Once | INTRAVENOUS | Status: AC
  Administered 2015-12-26: 11:00:00 via INTRAVENOUS
  Filled 2015-12-26: qty 1000

## 2015-12-26 NOTE — Progress Notes (Signed)
Radiation Oncology Follow up Note  Name: Christopher Huber   Date:   12/26/2015 MRN:  [id number] DOB: June 29, 1946    This 70 y.o. male presents to the clinic today for follow-up for lung cancer.  REFERRING PROVIDER: Adrian Prows, MD  HPI: Patient is a 70 year old male now out over 2 years having been treated with combined modality treatment chemoradiation for stage IIIB non-small cell lung cancer. He developed a right upper lobe lesion which was again positive for poorly differentiated non-small cell carcinoma consistent with adenocarcinoma with sarcomatoid spindle cell component. He is now seen out 4 months since completing SB RT. Unfortunately. Recent PET CT scan performed in November 2016 showed increase in 2.1 cm hypermetabolic right central lobe nodule central cavitation and a new hypermetabolic right hilar lymphadenopathy consistent with metastatic disease. No other mediastinal or distant hypermetabolic activity is seen. He is currently on salvage chemotherapy with pemetrexed and cisplatin. He is tolerating that well without side effect. He specifically denies cough hemoptysis or chest tightness. Does have some pain in his right shoulder which responded well to initial radiation therapy has returned although to a much lesser degree.  COMPLICATIONS OF TREATMENT: none  FOLLOW UP COMPLIANCE: keeps appointments   PHYSICAL EXAM:  BP 132/78 mmHg  Pulse 62  Temp(Src) 97.4 F (36.3 C)  Wt 190 lb 9.4 oz (86.45 kg) Well-developed well-nourished patient in NAD. HEENT reveals PERLA, EOMI, discs not visualized.  Oral cavity is clear. No oral mucosal lesions are identified. Neck is clear without evidence of cervical or supraclavicular adenopathy. Lungs are clear to A&P. Cardiac examination is essentially unremarkable with regular rate and rhythm without murmur rub or thrill. Abdomen is benign with no organomegaly or masses noted. Motor sensory and DTR levels are equal and symmetric in the upper and  lower extremities. Cranial nerves II through XII are grossly intact. Proprioception is intact. No peripheral adenopathy or edema is identified. No motor or sensory levels are noted. Crude visual fields are within normal range.  RADIOLOGY RESULTS: Recent PET CT scan is reviewed and compatible above-stated findings  PLAN: I discussed the case personally with medical oncology. Believe we can again salvage with radiation therapy to an area outside her previous salvage treatment field with possible again SB RT treatment. I like to wait another 3 months to evaluate his response to systemic treatment. I have asked to see him back in 3 months and have ordered a PET CT scan about 2 weeks prior to his visit. Will make further further recommendations based on those scans. Again Dr. Grayland Ormond was made aware of my treatment plan will continue systemic chemotherapy as already planned. Patient knows to call with any concerns.  I would like to take this opportunity for allowing me to participate in the care of your patient.Armstead Peaks., MD

## 2015-12-26 NOTE — Progress Notes (Signed)
After last treatment patient was feeling fatigued and nausea that lasted 2 weeks.  He is still using the Lactulose with good response.

## 2015-12-28 ENCOUNTER — Ambulatory Visit: Payer: Self-pay | Admitting: Radiation Oncology

## 2015-12-28 NOTE — Progress Notes (Signed)
[place]  [phone] [phone]  ID: Christopher Huber OB: 1946-04-20  MR#: [id number]  EHU#:[id number]  Patient Care Team: Venia Carbon, MD as PCP - General (Internal Medicine)  CHIEF COMPLAINT:  Chief Complaint  Patient presents with  . Lung Cancer    INTERVAL HISTORY: Patient returns to clinic today for consideration of cycle 3 of cisplatin and pemetrexed. His constipation has resolved with lactulose. He had increased weakness and nausea for the 2 weeks after his last treatment, but now feels back to his baseline. He continues to have a chronic cough. He has no neurologic complaints. He denies any fevers. He has no chest pain, shortness of breath, or hemoptysis.  He has no vomiting or diarrhea.  Patient offers no further specific complaints today.   REVIEW OF SYSTEMS:   Review of Systems  Constitutional: Positive for malaise/fatigue. Negative for fever.  Respiratory: Positive for cough. Negative for hemoptysis, sputum production and shortness of breath.   Cardiovascular: Negative.  Negative for chest pain.  Gastrointestinal: Positive for nausea. Negative for vomiting and constipation.  Musculoskeletal: Negative.   Skin: Negative for rash.  Neurological: Negative.     As per HPI. Otherwise, a complete review of systems is negatve.  PAST MEDICAL HISTORY: Past Medical History  Diagnosis Date  . Lung cancer ([place])   . Arthritis     right arm  . GERD (gastroesophageal reflux disease)     PAST SURGICAL HISTORY: Past Surgical History  Procedure Laterality Date  . Colonoscopy    . Esophagogastroduodenoscopy    . Left shoulder reconstruction  ~1965    after hunting accident    FAMILY HISTORY Family History  Problem Relation Age of Onset  . Heart disease Father   . Diabetes Neg Hx   . Cancer Neg Hx        ADVANCED DIRECTIVES:    HEALTH MAINTENANCE: Social History  Substance Use Topics  . Smoking status: Former Smoker      Quit date: 06/27/1997  . Smokeless tobacco: Never Used  . Alcohol Use: No     Colonoscopy:  PAP:  Bone density:  Lipid panel:  Allergies  Allergen Reactions  . Codeine Nausea And Vomiting    Dizzy     Current Outpatient Prescriptions  Medication Sig Dispense Refill  . Ascorbic Acid (VITAMIN C) 1000 MG tablet Take 1,000 mg by mouth daily.    . benzonatate (TESSALON) 100 MG capsule Take 1 capsule (100 mg total) by mouth 3 (three) times daily as needed for cough. 30 capsule 0  . Cholecalciferol (VITAMIN D) 2000 UNITS CAPS Take 1 capsule by mouth daily.    . folic acid (FOLVITE) 1 MG tablet Take 1 tablet (1 mg total) by mouth daily. Start 5-7 days before Alimta chemotherapy. Continue until 21 days after Alimta completed. 100 tablet 3  . HYDROcodone-acetaminophen (NORCO/VICODIN) 5-325 MG tablet TK 1 TO 2 TS PO Q 4 H PRF MODERATE PAIN  0  . lactulose (CHRONULAC) 10 GM/15ML solution Take 45 mLs (30 g total) by mouth every 6 (six) hours. 946 mL 1  . lidocaine-prilocaine (EMLA) cream Apply to affected area once 30 g 3  . magnesium oxide (MAG-OX) 400 MG tablet Take 400 mg by mouth daily.    . Multiple Vitamin (MULTIVITAMIN WITH MINERALS) TABS Take 1 tablet by mouth daily.    . ondansetron (ZOFRAN) 8 MG tablet Take 1 tablet (8 mg total) by mouth 2 (two) times daily as needed (Nausea or  vomiting). Start if needed on the third day after chemotherapy. 30 tablet 1  . prochlorperazine (COMPAZINE) 10 MG tablet Take 1 tablet (10 mg total) by mouth every 6 (six) hours as needed (Nausea or vomiting). 30 tablet 1  . ranitidine (ZANTAC) 150 MG tablet TAKE 1 TABLET BY MOUTH TWICE A DAY 60 tablet 11  . triamcinolone cream (KENALOG) 0.1 % Apply 1 application topically 2 (two) times daily. 80 g 1  . vitamin E 400 UNIT capsule Take 400 Units by mouth daily.     No current facility-administered medications for this visit.    OBJECTIVE: There were no vitals filed for this visit.   There is no weight on  file to calculate BMI.    ECOG FS:1 - Symptomatic but completely ambulatory  General: Well-developed, well-nourished, no acute distress. Eyes: anicteric sclera. Lungs: Clear to auscultation bilaterally. Heart: Regular rate and rhythm. No rubs, murmurs, or gallops. Abdomen: Soft, nontender, nondistended. No organomegaly noted, normoactive bowel sounds. Musculoskeletal: No edema, cyanosis, or clubbing. Neuro: Alert, answering all questions appropriately. Cranial nerves grossly intact. Skin: No rashes or petechiae noted. Psych: Normal affect.  LAB RESULTS:  Lab Results  Component Value Date   NA 134* 12/26/2015   K 4.1 12/26/2015   CL 102 12/26/2015   CO2 27 12/26/2015   GLUCOSE 103* 12/26/2015   BUN 13 12/26/2015   CREATININE 0.81 12/26/2015   CALCIUM 8.9 12/26/2015   PROT 6.5 12/26/2015   ALBUMIN 3.7 12/26/2015   AST 18 12/26/2015   ALT 20 12/26/2015   ALKPHOS 74 12/26/2015   BILITOT 0.4 12/26/2015   GFRNONAA >60 12/26/2015   GFRAA >60 12/26/2015    Lab Results  Component Value Date   WBC 3.0* 12/26/2015   NEUTROABS 1.8 12/26/2015   HGB 12.0* 12/26/2015   HCT 35.3* 12/26/2015   MCV 85.0 12/26/2015   PLT 289 12/26/2015     STUDIES: No results found.  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  PLAN:    1.  Lung cancer:  PET scan results reviewed independently with likely progression of disease. Proceed with cycle 3 of cisplatin and pemetrexed today. Return to clinic in 3 weeks for laboratory work and consideration of cycle 4. Patient will receive this regimen every 3 weeks and then reimage after approximately 4 cycles.   Patient also may benefit from additional XRT at the conclusion of his chemotherapy. 2.  Rash:  Resolved.  Secondary to Tarceva which has been discontinued.  3.  Hypomagnesemia: Resolved. 4.  Cough: Patient states that OTC medications and Tessalon Perles do not work. He has an allergy to  codeine. 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 Consider prednisone in the future. 5.  Nausea: Continue Zofran and Compazine as needed. 6.  Constipation: Improved. Continue stool softeners and Miralax.  Lactulose as needed.   Patient expressed understanding and was in agreement with this plan. He also understands that He can call clinic at any time with any questions, concerns, or complaints.    Lloyd Huger, MD   12/28/2015 2:36 PM

## 2015-12-31 ENCOUNTER — Telehealth: Payer: Self-pay | Admitting: *Deleted

## 2015-12-31 NOTE — Telephone Encounter (Signed)
Called to report "I am stopped up and my stomach is cramping, I can't take it, the medicine he gave me is not working" Advised to go to ER for evaluation

## 2016-01-16 ENCOUNTER — Inpatient Hospital Stay: Payer: Medicare Other

## 2016-01-16 ENCOUNTER — Inpatient Hospital Stay (HOSPITAL_BASED_OUTPATIENT_CLINIC_OR_DEPARTMENT_OTHER): Payer: Medicare Other | Admitting: Oncology

## 2016-01-16 ENCOUNTER — Other Ambulatory Visit: Payer: Self-pay | Admitting: *Deleted

## 2016-01-16 ENCOUNTER — Inpatient Hospital Stay: Payer: Medicare Other | Attending: Oncology

## 2016-01-16 VITALS — BP 114/82 | HR 98 | Resp 82 | Wt 190.0 lb

## 2016-01-16 DIAGNOSIS — Z87891 Personal history of nicotine dependence: Secondary | ICD-10-CM | POA: Insufficient documentation

## 2016-01-16 DIAGNOSIS — Z79899 Other long term (current) drug therapy: Secondary | ICD-10-CM

## 2016-01-16 DIAGNOSIS — C3411 Malignant neoplasm of upper lobe, right bronchus or lung: Secondary | ICD-10-CM

## 2016-01-16 DIAGNOSIS — M1990 Unspecified osteoarthritis, unspecified site: Secondary | ICD-10-CM | POA: Insufficient documentation

## 2016-01-16 DIAGNOSIS — R05 Cough: Secondary | ICD-10-CM | POA: Diagnosis not present

## 2016-01-16 DIAGNOSIS — R110 Nausea: Secondary | ICD-10-CM | POA: Insufficient documentation

## 2016-01-16 DIAGNOSIS — Z5111 Encounter for antineoplastic chemotherapy: Secondary | ICD-10-CM | POA: Insufficient documentation

## 2016-01-16 DIAGNOSIS — K219 Gastro-esophageal reflux disease without esophagitis: Secondary | ICD-10-CM | POA: Insufficient documentation

## 2016-01-16 DIAGNOSIS — G4700 Insomnia, unspecified: Secondary | ICD-10-CM | POA: Insufficient documentation

## 2016-01-16 DIAGNOSIS — R5381 Other malaise: Secondary | ICD-10-CM | POA: Insufficient documentation

## 2016-01-16 DIAGNOSIS — R5383 Other fatigue: Secondary | ICD-10-CM | POA: Insufficient documentation

## 2016-01-16 DIAGNOSIS — M25511 Pain in right shoulder: Secondary | ICD-10-CM | POA: Insufficient documentation

## 2016-01-16 DIAGNOSIS — M549 Dorsalgia, unspecified: Secondary | ICD-10-CM | POA: Diagnosis not present

## 2016-01-16 DIAGNOSIS — R531 Weakness: Secondary | ICD-10-CM | POA: Insufficient documentation

## 2016-01-16 DIAGNOSIS — K5900 Constipation, unspecified: Secondary | ICD-10-CM | POA: Diagnosis not present

## 2016-01-16 LAB — CBC WITH DIFFERENTIAL/PLATELET
BASOS ABS: 0 10*3/uL (ref 0–0.1)
BASOS PCT: 1 %
EOS ABS: 0 10*3/uL (ref 0–0.7)
EOS PCT: 1 %
HCT: 34.1 % — ABNORMAL LOW (ref 40.0–52.0)
Hemoglobin: 11.8 g/dL — ABNORMAL LOW (ref 13.0–18.0)
Lymphocytes Relative: 13 %
Lymphs Abs: 0.4 10*3/uL — ABNORMAL LOW (ref 1.0–3.6)
MCH: 29.9 pg (ref 26.0–34.0)
MCHC: 34.6 g/dL (ref 32.0–36.0)
MCV: 86.6 fL (ref 80.0–100.0)
MONO ABS: 0.6 10*3/uL (ref 0.2–1.0)
Monocytes Relative: 22 %
NEUTROS ABS: 1.8 10*3/uL (ref 1.4–6.5)
Neutrophils Relative %: 63 %
PLATELETS: 286 10*3/uL (ref 150–440)
RBC: 3.94 MIL/uL — ABNORMAL LOW (ref 4.40–5.90)
RDW: 16.3 % — AB (ref 11.5–14.5)
WBC: 2.8 10*3/uL — ABNORMAL LOW (ref 3.8–10.6)

## 2016-01-16 LAB — COMPREHENSIVE METABOLIC PANEL
ALBUMIN: 3.9 g/dL (ref 3.5–5.0)
ALT: 13 U/L — ABNORMAL LOW (ref 17–63)
ANION GAP: 6 (ref 5–15)
AST: 14 U/L — AB (ref 15–41)
Alkaline Phosphatase: 83 U/L (ref 38–126)
BUN: 12 mg/dL (ref 6–20)
CHLORIDE: 100 mmol/L — AB (ref 101–111)
CO2: 26 mmol/L (ref 22–32)
Calcium: 9 mg/dL (ref 8.9–10.3)
Creatinine, Ser: 0.78 mg/dL (ref 0.61–1.24)
GFR calc Af Amer: >60 mL/min (ref >60)
GFR calc non Af Amer: >60 mL/min (ref >60)
GLUCOSE: 95 mg/dL (ref 65–99)
POTASSIUM: 4.4 mmol/L (ref 3.5–5.1)
Sodium: 132 mmol/L — ABNORMAL LOW (ref 135–145)
Total Bilirubin: 0.5 mg/dL (ref 0.3–1.2)
Total Protein: 6.6 g/dL (ref 6.5–8.1)

## 2016-01-16 MED ORDER — SODIUM CHLORIDE 0.9 % IJ SOLN
10.0000 mL | INTRAMUSCULAR | Status: DC | PRN
  Administered 2016-01-16: 10 mL
  Filled 2016-01-16: qty 10

## 2016-01-16 MED ORDER — PEMETREXED DISODIUM CHEMO INJECTION 500 MG
1000.0000 mg | Freq: Once | INTRAVENOUS | Status: AC
  Administered 2016-01-16: 1000 mg via INTRAVENOUS
  Filled 2016-01-16: qty 40

## 2016-01-16 MED ORDER — TRAMADOL HCL 50 MG PO TABS: 50.0000 mg | ORAL_TABLET | Freq: Four times a day (QID) | ORAL | Status: DC | PRN

## 2016-01-16 MED ORDER — HEPARIN SOD (PORK) LOCK FLUSH 100 UNIT/ML IV SOLN
500.0000 [IU] | Freq: Once | INTRAVENOUS | Status: AC | PRN
  Administered 2016-01-16: 500 [IU]
  Filled 2016-01-16: qty 5

## 2016-01-16 MED ORDER — IBUPROFEN 200 MG PO TABS
800.0000 mg | ORAL_TABLET | Freq: Once | ORAL | Status: AC
  Administered 2016-01-16: 800 mg via ORAL
  Filled 2016-01-16: qty 4

## 2016-01-16 MED ORDER — SODIUM CHLORIDE 0.9 % IV SOLN
75.0000 mg/m2 | Freq: Once | INTRAVENOUS | Status: AC
  Administered 2016-01-16: 153 mg via INTRAVENOUS
  Filled 2016-01-16: qty 103

## 2016-01-16 MED ORDER — SODIUM CHLORIDE 0.9 % IV SOLN
Freq: Once | INTRAVENOUS | Status: AC
  Administered 2016-01-16: 13:00:00 via INTRAVENOUS
  Filled 2016-01-16: qty 5

## 2016-01-16 MED ORDER — CYANOCOBALAMIN 1000 MCG/ML IJ SOLN
1000.0000 ug | Freq: Once | INTRAMUSCULAR | Status: AC
  Administered 2016-01-16: 1000 ug via INTRAMUSCULAR
  Filled 2016-01-16: qty 1

## 2016-01-16 MED ORDER — PALONOSETRON HCL INJECTION 0.25 MG/5ML
0.2500 mg | Freq: Once | INTRAVENOUS | Status: AC
  Administered 2016-01-16: 0.25 mg via INTRAVENOUS
  Filled 2016-01-16: qty 5

## 2016-01-16 MED ORDER — POTASSIUM CHLORIDE 2 MEQ/ML IV SOLN
Freq: Once | INTRAVENOUS | Status: AC
  Administered 2016-01-16: 11:00:00 via INTRAVENOUS
  Filled 2016-01-16: qty 1000

## 2016-01-16 MED ORDER — SODIUM CHLORIDE 0.9 % IV SOLN
Freq: Once | INTRAVENOUS | Status: AC
  Administered 2016-01-16: 10:00:00 via INTRAVENOUS
  Filled 2016-01-16: qty 1000

## 2016-01-16 MED ORDER — TRAMADOL HCL 50 MG PO TABS
50.0000 mg | ORAL_TABLET | Freq: Once | ORAL | Status: AC
  Administered 2016-01-16: 50 mg via ORAL
  Filled 2016-01-16: qty 1

## 2016-01-16 NOTE — Progress Notes (Signed)
Patient is having a hard time with his chemo regimen.  He is having back pain worse on right side shoulder blade that is progressively getting worse and only taking Tylenol with no relief.  The pain was so bad last night was even debating about going to ER for pain relief.  Still having constipation and needing the Lactulose and had a syncope episode after last episode.  Not sleeping well at night.  Has no taste so it is starting to affect his appetite.

## 2016-01-20 NOTE — Progress Notes (Signed)
Christopher Huber  Telephone:(336) [phone] Fax:(336) [phone]  ID: Christopher Huber OB: 04-Sep-1946  MR#: [id number]  TOI#:[id number]  Patient Care Team: Venia Carbon, MD as PCP - General (Internal Medicine)  CHIEF COMPLAINT:  Chief Complaint  Patient presents with  . Lung Cancer    INTERVAL HISTORY: Patient returns to clinic today for consideration of cycle 4 of cisplatin and pemetrexed.  Patient's stage treatment is becoming more difficult  With worsening back and right shoulder pain. He continues to have problems with constipation as well. He has difficulty sleeping. He continues to have a chronic cough. He has no neurologic complaints. He denies any fevers. He has no chest pain, shortness of breath, or hemoptysis.  He has no vomiting or diarrhea.  Patient offers no further specific complaints today.   REVIEW OF SYSTEMS:   Review of Systems  Constitutional: Positive for malaise/fatigue. Negative for fever.  Respiratory: Positive for cough. Negative for hemoptysis, sputum production and shortness of breath.   Cardiovascular: Negative.  Negative for chest pain.  Gastrointestinal: Positive for nausea and constipation. Negative for vomiting.  Musculoskeletal: Positive for back pain and joint pain.  Skin: Negative for rash.  Neurological: Positive for weakness.    As per HPI. Otherwise, a complete review of systems is negatve.  PAST MEDICAL HISTORY: Past Medical History  Diagnosis Date  . Lung cancer ([place])   . Arthritis     right arm  . GERD (gastroesophageal reflux disease)     PAST SURGICAL HISTORY: Past Surgical History  Procedure Laterality Date  . Colonoscopy    . Esophagogastroduodenoscopy    . Left shoulder reconstruction  ~1965    after hunting accident    FAMILY HISTORY Family History  Problem Relation Age of Onset  . Heart disease Father   . Diabetes Neg Hx   . Cancer Neg Hx        ADVANCED DIRECTIVES:    HEALTH MAINTENANCE: Social  History  Substance Use Topics  . Smoking status: Former Smoker    Quit date: 06/27/1997  . Smokeless tobacco: Never Used  . Alcohol Use: No     Colonoscopy:  PAP:  Bone density:  Lipid panel:  Allergies  Allergen Reactions  . Codeine Nausea And Vomiting    Dizzy     Current Outpatient Prescriptions  Medication Sig Dispense Refill  . Ascorbic Acid (VITAMIN C) 1000 MG tablet Take 1,000 mg by mouth daily.    . benzonatate (TESSALON) 100 MG capsule Take 1 capsule (100 mg total) by mouth 3 (three) times daily as needed for cough. 30 capsule 0  . Cholecalciferol (VITAMIN D) 2000 UNITS CAPS Take 1 capsule by mouth daily.    . folic acid (FOLVITE) 1 MG tablet Take 1 tablet (1 mg total) by mouth daily. Start 5-7 days before Alimta chemotherapy. Continue until 21 days after Alimta completed. 100 tablet 3  . lactulose (CHRONULAC) 10 GM/15ML solution Take 45 mLs (30 g total) by mouth every 6 (six) hours. 946 mL 1  . lidocaine-prilocaine (EMLA) cream Apply to affected area once 30 g 3  . magnesium oxide (MAG-OX) 400 MG tablet Take 400 mg by mouth daily.    . Multiple Vitamin (MULTIVITAMIN WITH MINERALS) TABS Take 1 tablet by mouth daily.    . ondansetron (ZOFRAN) 8 MG tablet Take 1 tablet (8 mg total) by mouth 2 (two) times daily as needed (Nausea or vomiting). Start if needed on the third day after chemotherapy. 30 tablet 1  .  prochlorperazine (COMPAZINE) 10 MG tablet Take 1 tablet (10 mg total) by mouth every 6 (six) hours as needed (Nausea or vomiting). 30 tablet 1  . ranitidine (ZANTAC) 150 MG tablet TAKE 1 TABLET BY MOUTH TWICE A DAY 60 tablet 11  . triamcinolone cream (KENALOG) 0.1 % Apply 1 application topically 2 (two) times daily. 80 g 1  . vitamin E 400 UNIT capsule Take 400 Units by mouth daily.    Marland Kitchen HYDROcodone-acetaminophen (NORCO/VICODIN) 5-325 MG tablet Reported on 01/16/2016  0  . traMADol (ULTRAM) 50 MG tablet Take 1 tablet (50 mg total) by mouth every 6 (six) hours as needed.  30 tablet 0   No current facility-administered medications for this visit.    OBJECTIVE: Filed Vitals:   01/16/16 0941  BP: 114/82  Pulse: 98  Resp: 82     Body mass index is 28.05 kg/(m^2).    ECOG FS:1 - Symptomatic but completely ambulatory  General: Well-developed, well-nourished, no acute distress. Eyes: anicteric sclera. Lungs: Clear to auscultation bilaterally. Heart: Regular rate and rhythm. No rubs, murmurs, or gallops. Abdomen: Soft, nontender, nondistended. No organomegaly noted, normoactive bowel sounds. Musculoskeletal: No edema, cyanosis, or clubbing. Neuro: Alert, answering all questions appropriately. Cranial nerves grossly intact. Skin: No rashes or petechiae noted. Psych: Normal affect.  LAB RESULTS:  Lab Results  Component Value Date   NA 132* 01/16/2016   K 4.4 01/16/2016   CL 100* 01/16/2016   CO2 26 01/16/2016   GLUCOSE 95 01/16/2016   BUN 12 01/16/2016   CREATININE 0.78 01/16/2016   CALCIUM 9.0 01/16/2016   PROT 6.6 01/16/2016   ALBUMIN 3.9 01/16/2016   AST 14* 01/16/2016   ALT 13* 01/16/2016   ALKPHOS 83 01/16/2016   BILITOT 0.5 01/16/2016   GFRNONAA >60 01/16/2016   GFRAA >60 01/16/2016    Lab Results  Component Value Date   WBC 2.8* 01/16/2016   NEUTROABS 1.8 01/16/2016   HGB 11.8* 01/16/2016   HCT 34.1* 01/16/2016   MCV 86.6 01/16/2016   PLT 286 01/16/2016     STUDIES: No results found.  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  PLAN:    1.  Lung cancer:  PET scan results reviewed independently with likely progression of disease. Proceed with cycle 4 of cisplatin and pemetrexed today. Return to clinic in 3 weeks for laboratory work and consideration of cycle 5. Will reimage with PET scan prior to next treatment and consider maintenance pemetrexed if improved.  Patient also may benefit from additional XRT at the conclusion of his chemotherapy. 2.  Rash:  Resolved.   Secondary to Tarceva which has been discontinued.  3.  Hypomagnesemia: Resolved. 4.  Cough: Patient states that OTC medications and Tessalon Perles do not work. He has an allergy to codeine. 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 Consider prednisone in the future. 5.  Nausea: Continue Zofran and Compazine as needed. 6.  Constipation: Continue stool softeners and Miralax.  Lactulose as needed.  7.  Pain: Tramadol did not help and patient cannot take narcotics given side effects. Monitor.  Patient expressed understanding and was in agreement with this plan. He also understands that He can call clinic at any time with any questions, concerns, or complaints.    Lloyd Huger, MD   01/20/2016 8:10 AM

## 2016-01-24 ENCOUNTER — Telehealth: Payer: Self-pay | Admitting: Internal Medicine

## 2016-01-24 NOTE — Telephone Encounter (Signed)
Not sure if this is related to the chemo, etc Will assess at OV--unless he goes to oncology or the ER before

## 2016-01-24 NOTE — Telephone Encounter (Signed)
Spoke with patient and NP and pt did not want to come in today, scheduled appt tomorrow 01/25/2016 @ 10:15

## 2016-01-24 NOTE — Telephone Encounter (Signed)
Christopher Huber called she is with pt know  She wanted to let you know Pt has right flank pain And cva tenderness    Please call wendy [phone] Pt know [phone]

## 2016-01-25 ENCOUNTER — Encounter: Payer: Self-pay | Admitting: Internal Medicine

## 2016-01-25 ENCOUNTER — Ambulatory Visit (INDEPENDENT_AMBULATORY_CARE_PROVIDER_SITE_OTHER): Payer: Medicare Other | Admitting: Internal Medicine

## 2016-01-25 ENCOUNTER — Ambulatory Visit (INDEPENDENT_AMBULATORY_CARE_PROVIDER_SITE_OTHER)
Admission: RE | Admit: 2016-01-25 | Discharge: 2016-01-25 | Disposition: A | Discharge status: Home / Self Care | Payer: Medicare Other | Source: Ambulatory Visit | Attending: Internal Medicine | Admitting: Internal Medicine

## 2016-01-25 VITALS — BP 120/70 | HR 70 | Temp 97.5°F | Wt 191.0 lb

## 2016-01-25 DIAGNOSIS — M546 Pain in thoracic spine: Secondary | ICD-10-CM

## 2016-01-25 DIAGNOSIS — C3490 Malignant neoplasm of unspecified part of unspecified bronchus or lung: Secondary | ICD-10-CM | POA: Diagnosis not present

## 2016-01-25 DIAGNOSIS — R918 Other nonspecific abnormal finding of lung field: Secondary | ICD-10-CM | POA: Diagnosis not present

## 2016-01-25 NOTE — Patient Instructions (Signed)
Please try the ibuprofen '400mg'$  up to three times a day for the pain.

## 2016-01-25 NOTE — Progress Notes (Signed)
Subjective:    Patient ID: Christopher Huber, male    DOB: Apr 13, 1946, 70 y.o.   MRN: [id number]  HPI Here due to right upper back pain  Pain under right shoulder blade Had bad cough--Dr Grayland Ormond thought it might be from a bad cough he had Cough is now better Pain was the worst last night Did have pleuritic component when he was coughing Heating pad helped Ibuprofen and tramadol have helped the pain (but some wooziness from tramadol so stopped) No fever Rare cough now-- dry  No SOB  Chemo treatments every 3 weeks ago  Current Outpatient Prescriptions on File Prior to Visit  Medication Sig Dispense Refill  . Ascorbic Acid (VITAMIN C) 1000 MG tablet Take 1,000 mg by mouth daily.    . Cholecalciferol (VITAMIN D) 2000 UNITS CAPS Take 1 capsule by mouth daily.    . folic acid (FOLVITE) 1 MG tablet Take 1 tablet (1 mg total) by mouth daily. Start 5-7 days before Alimta chemotherapy. Continue until 21 days after Alimta completed. 100 tablet 3  . lidocaine-prilocaine (EMLA) cream Apply to affected area once 30 g 3  . magnesium oxide (MAG-OX) 400 MG tablet Take 400 mg by mouth daily.    . Multiple Vitamin (MULTIVITAMIN WITH MINERALS) TABS Take 1 tablet by mouth daily.    . ondansetron (ZOFRAN) 8 MG tablet Take 1 tablet (8 mg total) by mouth 2 (two) times daily as needed (Nausea or vomiting). Start if needed on the third day after chemotherapy. 30 tablet 1  . prochlorperazine (COMPAZINE) 10 MG tablet Take 1 tablet (10 mg total) by mouth every 6 (six) hours as needed (Nausea or vomiting). 30 tablet 1  . ranitidine (ZANTAC) 150 MG tablet TAKE 1 TABLET BY MOUTH TWICE A DAY 60 tablet 11  . triamcinolone cream (KENALOG) 0.1 % Apply 1 application topically 2 (two) times daily. 80 g 1  . vitamin E 400 UNIT capsule Take 400 Units by mouth daily.     No current facility-administered medications on file prior to visit.    Allergies  Allergen Reactions  . Codeine Nausea And Vomiting    Dizzy      Past Medical History  Diagnosis Date  . Lung cancer ([place])   . Arthritis     right arm  . GERD (gastroesophageal reflux disease)     Past Surgical History  Procedure Laterality Date  . Colonoscopy    . Esophagogastroduodenoscopy    . Left shoulder reconstruction  ~1965    after hunting accident    Family History  Problem Relation Age of Onset  . Heart disease Father   . Diabetes Neg Hx   . Cancer Neg Hx     Social History   Social History  . Marital Status: Married    Spouse Name: N/A  . Number of Children: 2  . Years of Education: N/A   Occupational History  . 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 Smoking status: Former Smoker    Quit date: 06/27/1997  . Smokeless tobacco: Never Used  . Alcohol Use: No  . Drug Use: No  . Sexual Activity: Not on file   Other Topics Concern  . Not on file   Social History Narrative   2 daughters      Has living will   Wife is health care POA   Would accept resuscitation at this point   Would consider tube  feedings if prognosis is unclear   Review of Systems No dysuria, urinary frequency, hematuria Appetite is down due to chemo Hasn't lost weight    Objective:   Physical Exam  Constitutional: He appears well-developed and well-nourished. No distress.  Neck: Normal range of motion. Neck supple. No thyromegaly present.  No supraclavicular nodes  Cardiovascular: Normal rate, regular rhythm and normal heart sounds.  Exam reveals no gallop.   No murmur heard. Pulmonary/Chest: Effort normal. No respiratory distress. He has no wheezes. He has no rales.  No scapular or rib tenderness No dullness Very slight expiratory noise--but no set wheezing  Lymphadenopathy:    He has no cervical adenopathy.          Assessment & Plan:

## 2016-01-25 NOTE — Assessment & Plan Note (Addendum)
Pain area is ~T9 on posterior axillary line and slightly medial No tenderness to suggest bony lesion Too high for kidney stone  CXR is reassuring--- had radiologist overread already May be muscular--will use ibuprofen prn

## 2016-01-25 NOTE — Assessment & Plan Note (Signed)
Has been on new chemo No clear mass on the CXR---? Chemo helping Will really need the PET scan to know for sure Fortunately, no apparent bony lesions seen on the CXR

## 2016-01-25 NOTE — Progress Notes (Signed)
Pre visit review using our clinic review tool, if applicable. No additional management support is needed unless otherwise documented below in the visit note. 

## 2016-01-29 ENCOUNTER — Other Ambulatory Visit: Payer: Self-pay | Admitting: Oncology

## 2016-01-30 ENCOUNTER — Other Ambulatory Visit: Payer: Self-pay | Admitting: *Deleted

## 2016-01-30 DIAGNOSIS — C3490 Malignant neoplasm of unspecified part of unspecified bronchus or lung: Secondary | ICD-10-CM

## 2016-02-04 ENCOUNTER — Ambulatory Visit
Admission: RE | Admit: 2016-02-04 | Discharge: 2016-02-04 | Disposition: A | Discharge status: Home / Self Care | Payer: Medicare Other | Source: Ambulatory Visit | Attending: Radiation Oncology | Admitting: Radiation Oncology

## 2016-02-04 DIAGNOSIS — C3412 Malignant neoplasm of upper lobe, left bronchus or lung: Secondary | ICD-10-CM | POA: Diagnosis not present

## 2016-02-04 DIAGNOSIS — R590 Localized enlarged lymph nodes: Secondary | ICD-10-CM | POA: Diagnosis not present

## 2016-02-04 DIAGNOSIS — R911 Solitary pulmonary nodule: Secondary | ICD-10-CM | POA: Insufficient documentation

## 2016-02-04 DIAGNOSIS — C3490 Malignant neoplasm of unspecified part of unspecified bronchus or lung: Secondary | ICD-10-CM | POA: Diagnosis not present

## 2016-02-04 LAB — GLUCOSE, CAPILLARY: GLUCOSE-CAPILLARY: 84 mg/dL (ref 65–99)

## 2016-02-04 MED ORDER — FLUDEOXYGLUCOSE F - 18 (FDG) INJECTION: 11.8600 | Freq: Once | INTRAVENOUS | Status: DC | PRN

## 2016-02-06 ENCOUNTER — Inpatient Hospital Stay (HOSPITAL_BASED_OUTPATIENT_CLINIC_OR_DEPARTMENT_OTHER): Payer: Medicare Other | Admitting: Oncology

## 2016-02-06 ENCOUNTER — Inpatient Hospital Stay: Payer: Medicare Other

## 2016-02-06 ENCOUNTER — Inpatient Hospital Stay: Payer: Medicare Other | Attending: Oncology

## 2016-02-06 VITALS — BP 147/86 | HR 76 | Temp 97.3°F | Resp 18 | Wt 193.6 lb

## 2016-02-06 DIAGNOSIS — M549 Dorsalgia, unspecified: Secondary | ICD-10-CM | POA: Diagnosis not present

## 2016-02-06 DIAGNOSIS — R110 Nausea: Secondary | ICD-10-CM

## 2016-02-06 DIAGNOSIS — R5381 Other malaise: Secondary | ICD-10-CM | POA: Diagnosis not present

## 2016-02-06 DIAGNOSIS — Z9221 Personal history of antineoplastic chemotherapy: Secondary | ICD-10-CM | POA: Diagnosis not present

## 2016-02-06 DIAGNOSIS — Z79899 Other long term (current) drug therapy: Secondary | ICD-10-CM | POA: Insufficient documentation

## 2016-02-06 DIAGNOSIS — G4700 Insomnia, unspecified: Secondary | ICD-10-CM | POA: Diagnosis not present

## 2016-02-06 DIAGNOSIS — C3411 Malignant neoplasm of upper lobe, right bronchus or lung: Secondary | ICD-10-CM | POA: Insufficient documentation

## 2016-02-06 DIAGNOSIS — Z87891 Personal history of nicotine dependence: Secondary | ICD-10-CM | POA: Diagnosis not present

## 2016-02-06 DIAGNOSIS — R05 Cough: Secondary | ICD-10-CM

## 2016-02-06 DIAGNOSIS — Z452 Encounter for adjustment and management of vascular access device: Secondary | ICD-10-CM | POA: Diagnosis not present

## 2016-02-06 DIAGNOSIS — M25511 Pain in right shoulder: Secondary | ICD-10-CM | POA: Diagnosis not present

## 2016-02-06 DIAGNOSIS — R21 Rash and other nonspecific skin eruption: Secondary | ICD-10-CM | POA: Diagnosis not present

## 2016-02-06 DIAGNOSIS — K219 Gastro-esophageal reflux disease without esophagitis: Secondary | ICD-10-CM | POA: Insufficient documentation

## 2016-02-06 DIAGNOSIS — M1990 Unspecified osteoarthritis, unspecified site: Secondary | ICD-10-CM | POA: Diagnosis not present

## 2016-02-06 DIAGNOSIS — R531 Weakness: Secondary | ICD-10-CM | POA: Insufficient documentation

## 2016-02-06 DIAGNOSIS — C3490 Malignant neoplasm of unspecified part of unspecified bronchus or lung: Secondary | ICD-10-CM

## 2016-02-06 LAB — COMPREHENSIVE METABOLIC PANEL
ALBUMIN: 3.9 g/dL (ref 3.5–5.0)
ALT: 11 U/L — ABNORMAL LOW (ref 17–63)
AST: 16 U/L (ref 15–41)
Alkaline Phosphatase: 69 U/L (ref 38–126)
Anion gap: 6 (ref 5–15)
BILIRUBIN TOTAL: 0.4 mg/dL (ref 0.3–1.2)
BUN: 15 mg/dL (ref 6–20)
CO2: 26 mmol/L (ref 22–32)
Calcium: 9 mg/dL (ref 8.9–10.3)
Chloride: 100 mmol/L — ABNORMAL LOW (ref 101–111)
Creatinine, Ser: 0.96 mg/dL (ref 0.61–1.24)
GFR calc Af Amer: >60 mL/min (ref >60)
GFR calc non Af Amer: >60 mL/min (ref >60)
GLUCOSE: 87 mg/dL (ref 65–99)
POTASSIUM: 4.4 mmol/L (ref 3.5–5.1)
Sodium: 132 mmol/L — ABNORMAL LOW (ref 135–145)
TOTAL PROTEIN: 7 g/dL (ref 6.5–8.1)

## 2016-02-06 LAB — CBC WITH DIFFERENTIAL/PLATELET
BASOS ABS: 0 10*3/uL (ref 0–0.1)
BASOS PCT: 1 %
Eosinophils Absolute: 0 10*3/uL (ref 0–0.7)
Eosinophils Relative: 2 %
HEMATOCRIT: 32.2 % — AB (ref 40.0–52.0)
HEMOGLOBIN: 11.3 g/dL — AB (ref 13.0–18.0)
Lymphocytes Relative: 15 %
Lymphs Abs: 0.4 10*3/uL — ABNORMAL LOW (ref 1.0–3.6)
MCH: 30.6 pg (ref 26.0–34.0)
MCHC: 35.2 g/dL (ref 32.0–36.0)
MCV: 86.9 fL (ref 80.0–100.0)
Monocytes Absolute: 0.6 10*3/uL (ref 0.2–1.0)
Monocytes Relative: 24 %
NEUTROS ABS: 1.5 10*3/uL (ref 1.4–6.5)
NEUTROS PCT: 60 %
Platelets: 275 10*3/uL (ref 150–440)
RBC: 3.7 MIL/uL — ABNORMAL LOW (ref 4.40–5.90)
RDW: 16.8 % — ABNORMAL HIGH (ref 11.5–14.5)
WBC: 2.5 10*3/uL — ABNORMAL LOW (ref 3.8–10.6)

## 2016-02-06 NOTE — Progress Notes (Signed)
Today is the best patient has felt in the past month.  After his treatments he would have severe rash that looks like a severe burn.

## 2016-02-07 ENCOUNTER — Other Ambulatory Visit: Payer: Self-pay | Admitting: *Deleted

## 2016-02-08 ENCOUNTER — Other Ambulatory Visit: Payer: Self-pay | Admitting: *Deleted

## 2016-02-08 DIAGNOSIS — C3490 Malignant neoplasm of unspecified part of unspecified bronchus or lung: Secondary | ICD-10-CM

## 2016-02-15 LAB — MISC LABCORP TEST (SEND OUT): Labcorp test code: 489607

## 2016-02-17 NOTE — Progress Notes (Signed)
[place]  [phone] [phone]) [phone]  ID: Christopher Huber OB: May 01, 1946  MR#: [id number]  QIO#:[id number]  Patient Care Team: Venia Carbon, MD as PCP - General (Internal Medicine)  CHIEF COMPLAINT:  Chief Complaint  Patient presents with  . Lung Cancer    INTERVAL HISTORY: Patient returns to clinic today for further evaluation and discussion of his imaging results. Patient would have a significant rash which are borderline April return particulars torso after each treatment, which would then resolved in 3-4 days. He currently feels well, "the best she has been in a while."  He continues to have right shoulder pain. He does not complain of constipation today. He has difficulty sleeping. He continues to have a chronic cough. He has no neurologic complaints. He denies any fevers. He has no chest pain, shortness of breath, or hemoptysis.  He has no vomiting or diarrhea.  Patient offers no further specific complaints today.   REVIEW OF SYSTEMS:   Review of Systems  Constitutional: Positive for malaise/fatigue. Negative for fever.  Respiratory: Positive for cough. Negative for hemoptysis, sputum production and shortness of breath.   Cardiovascular: Negative.  Negative for chest pain.  Gastrointestinal: Positive for nausea. Negative for vomiting and constipation.  Musculoskeletal: Positive for back pain and joint pain.  Skin: Positive for rash.  Neurological: Positive for weakness.    As per HPI. Otherwise, a complete review of systems is negatve.  PAST MEDICAL HISTORY: Past Medical History  Diagnosis Date  . Lung cancer ([place])   . Arthritis     right arm  . GERD (gastroesophageal reflux disease)     PAST SURGICAL HISTORY: Past Surgical History  Procedure Laterality Date  . Colonoscopy    . Esophagogastroduodenoscopy    . Left shoulder reconstruction  ~1965    after hunting accident    FAMILY HISTORY Family History  Problem Relation  Age of Onset  . Heart disease Father   . Diabetes Neg Hx   . Cancer Neg Hx        ADVANCED DIRECTIVES:    HEALTH MAINTENANCE: Social History  Substance Use Topics  . Smoking status: Former Smoker    Quit date: 06/27/1997  . Smokeless tobacco: Never Used  . Alcohol Use: No     Colonoscopy:  PAP:  Bone density:  Lipid panel:  Allergies  Allergen Reactions  . Pemetrexed Rash  . Codeine Nausea And Vomiting    Dizzy     Current Outpatient Prescriptions  Medication Sig Dispense Refill  . Ascorbic Acid (VITAMIN C) 1000 MG tablet Take 1,000 mg by mouth daily.    . Cholecalciferol (VITAMIN D) 2000 UNITS CAPS Take 1 capsule by mouth daily.    . folic acid (FOLVITE) 1 MG tablet Take 1 tablet (1 mg total) by mouth daily. Start 5-7 days before Alimta chemotherapy. Continue until 21 days after Alimta completed. 100 tablet 3  . GENERLAC 10 GM/15ML SOLN TK 45 ML PO Q 6 H  1  . lidocaine-prilocaine (EMLA) cream Apply to affected area once 30 g 3  . magnesium oxide (MAG-OX) 400 MG tablet Take 400 mg by mouth daily.    . Multiple Vitamin (MULTIVITAMIN WITH MINERALS) TABS Take 1 tablet by mouth daily.    . ondansetron (ZOFRAN) 8 MG tablet Take 1 tablet (8 mg total) by mouth 2 (two) times daily as needed (Nausea or vomiting). Start if needed on the third day after chemotherapy. 30 tablet 1  . prochlorperazine (COMPAZINE) 10  MG tablet Take 1 tablet (10 mg total) by mouth every 6 (six) hours as needed (Nausea or vomiting). 30 tablet 1  . ranitidine (ZANTAC) 150 MG tablet TAKE 1 TABLET BY MOUTH TWICE A DAY 60 tablet 11  . triamcinolone cream (KENALOG) 0.1 % Apply 1 application topically 2 (two) times daily. 80 g 1  . vitamin E 400 UNIT capsule Take 400 Units by mouth daily.     No current facility-administered medications for this visit.    OBJECTIVE: Filed Vitals:   02/06/16 1540  BP: 147/86  Pulse: 76  Temp: 97.3 F (36.3 C)  Resp: 18     Body mass index is 28.57 kg/(m^2).    ECOG  FS:1 - Symptomatic but completely ambulatory  General: Well-developed, well-nourished, no acute distress. Eyes: anicteric sclera. Lungs: Clear to auscultation bilaterally. Heart: Regular rate and rhythm. No rubs, murmurs, or gallops. Abdomen: Soft, nontender, nondistended. No organomegaly noted, normoactive bowel sounds. Musculoskeletal: No edema, cyanosis, or clubbing. Neuro: Alert, answering all questions appropriately. Cranial nerves grossly intact. Skin: Rash has resolved. Psych: Normal affect.  LAB RESULTS:  Lab Results  Component Value Date   NA 132* 02/06/2016   K 4.4 02/06/2016   CL 100* 02/06/2016   CO2 26 02/06/2016   GLUCOSE 87 02/06/2016   BUN 15 02/06/2016   CREATININE 0.96 02/06/2016   CALCIUM 9.0 02/06/2016   PROT 7.0 02/06/2016   ALBUMIN 3.9 02/06/2016   AST 16 02/06/2016   ALT 11* 02/06/2016   ALKPHOS 69 02/06/2016   BILITOT 0.4 02/06/2016   GFRNONAA >60 02/06/2016   GFRAA >60 02/06/2016    Lab Results  Component Value Date   WBC 2.5* 02/06/2016   NEUTROABS 1.5 02/06/2016   HGB 11.3* 02/06/2016   HCT 32.2* 02/06/2016   MCV 86.9 02/06/2016   PLT 275 02/06/2016     STUDIES: Dg Chest 2 View  01/25/2016  CLINICAL DATA:  Right side back pain.  History of lung cancer EXAM: CHEST  2 VIEW COMPARISON:  09/23/2015 chest x-ray.  Chest CT 11/05/2015 FINDINGS: Right side Port-A-Cath remains in place, unchanged. Right apical pleural/ parenchymal densities are again noted, likely scarring. Left upper lobe linear scarring noted. No definite acute airspace opacities or effusions. Heart is normal size. No acute bony abnormality. IMPRESSION: Stable right apical pleural/ parenchymal densities, likely scarring. No active disease. Electronically Signed   By: Rolm Baptise M.D.   On: 01/25/2016 11:09   Nm Pet Image Restag (ps) Skull Base To Thigh  02/04/2016  CLINICAL DATA:  Subsequent treatment strategy for Lung cancer. EXAM: NUCLEAR MEDICINE PET SKULL BASE TO THIGH  TECHNIQUE: 11.86 mCi F-18 FDG was injected intravenously. Full-ring PET imaging was performed from the skull base to thigh after the radiotracer. CT data was obtained and used for attenuation correction and anatomic localization. FASTING BLOOD GLUCOSE:  Value: 84 mg/dl COMPARISON:  11/05/2015 FINDINGS: NECK No hypermetabolic lymph nodes in the neck. CHEST The index right hilar lymph node measures 1.7 cm and has an SUV max equal to 5.93. Previously this measured 2 cm and had an SUV max equal to 9.8. The index nodule within the right upper lobe measures 9 mm and has an SUV max equal to 3.6. On the previous exam this measured 2.1 cm and had an SUV max equal to 12.8. No new or progressive disease identified. Stable masslike density within the right apex measuring 2.2 cm with an SUV max equal to 2.79. Previously 2 cm with an SUV max equal  to 3.2. ABDOMEN/PELVIS No abnormal hypermetabolic activity within the liver, pancreas, adrenal glands, or spleen. No hypermetabolic lymph nodes in the abdomen or pelvis. SKELETON No focal hypermetabolic activity to suggest skeletal metastasis. IMPRESSION: 1. Interval response to therapy. 2. Decrease in size and FDG uptake associated with previous cavitary lesion in the right upper lobe. 3. Decrease in size and FDG uptake associated with right hilar adenopathy. Electronically Signed   By: Kerby Moors M.D.   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 T751mmutation was not detected.  PLAN:    1.  Lung cancer:  PET scan results from February 04, 2016 revealed interval response to therapy. After discussion with the patient given his improvement of symptoms and PET scan, he does not wish to pursue additional treatment at this time. Also, his significant rash was likely secondary to pemetrexed therefore would not pursue maintenance treatment. Patient expressed understanding that he will likely need  additional treatment in the future. Return to clinic in 3 months with repeat laboratory work, imaging, and further evaluation. We will also discussed with radiation oncology additional XRT would be beneficial. 2.  Rash:  Resolved.  Secondary to pemetrexed which has been discontinued.  3.  Hypomagnesemia: Resolved. 4.  Cough: Patient states that OTC medications and Tessalon Perles do not work. He has an allergy to codeine. 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 Consider prednisone in the future. 5.  Nausea: Continue Zofran and Compazine as needed. 6.  Constipation: Continue stool softeners and Miralax.  Lactulose as needed.  7.  Pain: Tramadol did not help and patient cannot take narcotics given side effects. Monitor.  Patient expressed understanding and was in agreement with this plan. He also understands that He can call clinic at any time with any questions, concerns, or complaints.    TLloyd Huger MD   02/17/2016 7:35 AM

## 2016-02-27 ENCOUNTER — Inpatient Hospital Stay: Payer: Medicare Other

## 2016-02-27 DIAGNOSIS — Z9221 Personal history of antineoplastic chemotherapy: Secondary | ICD-10-CM | POA: Diagnosis not present

## 2016-02-27 DIAGNOSIS — R5381 Other malaise: Secondary | ICD-10-CM | POA: Diagnosis not present

## 2016-02-27 DIAGNOSIS — M549 Dorsalgia, unspecified: Secondary | ICD-10-CM | POA: Diagnosis not present

## 2016-02-27 DIAGNOSIS — M1990 Unspecified osteoarthritis, unspecified site: Secondary | ICD-10-CM | POA: Diagnosis not present

## 2016-02-27 DIAGNOSIS — R21 Rash and other nonspecific skin eruption: Secondary | ICD-10-CM | POA: Diagnosis not present

## 2016-02-27 DIAGNOSIS — R110 Nausea: Secondary | ICD-10-CM | POA: Diagnosis not present

## 2016-02-27 DIAGNOSIS — Z95828 Presence of other vascular implants and grafts: Secondary | ICD-10-CM

## 2016-02-27 DIAGNOSIS — Z87891 Personal history of nicotine dependence: Secondary | ICD-10-CM | POA: Diagnosis not present

## 2016-02-27 DIAGNOSIS — R531 Weakness: Secondary | ICD-10-CM | POA: Diagnosis not present

## 2016-02-27 DIAGNOSIS — G4700 Insomnia, unspecified: Secondary | ICD-10-CM | POA: Diagnosis not present

## 2016-02-27 DIAGNOSIS — Z79899 Other long term (current) drug therapy: Secondary | ICD-10-CM | POA: Diagnosis not present

## 2016-02-27 DIAGNOSIS — Z452 Encounter for adjustment and management of vascular access device: Secondary | ICD-10-CM | POA: Diagnosis not present

## 2016-02-27 DIAGNOSIS — C3411 Malignant neoplasm of upper lobe, right bronchus or lung: Secondary | ICD-10-CM | POA: Diagnosis not present

## 2016-02-27 DIAGNOSIS — M25511 Pain in right shoulder: Secondary | ICD-10-CM | POA: Diagnosis not present

## 2016-02-27 DIAGNOSIS — R05 Cough: Secondary | ICD-10-CM | POA: Diagnosis not present

## 2016-02-27 DIAGNOSIS — K219 Gastro-esophageal reflux disease without esophagitis: Secondary | ICD-10-CM | POA: Diagnosis not present

## 2016-02-27 MED ORDER — SODIUM CHLORIDE 0.9% FLUSH
10.0000 mL | INTRAVENOUS | Status: DC | PRN
  Administered 2016-02-27: 10 mL via INTRAVENOUS
  Filled 2016-02-27: qty 10

## 2016-02-27 MED ORDER — HEPARIN SOD (PORK) LOCK FLUSH 100 UNIT/ML IV SOLN
500.0000 [IU] | Freq: Once | INTRAVENOUS | Status: AC
  Administered 2016-02-27: 500 [IU] via INTRAVENOUS

## 2016-03-05 ENCOUNTER — Encounter: Payer: Self-pay | Admitting: Radiation Oncology

## 2016-03-05 ENCOUNTER — Ambulatory Visit
Admission: RE | Admit: 2016-03-05 | Discharge: 2016-03-05 | Disposition: A | Discharge status: Home / Self Care | Payer: Medicare Other | Source: Ambulatory Visit | Attending: Radiation Oncology | Admitting: Radiation Oncology

## 2016-03-05 VITALS — BP 123/71 | HR 82 | Temp 97.5°F | Resp 20 | Wt 192.5 lb

## 2016-03-05 DIAGNOSIS — Z510 Encounter for antineoplastic radiation therapy: Secondary | ICD-10-CM | POA: Insufficient documentation

## 2016-03-05 DIAGNOSIS — C3490 Malignant neoplasm of unspecified part of unspecified bronchus or lung: Secondary | ICD-10-CM

## 2016-03-05 DIAGNOSIS — C3412 Malignant neoplasm of upper lobe, left bronchus or lung: Secondary | ICD-10-CM | POA: Diagnosis not present

## 2016-03-05 DIAGNOSIS — Z87891 Personal history of nicotine dependence: Secondary | ICD-10-CM | POA: Diagnosis not present

## 2016-03-05 DIAGNOSIS — C3411 Malignant neoplasm of upper lobe, right bronchus or lung: Secondary | ICD-10-CM | POA: Insufficient documentation

## 2016-03-05 DIAGNOSIS — C7801 Secondary malignant neoplasm of right lung: Secondary | ICD-10-CM | POA: Diagnosis not present

## 2016-03-05 NOTE — Progress Notes (Signed)
Radiation Oncology Follow up Note  Name: Christopher Huber   Date:   03/05/2016 MRN:  [id number] DOB: 02-08-1946    This 70 y.o. male presents to the clinic today for follow-up for recurrent lung cancer.  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 Back in November 2016 there was a 2.1 cm hypermetabolic right central lobe nodule central cavitation and hypermetabolic activity in the right hilar region consistent with metastatic disease. He was treated with salvage chemotherapy with cis-platinum and pemetrexed. He's had a repeat PET CT scan which shows excellent response to therapy although there are still some hypermetabolic activity noted in that area of nodule as well as right hilar region. We discussed and he is seen today for consideration of further radiation therapy to this area. He is otherwise doing well. He specifically denies cough hemoptysis or chest tightness..  COMPLICATIONS OF TREATMENT: none  FOLLOW UP COMPLIANCE: keeps appointments   PHYSICAL EXAM:  BP 123/71 mmHg  Pulse 82  Temp(Src) 97.5 F (36.4 C)  Resp 20  Wt 192 lb 7.4 oz (87.3 kg) Well-developed well-nourished patient in NAD. HEENT reveals PERLA, EOMI, discs not visualized.  Oral cavity is clear. No oral mucosal lesions are identified. Neck is clear without evidence of cervical or supraclavicular adenopathy. Lungs are clear to A&P. Cardiac examination is essentially unremarkable with regular rate and rhythm without murmur rub or thrill. Abdomen is benign with no organomegaly or masses noted. Motor sensory and DTR levels are equal and  symmetric in the upper and lower extremities. Cranial nerves II through XII are grossly intact. Proprioception is intact. No peripheral adenopathy or edema is identified. No motor or sensory levels are noted. Crude visual fields are within normal range.  RADIOLOGY RESULTS: Serial CT scans and PET/CT scans are reviewed  PLAN: At the present time I to go ahead with another course of salvage radiation therapy. Would use a hypofractionated regiment of 5000 cGy in 10 fractions trying to avoid previous areas of irradiated lung using I am RT treatment planning and delivery. Risks and benefits of treatment including possible dysphasia from radiation esophagitis fatigue alteration of blood counts skin reaction and loss of normal lung volume all were discussed in detail with the patient. He seems to comprehend my treatment plan well has accepted treatment. I have set up and ordered CT simulation next week.  I would like to take this opportunity for allowing me to participate in the care of your patient.Armstead Peaks., MD

## 2016-03-10 ENCOUNTER — Ambulatory Visit
Admission: RE | Admit: 2016-03-10 | Discharge: 2016-03-10 | Disposition: A | Discharge status: Home / Self Care | Payer: Medicare Other | Source: Ambulatory Visit | Attending: Radiation Oncology | Admitting: Radiation Oncology

## 2016-03-10 DIAGNOSIS — C3411 Malignant neoplasm of upper lobe, right bronchus or lung: Secondary | ICD-10-CM | POA: Diagnosis not present

## 2016-03-10 DIAGNOSIS — Z510 Encounter for antineoplastic radiation therapy: Secondary | ICD-10-CM | POA: Diagnosis not present

## 2016-03-10 DIAGNOSIS — C7801 Secondary malignant neoplasm of right lung: Secondary | ICD-10-CM | POA: Diagnosis not present

## 2016-03-10 DIAGNOSIS — Z87891 Personal history of nicotine dependence: Secondary | ICD-10-CM | POA: Diagnosis not present

## 2016-03-14 ENCOUNTER — Other Ambulatory Visit: Payer: Self-pay | Admitting: *Deleted

## 2016-03-14 DIAGNOSIS — C3491 Malignant neoplasm of unspecified part of right bronchus or lung: Secondary | ICD-10-CM

## 2016-03-17 DIAGNOSIS — Z87891 Personal history of nicotine dependence: Secondary | ICD-10-CM | POA: Diagnosis not present

## 2016-03-17 DIAGNOSIS — C7801 Secondary malignant neoplasm of right lung: Secondary | ICD-10-CM | POA: Diagnosis not present

## 2016-03-17 DIAGNOSIS — C3411 Malignant neoplasm of upper lobe, right bronchus or lung: Secondary | ICD-10-CM | POA: Diagnosis not present

## 2016-03-17 DIAGNOSIS — Z510 Encounter for antineoplastic radiation therapy: Secondary | ICD-10-CM | POA: Diagnosis not present

## 2016-03-18 ENCOUNTER — Ambulatory Visit
Admission: RE | Admit: 2016-03-18 | Discharge: 2016-03-18 | Disposition: A | Discharge status: Home / Self Care | Payer: Medicare Other | Source: Ambulatory Visit | Attending: Radiation Oncology | Admitting: Radiation Oncology

## 2016-03-19 ENCOUNTER — Ambulatory Visit
Admission: RE | Admit: 2016-03-19 | Discharge: 2016-03-19 | Disposition: A | Discharge status: Home / Self Care | Payer: Medicare Other | Source: Ambulatory Visit | Attending: Radiation Oncology | Admitting: Radiation Oncology

## 2016-03-19 ENCOUNTER — Ambulatory Visit: Payer: Medicare Other

## 2016-03-19 DIAGNOSIS — C3411 Malignant neoplasm of upper lobe, right bronchus or lung: Secondary | ICD-10-CM | POA: Diagnosis not present

## 2016-03-19 DIAGNOSIS — Z510 Encounter for antineoplastic radiation therapy: Secondary | ICD-10-CM | POA: Diagnosis not present

## 2016-03-19 DIAGNOSIS — C7801 Secondary malignant neoplasm of right lung: Secondary | ICD-10-CM | POA: Diagnosis not present

## 2016-03-19 DIAGNOSIS — Z87891 Personal history of nicotine dependence: Secondary | ICD-10-CM | POA: Diagnosis not present

## 2016-03-19 DIAGNOSIS — C3412 Malignant neoplasm of upper lobe, left bronchus or lung: Secondary | ICD-10-CM | POA: Diagnosis not present

## 2016-03-20 ENCOUNTER — Ambulatory Visit
Admission: RE | Admit: 2016-03-20 | Discharge: 2016-03-20 | Disposition: A | Discharge status: Home / Self Care | Payer: Medicare Other | Source: Ambulatory Visit | Attending: Radiation Oncology | Admitting: Radiation Oncology

## 2016-03-20 DIAGNOSIS — C7801 Secondary malignant neoplasm of right lung: Secondary | ICD-10-CM | POA: Diagnosis not present

## 2016-03-20 DIAGNOSIS — Z510 Encounter for antineoplastic radiation therapy: Secondary | ICD-10-CM | POA: Diagnosis not present

## 2016-03-20 DIAGNOSIS — C3411 Malignant neoplasm of upper lobe, right bronchus or lung: Secondary | ICD-10-CM | POA: Diagnosis not present

## 2016-03-20 DIAGNOSIS — Z87891 Personal history of nicotine dependence: Secondary | ICD-10-CM | POA: Diagnosis not present

## 2016-03-21 ENCOUNTER — Ambulatory Visit
Admission: RE | Admit: 2016-03-21 | Discharge: 2016-03-21 | Disposition: A | Discharge status: Home / Self Care | Payer: Medicare Other | Source: Ambulatory Visit | Attending: Radiation Oncology | Admitting: Radiation Oncology

## 2016-03-21 DIAGNOSIS — Z510 Encounter for antineoplastic radiation therapy: Secondary | ICD-10-CM | POA: Diagnosis not present

## 2016-03-21 DIAGNOSIS — Z87891 Personal history of nicotine dependence: Secondary | ICD-10-CM | POA: Diagnosis not present

## 2016-03-21 DIAGNOSIS — C7801 Secondary malignant neoplasm of right lung: Secondary | ICD-10-CM | POA: Diagnosis not present

## 2016-03-21 DIAGNOSIS — C3411 Malignant neoplasm of upper lobe, right bronchus or lung: Secondary | ICD-10-CM | POA: Diagnosis not present

## 2016-03-24 ENCOUNTER — Ambulatory Visit
Admission: RE | Admit: 2016-03-24 | Discharge: 2016-03-24 | Disposition: A | Discharge status: Home / Self Care | Payer: Medicare Other | Source: Ambulatory Visit | Attending: Radiation Oncology | Admitting: Radiation Oncology

## 2016-03-24 DIAGNOSIS — C3411 Malignant neoplasm of upper lobe, right bronchus or lung: Secondary | ICD-10-CM | POA: Diagnosis not present

## 2016-03-24 DIAGNOSIS — Z510 Encounter for antineoplastic radiation therapy: Secondary | ICD-10-CM | POA: Diagnosis not present

## 2016-03-24 DIAGNOSIS — C7801 Secondary malignant neoplasm of right lung: Secondary | ICD-10-CM | POA: Diagnosis not present

## 2016-03-24 DIAGNOSIS — Z87891 Personal history of nicotine dependence: Secondary | ICD-10-CM | POA: Diagnosis not present

## 2016-03-25 ENCOUNTER — Ambulatory Visit
Admission: RE | Admit: 2016-03-25 | Discharge: 2016-03-25 | Disposition: A | Discharge status: Home / Self Care | Payer: Medicare Other | Source: Ambulatory Visit | Attending: Radiation Oncology | Admitting: Radiation Oncology

## 2016-03-25 DIAGNOSIS — C7801 Secondary malignant neoplasm of right lung: Secondary | ICD-10-CM | POA: Diagnosis not present

## 2016-03-25 DIAGNOSIS — Z510 Encounter for antineoplastic radiation therapy: Secondary | ICD-10-CM | POA: Diagnosis not present

## 2016-03-25 DIAGNOSIS — C3411 Malignant neoplasm of upper lobe, right bronchus or lung: Secondary | ICD-10-CM | POA: Diagnosis not present

## 2016-03-25 DIAGNOSIS — C3412 Malignant neoplasm of upper lobe, left bronchus or lung: Secondary | ICD-10-CM | POA: Diagnosis not present

## 2016-03-25 DIAGNOSIS — Z87891 Personal history of nicotine dependence: Secondary | ICD-10-CM | POA: Diagnosis not present

## 2016-03-26 ENCOUNTER — Inpatient Hospital Stay: Payer: Medicare Other | Attending: Oncology

## 2016-03-26 ENCOUNTER — Ambulatory Visit: Payer: Self-pay | Admitting: Radiation Oncology

## 2016-03-26 ENCOUNTER — Ambulatory Visit
Admission: RE | Admit: 2016-03-26 | Discharge: 2016-03-26 | Disposition: A | Discharge status: Home / Self Care | Payer: Medicare Other | Source: Ambulatory Visit | Attending: Radiation Oncology | Admitting: Radiation Oncology

## 2016-03-26 ENCOUNTER — Inpatient Hospital Stay: Admission: RE | Admit: 2016-03-26 | Payer: Self-pay | Source: Ambulatory Visit | Admitting: Radiation Oncology

## 2016-03-26 DIAGNOSIS — C3491 Malignant neoplasm of unspecified part of right bronchus or lung: Secondary | ICD-10-CM

## 2016-03-26 DIAGNOSIS — C3411 Malignant neoplasm of upper lobe, right bronchus or lung: Secondary | ICD-10-CM | POA: Insufficient documentation

## 2016-03-26 DIAGNOSIS — C7801 Secondary malignant neoplasm of right lung: Secondary | ICD-10-CM | POA: Diagnosis not present

## 2016-03-26 DIAGNOSIS — Z87891 Personal history of nicotine dependence: Secondary | ICD-10-CM | POA: Insufficient documentation

## 2016-03-26 DIAGNOSIS — C3412 Malignant neoplasm of upper lobe, left bronchus or lung: Secondary | ICD-10-CM | POA: Diagnosis not present

## 2016-03-26 DIAGNOSIS — Z510 Encounter for antineoplastic radiation therapy: Secondary | ICD-10-CM | POA: Diagnosis not present

## 2016-03-26 LAB — CBC
HEMATOCRIT: 35.1 % — AB (ref 40.0–52.0)
HEMOGLOBIN: 12.2 g/dL — AB (ref 13.0–18.0)
MCH: 31 pg (ref 26.0–34.0)
MCHC: 34.9 g/dL (ref 32.0–36.0)
MCV: 88.7 fL (ref 80.0–100.0)
Platelets: 270 10*3/uL (ref 150–440)
RBC: 3.96 MIL/uL — ABNORMAL LOW (ref 4.40–5.90)
RDW: 13 % (ref 11.5–14.5)
WBC: 4.7 10*3/uL (ref 3.8–10.6)

## 2016-03-27 ENCOUNTER — Ambulatory Visit
Admission: RE | Admit: 2016-03-27 | Discharge: 2016-03-27 | Disposition: A | Discharge status: Home / Self Care | Payer: Medicare Other | Source: Ambulatory Visit | Attending: Radiation Oncology | Admitting: Radiation Oncology

## 2016-03-27 DIAGNOSIS — Z510 Encounter for antineoplastic radiation therapy: Secondary | ICD-10-CM | POA: Diagnosis not present

## 2016-03-27 DIAGNOSIS — Z87891 Personal history of nicotine dependence: Secondary | ICD-10-CM | POA: Diagnosis not present

## 2016-03-27 DIAGNOSIS — C7801 Secondary malignant neoplasm of right lung: Secondary | ICD-10-CM | POA: Diagnosis not present

## 2016-03-27 DIAGNOSIS — C3411 Malignant neoplasm of upper lobe, right bronchus or lung: Secondary | ICD-10-CM | POA: Diagnosis not present

## 2016-03-28 ENCOUNTER — Ambulatory Visit
Admission: RE | Admit: 2016-03-28 | Discharge: 2016-03-28 | Disposition: A | Discharge status: Home / Self Care | Payer: Medicare Other | Source: Ambulatory Visit | Attending: Radiation Oncology | Admitting: Radiation Oncology

## 2016-03-28 DIAGNOSIS — C3411 Malignant neoplasm of upper lobe, right bronchus or lung: Secondary | ICD-10-CM | POA: Diagnosis not present

## 2016-03-28 DIAGNOSIS — Z510 Encounter for antineoplastic radiation therapy: Secondary | ICD-10-CM | POA: Diagnosis not present

## 2016-03-28 DIAGNOSIS — C7801 Secondary malignant neoplasm of right lung: Secondary | ICD-10-CM | POA: Diagnosis not present

## 2016-03-28 DIAGNOSIS — Z87891 Personal history of nicotine dependence: Secondary | ICD-10-CM | POA: Diagnosis not present

## 2016-03-28 DIAGNOSIS — C3412 Malignant neoplasm of upper lobe, left bronchus or lung: Secondary | ICD-10-CM | POA: Diagnosis not present

## 2016-03-31 ENCOUNTER — Ambulatory Visit
Admission: RE | Admit: 2016-03-31 | Discharge: 2016-03-31 | Disposition: A | Discharge status: Home / Self Care | Payer: Medicare Other | Source: Ambulatory Visit | Attending: Radiation Oncology | Admitting: Radiation Oncology

## 2016-03-31 DIAGNOSIS — C7801 Secondary malignant neoplasm of right lung: Secondary | ICD-10-CM | POA: Diagnosis not present

## 2016-03-31 DIAGNOSIS — Z87891 Personal history of nicotine dependence: Secondary | ICD-10-CM | POA: Diagnosis not present

## 2016-03-31 DIAGNOSIS — C3411 Malignant neoplasm of upper lobe, right bronchus or lung: Secondary | ICD-10-CM | POA: Diagnosis not present

## 2016-03-31 DIAGNOSIS — C3412 Malignant neoplasm of upper lobe, left bronchus or lung: Secondary | ICD-10-CM | POA: Diagnosis not present

## 2016-03-31 DIAGNOSIS — Z510 Encounter for antineoplastic radiation therapy: Secondary | ICD-10-CM | POA: Diagnosis not present

## 2016-04-01 ENCOUNTER — Ambulatory Visit
Admission: RE | Admit: 2016-04-01 | Discharge: 2016-04-01 | Disposition: A | Discharge status: Home / Self Care | Payer: Medicare Other | Source: Ambulatory Visit | Attending: Radiation Oncology | Admitting: Radiation Oncology

## 2016-04-01 DIAGNOSIS — C3411 Malignant neoplasm of upper lobe, right bronchus or lung: Secondary | ICD-10-CM | POA: Diagnosis not present

## 2016-04-01 DIAGNOSIS — C7801 Secondary malignant neoplasm of right lung: Secondary | ICD-10-CM | POA: Diagnosis not present

## 2016-04-01 DIAGNOSIS — Z510 Encounter for antineoplastic radiation therapy: Secondary | ICD-10-CM | POA: Diagnosis not present

## 2016-04-01 DIAGNOSIS — C3412 Malignant neoplasm of upper lobe, left bronchus or lung: Secondary | ICD-10-CM | POA: Diagnosis not present

## 2016-04-02 ENCOUNTER — Ambulatory Visit: Payer: Medicare Other

## 2016-04-04 ENCOUNTER — Telehealth: Payer: Self-pay | Admitting: *Deleted

## 2016-04-04 ENCOUNTER — Other Ambulatory Visit: Payer: Self-pay | Admitting: *Deleted

## 2016-04-04 DIAGNOSIS — C7801 Secondary malignant neoplasm of right lung: Secondary | ICD-10-CM

## 2016-04-04 MED ORDER — DEXAMETHASONE 4 MG PO TABS: 4.0000 mg | ORAL_TABLET | Freq: Every day | ORAL | Status: DC

## 2016-04-04 MED ORDER — AZITHROMYCIN 250 MG PO TABS: ORAL_TABLET | ORAL | Status: DC

## 2016-04-04 NOTE — Telephone Encounter (Signed)
Pt called to report cough and chest tightness that started on Wednesday.  Per Dr. Baruch Gouty a Z-pack and Decadron called in to [place].  Pt also told to take mucinex and call back if no improvement over the weekend.

## 2016-04-04 NOTE — Telephone Encounter (Signed)
Encounter opened in error

## 2016-04-14 ENCOUNTER — Inpatient Hospital Stay: Payer: Medicare Other | Attending: Oncology

## 2016-04-14 ENCOUNTER — Other Ambulatory Visit: Payer: Self-pay | Admitting: *Deleted

## 2016-04-14 ENCOUNTER — Ambulatory Visit
Admission: RE | Admit: 2016-04-14 | Discharge: 2016-04-14 | Disposition: A | Discharge status: Home / Self Care | Payer: Medicare Other | Source: Ambulatory Visit | Attending: Radiation Oncology | Admitting: Radiation Oncology

## 2016-04-14 DIAGNOSIS — C7801 Secondary malignant neoplasm of right lung: Secondary | ICD-10-CM

## 2016-04-14 DIAGNOSIS — Z87891 Personal history of nicotine dependence: Secondary | ICD-10-CM | POA: Diagnosis not present

## 2016-04-14 DIAGNOSIS — C3411 Malignant neoplasm of upper lobe, right bronchus or lung: Secondary | ICD-10-CM | POA: Insufficient documentation

## 2016-04-14 DIAGNOSIS — R079 Chest pain, unspecified: Secondary | ICD-10-CM | POA: Diagnosis not present

## 2016-04-14 DIAGNOSIS — Z452 Encounter for adjustment and management of vascular access device: Secondary | ICD-10-CM | POA: Insufficient documentation

## 2016-04-14 DIAGNOSIS — C801 Malignant (primary) neoplasm, unspecified: Secondary | ICD-10-CM

## 2016-04-14 MED ORDER — SODIUM CHLORIDE 0.9% FLUSH
10.0000 mL | INTRAVENOUS | Status: DC | PRN
  Administered 2016-04-14: 10 mL via INTRAVENOUS
  Filled 2016-04-14: qty 10

## 2016-04-14 MED ORDER — DEXAMETHASONE 4 MG PO TABS: 4.0000 mg | ORAL_TABLET | Freq: Two times a day (BID) | ORAL | Status: DC

## 2016-04-14 MED ORDER — HEPARIN SOD (PORK) LOCK FLUSH 100 UNIT/ML IV SOLN
500.0000 [IU] | Freq: Once | INTRAVENOUS | Status: AC
  Administered 2016-04-14: 500 [IU] via INTRAVENOUS
  Filled 2016-04-14: qty 5

## 2016-04-20 ENCOUNTER — Emergency Department
Admission: EM | Admit: 2016-04-20 | Discharge: 2016-04-20 | Disposition: A | Discharge status: Home / Self Care | Payer: Medicare Other | Attending: Emergency Medicine | Admitting: Emergency Medicine

## 2016-04-20 ENCOUNTER — Encounter: Payer: Self-pay | Admitting: *Deleted

## 2016-04-20 DIAGNOSIS — Z87891 Personal history of nicotine dependence: Secondary | ICD-10-CM | POA: Diagnosis not present

## 2016-04-20 DIAGNOSIS — R079 Chest pain, unspecified: Secondary | ICD-10-CM | POA: Diagnosis not present

## 2016-04-20 DIAGNOSIS — R0789 Other chest pain: Secondary | ICD-10-CM | POA: Insufficient documentation

## 2016-04-20 DIAGNOSIS — Z85118 Personal history of other malignant neoplasm of bronchus and lung: Secondary | ICD-10-CM | POA: Insufficient documentation

## 2016-04-20 DIAGNOSIS — M1990 Unspecified osteoarthritis, unspecified site: Secondary | ICD-10-CM | POA: Diagnosis not present

## 2016-04-20 MED ORDER — ALUM & MAG HYDROXIDE-SIMETH 200-200-20 MG/5ML PO SUSP
30.0000 mL | Freq: Once | ORAL | Status: AC
  Administered 2016-04-20: 30 mL via ORAL
  Filled 2016-04-20: qty 30

## 2016-04-20 NOTE — ED Notes (Signed)
Pt presents w/ c/o increased dyspnea and uncontrolled pain in R upper arm, R axilla, R shoulder and R ribs. Pt had last radiation tx approximately 3 weeks ago. Pt has primary lung CA that is reported to have not metastasized. Pt has persistent congested cough and sounds profoundly short of breath when speaking. Pt is presently taking tramadol, dexamethasone and ibuprofen for pain.

## 2016-04-20 NOTE — ED Notes (Signed)
Pt. States right flack pain starting below rt. Arm pit that started around 2030 tonight.  Pt. States he is a lung cancer patient.   Pt. States he has finished chemo and radiation therapy, Pt. States he finished 3 rounds of both.  Pt. States he was given tramadol and this past week a steroid to help with pain.  Pt. States pain became unbearable tonight, so he took some ibuprofen that gave no relief at time.  Pt. Has no complaints of pain at this time.

## 2016-04-20 NOTE — ED Provider Notes (Signed)
[facility] Emergency Department Provider Note   ____________________________________________  Time seen: Approximately 11:46 PM  I have reviewed the triage vital signs and the nursing notes.   HISTORY  Chief Complaint   HPI Christopher Huber is a 70 y.o. male has lung cancer has had chemotherapy and radiation. He reports for the last few weeks since his last radiation treatment had pain in the right accelerating down the right side of his chest. Tonight he had another episode of that much worse than previous episodes he was pain was so bad that it took his breath. Patient denies any fever he has been coughing up some green material for the last week or so. His doctor is aware of all these things. Patient reports the pain today again was the same quality just worse than previously. He has been to see his doctor Dr. Donella Stade tomorrow. Patient really is unwilling to have me do anything else tonight since the pain is much better after taking 2 over-the-counter Motrin's. She says she now feels well. His EKG was essentially normal and discussed the risk of blood clots etc. with him but he really doesn't want anything else to be checked tonight. Patient is back to baseline again. At the time the patient was sharp severe and not made better or worse by anything. It went away after the Motrin.Patient understand the risk of blood clots killing him tonight. He still does not want me to do anything else.   Past Medical History  Diagnosis Date  . Lung cancer ([place])   . Arthritis     right arm  . GERD (gastroesophageal reflux disease)     Patient Active Problem List   Diagnosis Date Noted  . Right-sided thoracic back pain 01/25/2016  . H/O malignant neoplasm 11/07/2015  . Cough 09/11/2015  . GERD (gastroesophageal reflux disease)   . Lung cancer ([place]) 06/18/2015    Past Surgical History  Procedure Laterality Date  . Colonoscopy    . Esophagogastroduodenoscopy    .  Left shoulder reconstruction  ~1965    after hunting accident    Current Outpatient Rx  Name  Route  Sig  Dispense  Refill  . Ascorbic Acid (VITAMIN C) 1000 MG tablet   Oral   Take 1,000 mg by mouth daily.         Marland Kitchen azithromycin (ZITHROMAX Z-PAK) 250 MG tablet      1-pack, follow package directions   6 each   0   . Cholecalciferol (VITAMIN D) 2000 UNITS CAPS   Oral   Take 1 capsule by mouth daily.         Marland Kitchen dexamethasone (DECADRON) 4 MG tablet   Oral   Take 1 tablet (4 mg total) by mouth 2 (two) times daily. Take twice daily x7 days, then 1 tablet daily for 7 days, then 1 every other day   25 tablet   0   . GENERLAC 10 GM/15ML SOLN      Reported on 03/05/2016      1     Dispense as written.   . lidocaine-prilocaine (EMLA) cream      Apply to affected area once   30 g   3   . magnesium oxide (MAG-OX) 400 MG tablet   Oral   Take 400 mg by mouth daily.         . Multiple Vitamin (MULTIVITAMIN WITH MINERALS) TABS   Oral   Take 1 tablet by mouth daily.         Marland Kitchen  ranitidine (ZANTAC) 150 MG tablet      TAKE 1 TABLET BY MOUTH TWICE A DAY   60 tablet   11   . vitamin E 400 UNIT capsule   Oral   Take 400 Units by mouth daily.           Allergies Pemetrexed and Codeine  Family History  Problem Relation Age of Onset  . Heart disease Father   . Diabetes Neg Hx   . Cancer Neg Hx     Social History Social History  Substance Use Topics  . Smoking status: Former Smoker    Quit date: 06/27/1997  . Smokeless tobacco: Never Used  . Alcohol Use: No    Review of Systems Constitutional: No fever/chills Eyes: No visual changes. ENT: No sore throat. Cardiovascular:  chest pain.Now resolved Respiratory: shortness of breath. Now resolved Gastrointestinal: No abdominal pain.  No nausea, no vomiting.  No diarrhea.  No constipation. Genitourinary: Negative for dysuria. Musculoskeletal: Negative for back pain. Skin: Negative for rash. Neurological:  Negative for headaches, focal weakness or numbness.  10-point ROS otherwise negative.  ____________________________________________   PHYSICAL EXAM:  VITAL SIGNS: ED Triage Vitals  Enc Vitals Group     BP 04/20/16 2151 119/74 mmHg     Pulse Rate 04/20/16 2151 95     Resp 04/20/16 2151 18     Temp 04/20/16 2151 98.2 F (36.8 C)     Temp Source 04/20/16 2151 Oral     SpO2 04/20/16 2151 97 %     Weight 04/20/16 2151 190 lb (86.183 kg)     Height 04/20/16 2151 '5\' 9"'$  (1.753 m)     Head Cir --      Peak Flow --      Pain Score 04/20/16 2209 5     Pain Loc --      Pain Edu? --      Excl. in [place]? --    Current heart rate when I'm in the room is 85.  Constitutional: Alert and oriented. Well appearing and in no acute distress. Eyes: Conjunctivae are normal. PERRL. EOMI. Head: Atraumatic. Nose: No congestion/rhinnorhea. Mouth/Throat: Mucous membranes are moist.  Oropharynx non-erythematous. Neck: No stridor.   Cardiovascular: Normal rate, regular rhythm. Grossly normal heart sounds.  Good peripheral circulation. Respiratory: Normal respiratory effort.  No retractions. Lungs scattered gurgles. Patient reports he's had these for a couple weeks. Gastrointestinal: Soft and nontender. No distention. No abdominal bruits. No CVA tenderness. Musculoskeletal: No lower extremity tenderness nor edema.  No joint effusions. Neurologic:  Normal speech and language. No gross focal neurologic deficits are appreciated. No gait instability. Skin:  Skin is warm, dry and intact. No rash noted. Psychiatric: Mood and affect are normal. Speech and behavior are normal.  ____________________________________________   LABS (all labs ordered are listed, but only abnormal results are displayed)  Labs Reviewed - No data to display ____________________________________________  EKG  EKG read and interpreted by me shows normal sinus rhythm rate of 86 normal axis somewhat decreased R-wave progression  otherwise essentially normal EKG ____________________________________________  RADIOLOGY   chest x-ray declined by patient ____________________________________________   PROCEDURES    ____________________________________________   INITIAL IMPRESSION / ASSESSMENT AND PLAN / ED COURSE  Pertinent labs & imaging results that were available during my care of the patient were reviewed by me and considered in my medical decision making (see chart for details).  As patient is feeling well now and has no further symptoms after only 400 mg  of Motrin and he does not want me to pursue any further workup I will comply with the patient's wishes. He is aware of the fact that I cannot really tell what is going on with him. He is also aware the fact that he is at higher risk than most people for a blood clot which could present in a similar fashion where he has. And he is also aware that if she did have a blood clot he might not make it through until morning to see his doctor. He still elects to go home. ____________________________________________   FINAL CLINICAL IMPRESSION(S) / ED DIAGNOSES  Final diagnoses:  Chest pain of uncertain etiology      NEW MEDICATIONS STARTED DURING THIS VISIT:  New Prescriptions   No medications on file     Note:  This document was prepared using Dragon voice recognition software and may include unintentional dictation errors.    Nena Polio, MD 04/20/16 [phone]

## 2016-04-21 ENCOUNTER — Telehealth: Payer: Self-pay

## 2016-04-21 NOTE — Telephone Encounter (Signed)
Spoke to patient. He said he is a cancer patient doing chemotherapy. This was the first time he had a reaction to the treatment. He is feeling better now.

## 2016-04-23 ENCOUNTER — Encounter: Payer: Self-pay | Admitting: Family Medicine

## 2016-04-23 ENCOUNTER — Ambulatory Visit (INDEPENDENT_AMBULATORY_CARE_PROVIDER_SITE_OTHER): Payer: Medicare Other | Admitting: Family Medicine

## 2016-04-23 ENCOUNTER — Ambulatory Visit (INDEPENDENT_AMBULATORY_CARE_PROVIDER_SITE_OTHER)
Admission: RE | Admit: 2016-04-23 | Discharge: 2016-04-23 | Disposition: A | Discharge status: Home / Self Care | Payer: Medicare Other | Source: Ambulatory Visit | Attending: Family Medicine | Admitting: Family Medicine

## 2016-04-23 VITALS — BP 110/72 | HR 95 | Temp 98.5°F | Ht 69.0 in | Wt 189.5 lb

## 2016-04-23 DIAGNOSIS — R059 Cough, unspecified: Secondary | ICD-10-CM

## 2016-04-23 DIAGNOSIS — R0602 Shortness of breath: Secondary | ICD-10-CM | POA: Diagnosis not present

## 2016-04-23 DIAGNOSIS — C3480 Malignant neoplasm of overlapping sites of unspecified bronchus and lung: Secondary | ICD-10-CM

## 2016-04-23 DIAGNOSIS — R05 Cough: Secondary | ICD-10-CM | POA: Diagnosis not present

## 2016-04-23 MED ORDER — DOXYCYCLINE HYCLATE 100 MG PO TABS: 100.0000 mg | ORAL_TABLET | Freq: Two times a day (BID) | ORAL | Status: DC

## 2016-04-23 MED ORDER — HYDROCODONE-HOMATROPINE 5-1.5 MG/5ML PO SYRP: ORAL_SOLUTION | ORAL | Status: DC

## 2016-04-23 NOTE — Progress Notes (Signed)
Dr. Frederico Hamman T. Shuntae Herzig, MD, [facility] [place], [address] Phone: [phone] Fax: [phone]  04/23/2016  Patient: Christopher Huber, MRN: [id number], DOB: June 30, 1946, 70 y.o.  Primary Physician:  Viviana Simpler, MD   Chief Complaint  Patient presents with  . Cough    with Chest Congestion  . Shortness of Breath   Subjective:   Christopher Huber is a 70 y.o. very pleasant male patient who presents with the following:  Active lung cancer under treatment: Just finished radiation.  Going to the cancer center in [place]. [place] [facility] [place] is RADS. Finished radiation a couple of weeks ago. Has developed a lot of congestion. CT scan in 2 weeks - rattling and cough in his chest.   Taking some Mucinex. Would like some relief before then.  Has tried OTC cough and cold remedies.  Nothing seems to help.   He is getting a little bit short winded, and he also is coughing some without significant production of sputum.  No hemoptysis.  Over-the-counter cough medications don't really seem to help much at all.  He is making some sounds when he is breathing that he can hear audibly.  CC: Dr. Grayland Ormond  Past Medical History, Surgical History, Social History, Family History, Problem List, Medications, and Allergies have been reviewed and updated if relevant.  Patient Active Problem List   Diagnosis Date Noted  . Right-sided thoracic back pain 01/25/2016  . H/O malignant neoplasm 11/07/2015  . Cough 09/11/2015  . GERD (gastroesophageal reflux disease)   . Lung cancer ([place]) 06/18/2015    Past Medical History  Diagnosis Date  . Lung cancer ([place])   . Arthritis     right arm  . GERD (gastroesophageal reflux disease)     Past Surgical History  Procedure Laterality Date  . Colonoscopy    . Esophagogastroduodenoscopy    . Left shoulder reconstruction  ~1965    after hunting accident    Social History   Social  History  . Marital Status: Married    Spouse Name: N/A  . Number of Children: 2  . Years of Education: N/A   Occupational History  . 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 Smoking status: Former Smoker    Quit date: 06/27/1997  . Smokeless tobacco: Never Used  . Alcohol Use: No  . Drug Use: No  . Sexual Activity: Not on file   Other Topics Concern  . Not on file   Social History Narrative   2 daughters      Has living will   Wife is health care POA   Would accept resuscitation at this point   Would consider tube feedings if prognosis is unclear    Family History  Problem Relation Age of Onset  . Heart disease Father   . Diabetes Neg Hx   . Cancer Neg Hx     Allergies  Allergen Reactions  . Pemetrexed Rash  . Codeine Nausea And Vomiting    Dizzy     Medication list reviewed and updated in full in [facility].  ROS: GEN: Acute illness details above GI: Tolerating PO intake GU: maintaining adequate hydration and urination Pulm: Some increased SOB Interactive and getting along well at home.  Otherwise, ROS is as per the HPI.  Objective:   BP 110/72 mmHg  Pulse 95  Temp(Src) 98.5 F (36.9 C) (  Oral)  Ht '5\' 9"'$  (1.753 m)  Wt 189 lb 8 oz (85.957 kg)  BMI 27.97 kg/m2  SpO2 94%   GEN: A and O x 3. WDWN. NAD.    ENT: Nose clear, ext NML.  No LAD.  No JVD.  TM's clear. Oropharynx clear.  PULM: Normal WOB, no distress. Intermittent rhonchorous sounds in bilateral lung fields and additional some crackles without focal location.  Minimal wheezing. CV: RRR, no M/G/R, No rubs, No JVD.   EXT: warm and well-perfused, No c/c/e. PSYCH: Pleasant and conversant.    Laboratory and Imaging Data: Dg Chest 2 View  04/23/2016  CLINICAL DATA:  Cough, shortness of breath, former smoker, history of lung malignancy with most recent radiation treatments several weeks ago EXAM: CHEST  2 VIEW COMPARISON:  PA and  lateral chest x-ray of Apr 14, 2016 FINDINGS: The lungs are adequately inflated. There are coarse interstitial markings in the right upper lobe with pleural thickening which appears stable. There are chronically increased lung markings in the left upper lobe which may reflect scarring. There is sub cm nodularity projecting over the posterior lateral aspect of the right eighth rib which is stable. The heart is normal in size. The pulmonary vascularity is not engorged. The bony thorax exhibits no acute abnormality. IMPRESSION: Stable appearance of the chest since the Apr 14, 2016 study. There is no new mass, pneumonia, nor CHF. Electronically Signed   By: David  Martinique M.D.   On: 04/23/2016 16:48   Dg Chest 2 View  04/14/2016  CLINICAL DATA:  Right axillary pain. History of lung cancer. Radiation therapy 2 weeks ago. EXAM: CHEST  2 VIEW COMPARISON:  PET-CT dated 02/04/2016 and chest x-ray dated 01/25/2016 and 09/23/2015 FINDINGS: Power port remains unchanged position. Scarring in the right upper lung zone and irregular pleural thickening appear essentially unchanged. Small pleural calcification at the right lung base laterally is unchanged. Scarring in the left upper lobe and around the superior mediastinum to the right and left are also unchanged. Heart size and vascularity are normal. No effusions. No acute bone abnormality. IMPRESSION: No change in the appearance of the chest since 01/25/2016. No acute abnormality. Electronically Signed   By: Lorriane Shire M.D.   On: 04/14/2016 14:40     Assessment and Plan:   Cough - Plan: DG Chest 2 View  Malignant neoplasm of overlapping sites of lung, unspecified laterality ([place]) - Plan: DG Chest 2 View  Shortness of breath  94% pulse ox. Underlying pulmonary malignancy.  Recently finished radiation.  Pulmonary exam appears worse than the patient's chest x-ray.  Agree, on my exam, no significant change from prior chest x-rays.  He has an upcoming chest  CT.  Given previously normal pulmonary exams in the chart, I'm going to treat this as if the patient has developed pneumonia.  Increased risk.  Change in status of cancer cannot be excluded. He has upcoming chest CT and Oncology appt on 05/08/2016.  Cc: Drs. Mellissa Kohut  Follow-up: if needed  New Prescriptions   DOXYCYCLINE (VIBRA-TABS) 100 MG TABLET    Take 1 tablet (100 mg total) by mouth 2 (two) times daily.   HYDROCODONE-HOMATROPINE (HYCODAN) 5-1.5 MG/5ML SYRUP    1 tsp po at night before bed prn cough   Orders Placed This Encounter  Procedures  . DG Chest 2 View    Signed,  Frederico Hamman T. Bianney Rockwood, MD   Patient's Medications  New Prescriptions   DOXYCYCLINE (VIBRA-TABS) 100 MG TABLET  Take 1 tablet (100 mg total) by mouth 2 (two) times daily.   HYDROCODONE-HOMATROPINE (HYCODAN) 5-1.5 MG/5ML SYRUP    1 tsp po at night before bed prn cough  Previous Medications   ASCORBIC ACID (VITAMIN C) 1000 MG TABLET    Take 1,000 mg by mouth daily.   CHOLECALCIFEROL (VITAMIN D) 2000 UNITS CAPS    Take 1 capsule by mouth daily.   DEXAMETHASONE (DECADRON) 4 MG TABLET    Take 1 tablet (4 mg total) by mouth 2 (two) times daily. Take twice daily x7 days, then 1 tablet daily for 7 days, then 1 every other day   GENERLAC 10 GM/15ML SOLN    Reported on 03/05/2016   LIDOCAINE-PRILOCAINE (EMLA) CREAM    Apply to affected area once   MAGNESIUM OXIDE (MAG-OX) 400 MG TABLET    Take 400 mg by mouth daily.   MULTIPLE VITAMIN (MULTIVITAMIN WITH MINERALS) TABS    Take 1 tablet by mouth daily.   RANITIDINE (ZANTAC) 150 MG TABLET    TAKE 1 TABLET BY MOUTH TWICE A DAY   TRAMADOL (ULTRAM) 50 MG TABLET    Take 50 mg by mouth every 6 (six) hours as needed.   VITAMIN E 400 UNIT CAPSULE    Take 400 Units by mouth daily.  Modified Medications   No medications on file  Discontinued Medications   AZITHROMYCIN (ZITHROMAX Z-PAK) 250 MG TABLET    1-pack, follow package directions

## 2016-04-23 NOTE — Progress Notes (Signed)
Pre visit review using our clinic review tool, if applicable. No additional management support is needed unless otherwise documented below in the visit note. 

## 2016-05-07 ENCOUNTER — Ambulatory Visit
Admission: RE | Admit: 2016-05-07 | Discharge: 2016-05-07 | Disposition: A | Discharge status: Home / Self Care | Payer: Medicare Other | Source: Ambulatory Visit | Attending: Oncology | Admitting: Oncology

## 2016-05-07 DIAGNOSIS — C3491 Malignant neoplasm of unspecified part of right bronchus or lung: Secondary | ICD-10-CM | POA: Diagnosis not present

## 2016-05-07 DIAGNOSIS — C3492 Malignant neoplasm of unspecified part of left bronchus or lung: Secondary | ICD-10-CM | POA: Diagnosis not present

## 2016-05-07 DIAGNOSIS — I2510 Atherosclerotic heart disease of native coronary artery without angina pectoris: Secondary | ICD-10-CM | POA: Insufficient documentation

## 2016-05-07 DIAGNOSIS — R918 Other nonspecific abnormal finding of lung field: Secondary | ICD-10-CM | POA: Insufficient documentation

## 2016-05-07 DIAGNOSIS — C3490 Malignant neoplasm of unspecified part of unspecified bronchus or lung: Secondary | ICD-10-CM | POA: Insufficient documentation

## 2016-05-07 DIAGNOSIS — N289 Disorder of kidney and ureter, unspecified: Secondary | ICD-10-CM | POA: Insufficient documentation

## 2016-05-07 LAB — POCT I-STAT CREATININE: CREATININE: 1 mg/dL (ref 0.61–1.24)

## 2016-05-07 MED ORDER — IOPAMIDOL (ISOVUE-300) INJECTION 61%
75.0000 mL | Freq: Once | INTRAVENOUS | Status: AC | PRN
  Administered 2016-05-07: 75 mL via INTRAVENOUS

## 2016-05-08 ENCOUNTER — Inpatient Hospital Stay: Payer: Medicare Other | Attending: Oncology | Admitting: Oncology

## 2016-05-08 ENCOUNTER — Ambulatory Visit: Payer: Self-pay | Admitting: Radiation Oncology

## 2016-05-08 VITALS — BP 126/75 | HR 89 | Temp 97.6°F | Resp 18 | Wt 191.6 lb

## 2016-05-08 DIAGNOSIS — Z85118 Personal history of other malignant neoplasm of bronchus and lung: Secondary | ICD-10-CM | POA: Insufficient documentation

## 2016-05-08 DIAGNOSIS — R05 Cough: Secondary | ICD-10-CM | POA: Insufficient documentation

## 2016-05-08 DIAGNOSIS — G4700 Insomnia, unspecified: Secondary | ICD-10-CM | POA: Insufficient documentation

## 2016-05-08 DIAGNOSIS — Z79899 Other long term (current) drug therapy: Secondary | ICD-10-CM | POA: Diagnosis not present

## 2016-05-08 DIAGNOSIS — R110 Nausea: Secondary | ICD-10-CM | POA: Diagnosis not present

## 2016-05-08 DIAGNOSIS — K219 Gastro-esophageal reflux disease without esophagitis: Secondary | ICD-10-CM

## 2016-05-08 DIAGNOSIS — M25511 Pain in right shoulder: Secondary | ICD-10-CM | POA: Diagnosis not present

## 2016-05-08 DIAGNOSIS — M1990 Unspecified osteoarthritis, unspecified site: Secondary | ICD-10-CM | POA: Insufficient documentation

## 2016-05-08 DIAGNOSIS — Z87891 Personal history of nicotine dependence: Secondary | ICD-10-CM | POA: Insufficient documentation

## 2016-05-08 DIAGNOSIS — R200 Anesthesia of skin: Secondary | ICD-10-CM | POA: Diagnosis not present

## 2016-05-08 DIAGNOSIS — K5900 Constipation, unspecified: Secondary | ICD-10-CM | POA: Insufficient documentation

## 2016-05-08 DIAGNOSIS — Z8701 Personal history of pneumonia (recurrent): Secondary | ICD-10-CM | POA: Diagnosis not present

## 2016-05-08 DIAGNOSIS — C3480 Malignant neoplasm of overlapping sites of unspecified bronchus and lung: Secondary | ICD-10-CM

## 2016-05-08 NOTE — Progress Notes (Signed)
Patient has just finished antibiotics for pneumonia diagnosed by PCP and does have a chronic cough that is being treated with prescription cough syrup by PCP.  Does have right side numbness on the back of his arms that radiates down his side.

## 2016-05-12 ENCOUNTER — Encounter: Payer: Self-pay | Admitting: Radiation Oncology

## 2016-05-12 ENCOUNTER — Ambulatory Visit
Admission: RE | Admit: 2016-05-12 | Discharge: 2016-05-12 | Disposition: A | Discharge status: Home / Self Care | Payer: Medicare Other | Source: Ambulatory Visit | Attending: Radiation Oncology | Admitting: Radiation Oncology

## 2016-05-12 VITALS — BP 139/86 | HR 86 | Temp 97.8°F | Resp 21 | Wt 192.6 lb

## 2016-05-12 DIAGNOSIS — C3412 Malignant neoplasm of upper lobe, left bronchus or lung: Secondary | ICD-10-CM

## 2016-05-12 DIAGNOSIS — Z923 Personal history of irradiation: Secondary | ICD-10-CM | POA: Insufficient documentation

## 2016-05-12 DIAGNOSIS — C3492 Malignant neoplasm of unspecified part of left bronchus or lung: Secondary | ICD-10-CM | POA: Diagnosis not present

## 2016-05-12 NOTE — Progress Notes (Signed)
Radiation Oncology Follow up Note  Name: Christopher Huber   Date:   05/12/2016 MRN:  [id number] DOB: 1946-03-02    This 70 y.o. male presents to the clinic today for follow-up for recurrent right-sided lung cancer now out 1 month from salvage treatment.  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 We treated with a hypofractionated course to 5000 cGy he is now seen out 1 month. Initially was having some significant cough and chest pain which is completely subsided. Recently had a CT scan of the chest. 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 with no convincing evidence of residual or recurrent disease. Scan on my review looks excellent. He specifically denies cough hemoptysis or chest tightness.  COMPLICATIONS OF TREATMENT: none  FOLLOW UP COMPLIANCE: keeps appointments   PHYSICAL EXAM:  BP 139/86 mmHg  Pulse 86  Temp(Src) 97.8 F (36.6 C)  Resp 21  Wt 192 lb 9.2 oz (87.35 kg) Well-developed well-nourished patient in NAD. HEENT reveals PERLA, EOMI, discs not visualized.  Oral cavity is clear. No oral mucosal lesions are identified. Neck is clear without evidence of cervical or supraclavicular adenopathy. Lungs are clear to A&P. Cardiac examination is essentially unremarkable with regular rate and rhythm without murmur rub or thrill. Abdomen is benign with no organomegaly or masses noted. Motor sensory and DTR levels are equal and symmetric in the upper and lower extremities. Cranial nerves II through XII are grossly intact. Proprioception is intact. No peripheral adenopathy or edema is identified. No motor or sensory levels are noted. Crude visual fields are  within normal range.  RADIOLOGY RESULTS: Most recent CT scan is reviewed compatible with the above-stated findings  PLAN: Present time he is doing extremely well with almost complete response by CT criteria. I am please was overall progress. I've asked to see him back in 3-4 months for follow-up. Patient is to call sooner with any concerns.  I would like to take this opportunity to thank you for allowing me to participate in the care of your patient.Armstead Peaks., MD

## 2016-05-14 NOTE — Progress Notes (Signed)
[facility]  [phone] [phone]  ID: Christopher Huber OB: Sep 25, 1946  MR#: [id number]  PNT#:[id number]  Patient Care Team: Venia Carbon, MD as PCP - General (Internal Medicine)  CHIEF COMPLAINT:  Chief Complaint  Patient presents with  . Lung Cancer  . Results    INTERVAL HISTORY: Patient returns to clinic today for further evaluation and discussion of his imaging results. He recently finished a round of antibiotics for pneumonia. He also continues to have a chronic cough which is being treated by his primary care physician. He occasionally has right sided numbness down the back of his arms, but denies any pain. He otherwise feels well. He continues to have right shoulder pain. He does not complain of constipation today. He has difficulty sleeping. He has no neurologic complaints. He denies any fevers. He has no chest pain, shortness of breath, or hemoptysis.  He has no vomiting or diarrhea.  Patient offers no further specific complaints today.   REVIEW OF SYSTEMS:   Review of Systems  Constitutional: Negative for fever, weight loss and malaise/fatigue.  Respiratory: Positive for cough. Negative for hemoptysis, sputum production and shortness of breath.   Cardiovascular: Negative.  Negative for chest pain.  Gastrointestinal: Positive for nausea. Negative for vomiting and constipation.  Musculoskeletal: Positive for back pain and joint pain.  Skin: Negative for rash.  Neurological: Positive for tingling. Negative for weakness.    As per HPI. Otherwise, a complete review of systems is negatve.  PAST MEDICAL HISTORY: Past Medical History  Diagnosis Date  . Arthritis     right arm  . GERD (gastroesophageal reflux disease)   . Lung cancer [facility]) 2013    PAST SURGICAL HISTORY: Past Surgical History  Procedure Laterality Date  . Colonoscopy    . Esophagogastroduodenoscopy    . Left shoulder reconstruction  ~1965    after hunting  accident    FAMILY HISTORY Family History  Problem Relation Age of Onset  . Heart disease Father   . Diabetes Neg Hx   . Cancer Neg Hx        ADVANCED DIRECTIVES:    HEALTH MAINTENANCE: Social History  Substance Use Topics  . Smoking status: Former Smoker    Quit date: 06/27/1997  . Smokeless tobacco: Never Used  . Alcohol Use: No     Colonoscopy:  PAP:  Bone density:  Lipid panel:  Allergies  Allergen Reactions  . Pemetrexed Rash  . Codeine Nausea And Vomiting    Dizzy     Current Outpatient Prescriptions  Medication Sig Dispense Refill  . Ascorbic Acid (VITAMIN C) 1000 MG tablet Take 1,000 mg by mouth daily.    . Cholecalciferol (VITAMIN D) 2000 UNITS CAPS Take 1 capsule by mouth daily.    Marland Kitchen dexamethasone (DECADRON) 4 MG tablet Take 1 tablet (4 mg total) by mouth 2 (two) times daily. Take twice daily x7 days, then 1 tablet daily for 7 days, then 1 every other day (Patient not taking: Reported on 05/12/2016) 25 tablet 0  . GENERLAC 10 GM/15ML SOLN Reported on 03/05/2016  1  . HYDROcodone-homatropine (HYCODAN) 5-1.5 MG/5ML syrup 1 tsp po at night before bed prn cough 180 mL 0  . lidocaine-prilocaine (EMLA) cream Apply to affected area once 30 g 3  . magnesium oxide (MAG-OX) 400 MG tablet Take 400 mg by mouth daily.    . Multiple Vitamin (MULTIVITAMIN WITH MINERALS) TABS Take 1 tablet by mouth daily.    . ranitidine (ZANTAC)  150 MG tablet TAKE 1 TABLET BY MOUTH TWICE A DAY 60 tablet 11  . traMADol (ULTRAM) 50 MG tablet Take 50 mg by mouth every 6 (six) hours as needed.    . vitamin E 400 UNIT capsule Take 400 Units by mouth daily.     No current facility-administered medications for this visit.    OBJECTIVE: Filed Vitals:   05/08/16 1038  BP: 126/75  Pulse: 89  Temp: 97.6 F (36.4 C)  Resp: 18     Body mass index is 28.28 kg/(m^2).    ECOG FS:1 - Symptomatic but completely ambulatory  General: Well-developed, well-nourished, no acute distress. Eyes:  anicteric sclera. Lungs: Clear to auscultation bilaterally. Heart: Regular rate and rhythm. No rubs, murmurs, or gallops. Abdomen: Soft, nontender, nondistended. No organomegaly noted, normoactive bowel sounds. Musculoskeletal: No edema, cyanosis, or clubbing. Neuro: Alert, answering all questions appropriately. Cranial nerves grossly intact. Skin: Rash has resolved. Psych: Normal affect.  LAB RESULTS:  Lab Results  Component Value Date   NA 132* 02/06/2016   K 4.4 02/06/2016   CL 100* 02/06/2016   CO2 26 02/06/2016   GLUCOSE 87 02/06/2016   BUN 15 02/06/2016   CREATININE 1.00 05/07/2016   CALCIUM 9.0 02/06/2016   PROT 7.0 02/06/2016   ALBUMIN 3.9 02/06/2016   AST 16 02/06/2016   ALT 11* 02/06/2016   ALKPHOS 69 02/06/2016   BILITOT 0.4 02/06/2016   GFRNONAA >60 02/06/2016   GFRAA >60 02/06/2016    Lab Results  Component Value Date   WBC 4.7 03/26/2016   NEUTROABS 1.5 02/06/2016   HGB 12.2* 03/26/2016   HCT 35.1* 03/26/2016   MCV 88.7 03/26/2016   PLT 270 03/26/2016     STUDIES: Dg Chest 2 View  04/23/2016  CLINICAL DATA:  Cough, shortness of breath, former smoker, history of lung malignancy with most recent radiation treatments several weeks ago EXAM: CHEST  2 VIEW COMPARISON:  PA and lateral chest x-ray of Apr 14, 2016 FINDINGS: The lungs are adequately inflated. There are coarse interstitial markings in the right upper lobe with pleural thickening which appears stable. There are chronically increased lung markings in the left upper lobe which may reflect scarring. There is sub cm nodularity projecting over the posterior lateral aspect of the right eighth rib which is stable. The heart is normal in size. The pulmonary vascularity is not engorged. The bony thorax exhibits no acute abnormality. IMPRESSION: Stable appearance of the chest since the Apr 14, 2016 study. There is no new mass, pneumonia, nor CHF. Electronically Signed   By: David  Martinique M.D.   On: 04/23/2016 16:48    Ct Chest W Contrast  05/07/2016  CLINICAL DATA:  Restaging of lung cancer. Recent chemotherapy and radiation therapy. Ex-smoker, quitting 37 years ago. EXAM: CT CHEST WITH CONTRAST TECHNIQUE: Multidetector CT imaging of the chest was performed during intravenous contrast administration. CONTRAST:  69m ISOVUE-300 IOPAMIDOL (ISOVUE-300) INJECTION 61% COMPARISON:  Chest radiograph of 04/23/2016. PET of 02/04/2016. Most recent chest CT 09/23/2015. ) FINDINGS: Mediastinum/Nodes: No supraclavicular adenopathy. A right Port-A-Cath which terminates at the high right atrium. Aortic and branch vessel atherosclerosis. LAD and right coronary artery atherosclerosis. Normal heart size, without pericardial effusion. No mediastinal or hilar adenopathy. Previously described right hilar node/adenopathy is not identified. Lungs/Pleura: No pleural fluid. 3 mm right lower lobe subpleural pulmonary nodule on image 93/series 3 was likely present on the prior PET. A 3 mm superior segment right lower lobe pulmonary nodule on image 68/ series 3  is unchanged. Redemonstrated is radiation fibrosis in the right apex. The previously measured area of more focal consolidation is similar to slightly decreased today. 2.0 cm on image 33/series 3 versus 2.2 cm on the prior. There is also more posterior area of increased consolidation which measures 1.7 x 2.3 cm on image 40/series 3. Architectural distortion and consolidation with bronchiectasis in the posterior left apex are similar to slightly decreased. Upper abdomen: Normal imaged portions of the liver, spleen, stomach. Atypical appearance of retropancreatic fat versus a posterior pancreatic head/ neck lipoma. Example image 172/ series 2. Of doubtful clinical significance. Normal imaged portions of the gallbladder, adrenal glands. Left renal lesion measures 9 mm and 76 HU on image 17/ series 2. On the order of 60 HU on prior noncontrast PET. Similar in size back to 09/13/2014. Musculoskeletal:  No acute osseous abnormality. IMPRESSION: 1. Radiation fibrosis within both apices. No convincing evidence of recurrent or residual disease areas of more focal consolidation within the lateral right apex (similar) and posterior right apex (increased ) warrant followup attention. 2. No evidence of thoracic adenopathy. 3.  Atherosclerosis, including within the coronary arteries. 4. 9 mm left renal lesion which remains indeterminate, but was similar in size back to 09/13/2014. This could represent a complex cyst or a solid neoplasm. Recommend attention on follow-up. Electronically Signed   By: Abigail Miyamoto M.D.   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 T736mmutation was not detected.  PLAN:    1.  Lung cancer:  CT scan results reviewed independently and reported as above with no convincing evidence of recurrent or residual disease. No additional intervention is needed at this time. Return to clinic in 6 months with repeat laboratory work, further imaging, and further evaluation. Patient expressed understanding that he will likely need additional treatment in the future.  2.  Rash:  Resolved.  Secondary to pemetrexed which has been discontinued.  3.  Hypomagnesemia: Resolved. 4.  Cough: Patient states that OTC medications and Tessalon Perles do not work. He has an allergy to codeine. 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 Consider prednisone in the future. 5.  Nausea: Patient does not complain of this today.  Continue Zofran and Compazine as needed. 6.  Constipation: Continue stool softeners and Miralax.  Lactulose as needed.  7.  Pain: Tramadol did not help and patient cannot take narcotics given side effects. Monitor.  Patient expressed understanding and was in agreement with this plan. He also understands that He can call clinic at any time with any questions, concerns, or complaints.     TLloyd Huger MD   05/14/2016 10:27 PM

## 2016-05-26 ENCOUNTER — Inpatient Hospital Stay: Payer: Medicare Other

## 2016-05-26 DIAGNOSIS — Z85118 Personal history of other malignant neoplasm of bronchus and lung: Secondary | ICD-10-CM | POA: Diagnosis not present

## 2016-05-26 DIAGNOSIS — K219 Gastro-esophageal reflux disease without esophagitis: Secondary | ICD-10-CM | POA: Diagnosis not present

## 2016-05-26 DIAGNOSIS — Z95828 Presence of other vascular implants and grafts: Secondary | ICD-10-CM

## 2016-05-26 DIAGNOSIS — G4700 Insomnia, unspecified: Secondary | ICD-10-CM | POA: Diagnosis not present

## 2016-05-26 DIAGNOSIS — Z79899 Other long term (current) drug therapy: Secondary | ICD-10-CM | POA: Diagnosis not present

## 2016-05-26 DIAGNOSIS — Z87891 Personal history of nicotine dependence: Secondary | ICD-10-CM | POA: Diagnosis not present

## 2016-05-26 DIAGNOSIS — R110 Nausea: Secondary | ICD-10-CM | POA: Diagnosis not present

## 2016-05-26 DIAGNOSIS — M1990 Unspecified osteoarthritis, unspecified site: Secondary | ICD-10-CM | POA: Diagnosis not present

## 2016-05-26 DIAGNOSIS — R200 Anesthesia of skin: Secondary | ICD-10-CM | POA: Diagnosis not present

## 2016-05-26 DIAGNOSIS — R05 Cough: Secondary | ICD-10-CM | POA: Diagnosis not present

## 2016-05-26 DIAGNOSIS — M25511 Pain in right shoulder: Secondary | ICD-10-CM | POA: Diagnosis not present

## 2016-05-26 DIAGNOSIS — K5900 Constipation, unspecified: Secondary | ICD-10-CM | POA: Diagnosis not present

## 2016-05-26 MED ORDER — HEPARIN SOD (PORK) LOCK FLUSH 100 UNIT/ML IV SOLN
500.0000 [IU] | Freq: Once | INTRAVENOUS | Status: AC
  Administered 2016-05-26: 500 [IU] via INTRAVENOUS

## 2016-05-26 MED ORDER — SODIUM CHLORIDE 0.9% FLUSH
10.0000 mL | INTRAVENOUS | Status: AC | PRN
  Administered 2016-05-26: 10 mL via INTRAVENOUS
  Filled 2016-05-26: qty 10

## 2016-06-11 ENCOUNTER — Ambulatory Visit (INDEPENDENT_AMBULATORY_CARE_PROVIDER_SITE_OTHER): Payer: Medicare Other | Admitting: Internal Medicine

## 2016-06-11 ENCOUNTER — Encounter: Payer: Self-pay | Admitting: Internal Medicine

## 2016-06-11 VITALS — BP 110/70 | HR 90 | Temp 98.2°F | Resp 18 | Wt 191.0 lb

## 2016-06-11 DIAGNOSIS — R05 Cough: Secondary | ICD-10-CM

## 2016-06-11 DIAGNOSIS — R059 Cough, unspecified: Secondary | ICD-10-CM

## 2016-06-11 MED ORDER — HYDROCODONE-HOMATROPINE 5-1.5 MG/5ML PO SYRP: ORAL_SOLUTION | ORAL | Status: DC

## 2016-06-11 MED ORDER — ALBUTEROL SULFATE HFA 108 (90 BASE) MCG/ACT IN AERS: 2.0000 | INHALATION_SPRAY | Freq: Four times a day (QID) | RESPIRATORY_TRACT | Status: AC | PRN

## 2016-06-11 NOTE — Patient Instructions (Signed)
Try the albuterol inhaler at bedtime and as needed during the day. Use the spacer as we discussed (empty toilet paper roll). If you are not better within a week or so, let me know and we will try a different type of inhaler

## 2016-06-11 NOTE — Assessment & Plan Note (Signed)
Sounds like bronchospasm but may have some degree of inflammation (all since his RT). Will try albuterol inhaler If that isn't effective--will try steroid inhaler

## 2016-06-11 NOTE — Progress Notes (Signed)
Pre visit review using our clinic review tool, if applicable. No additional management support is needed unless otherwise documented below in the visit note. 

## 2016-06-11 NOTE — Progress Notes (Signed)
Subjective:    Patient ID: Christopher Huber, male    DOB: Sep 07, 1946, 70 y.o.   MRN: [id number]  HPI Here due to persistent cough  Has some hoarseness and dry cough Feels "tight" in his chest Gets SOB No sputum Does get worse with activity--like mowing the yard Bad at night especially--cough med really helped  Current Outpatient Prescriptions on File Prior to Visit  Medication Sig Dispense Refill  . Ascorbic Acid (VITAMIN C) 1000 MG tablet Take 1,000 mg by mouth daily.    . Cholecalciferol (VITAMIN D) 2000 UNITS CAPS Take 1 capsule by mouth daily.    Marland Kitchen GENERLAC 10 GM/15ML SOLN Reported on 03/05/2016  1  . lidocaine-prilocaine (EMLA) cream Apply to affected area once 30 g 3  . magnesium oxide (MAG-OX) 400 MG tablet Take 400 mg by mouth daily.    . Multiple Vitamin (MULTIVITAMIN WITH MINERALS) TABS Take 1 tablet by mouth daily.    . ranitidine (ZANTAC) 150 MG tablet TAKE 1 TABLET BY MOUTH TWICE A DAY 60 tablet 11  . traMADol (ULTRAM) 50 MG tablet Take 50 mg by mouth every 6 (six) hours as needed.    . vitamin E 400 UNIT capsule Take 400 Units by mouth daily.    Marland Kitchen HYDROcodone-homatropine (HYCODAN) 5-1.5 MG/5ML syrup 1 tsp po at night before bed prn cough (Patient not taking: Reported on 06/11/2016) 180 mL 0   Current Facility-Administered Medications on File Prior to Visit  Medication Dose Route Frequency Provider Last Rate Last Dose  . sodium chloride flush (NS) 0.9 % injection 10 mL  10 mL Intravenous PRN Lloyd Huger, MD   10 mL at 05/26/16 1056    Allergies  Allergen Reactions  . Pemetrexed Rash  . Codeine Nausea And Vomiting    Dizzy     Past Medical History  Diagnosis Date  . Arthritis     right arm  . GERD (gastroesophageal reflux disease)   . Lung cancer ([place]) 2013    Past Surgical History  Procedure Laterality Date  . Colonoscopy    . Esophagogastroduodenoscopy    . Left shoulder reconstruction  ~1965    after hunting accident    Family History    Problem Relation Age of Onset  . Heart disease Father   . Diabetes Neg Hx   . Cancer Neg Hx     Social History   Social History  . Marital Status: Married    Spouse Name: N/A  . Number of Children: 2  . Years of Education: N/A   Occupational History  . 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 Smoking status: Former Smoker    Quit date: 06/27/1997  . Smokeless tobacco: Never Used  . Alcohol Use: No  . Drug Use: No  . Sexual Activity: Not on file   Other Topics Concern  . Not on file   Social History Narrative   2 daughters      Has living will   Wife is health care POA   Would accept resuscitation at this point   Would consider tube feedings if prognosis is unclear   Review of Systems No fever Appetite is good Weight is stable No history of asthma    Objective:   Physical Exam  Constitutional: He appears well-developed and well-nourished. No distress.  Cardiovascular: Normal rate, regular rhythm and normal heart sounds.  Exam reveals no gallop.   No murmur heard.  Pulmonary/Chest: Effort normal. No respiratory distress. He has no rales.  Slightly decreased breath sounds Mildly prolonged expiratory phase and moderate expiratory wheezes  Musculoskeletal: He exhibits no edema.          Assessment & Plan:

## 2016-06-24 ENCOUNTER — Ambulatory Visit (INDEPENDENT_AMBULATORY_CARE_PROVIDER_SITE_OTHER): Payer: Medicare Other | Admitting: Internal Medicine

## 2016-06-24 ENCOUNTER — Encounter: Payer: Self-pay | Admitting: Internal Medicine

## 2016-06-24 VITALS — BP 104/64 | HR 86 | Temp 98.2°F | Ht 69.0 in | Wt 189.8 lb

## 2016-06-24 DIAGNOSIS — R062 Wheezing: Secondary | ICD-10-CM | POA: Diagnosis not present

## 2016-06-24 NOTE — Progress Notes (Signed)
Subjective:    Patient ID: Christopher Huber, male    DOB: [birth year], 70 y.o.   MRN: [id number]  HPI Here for follow up of cough  Doing better at times Feels the heat may be worsening things though Has the proair inhaler--this helps. Will use 3-4 times a day at times Mostly a daytime phenomenon Gets SOB-- with activity. 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 albuterol (PROVENTIL HFA;VENTOLIN HFA) 108 (90 Base) MCG/ACT inhaler Inhale 2 puffs into the lungs every 6 (six) hours as needed for wheezing or shortness of breath. 1 Inhaler 3  . Ascorbic Acid (VITAMIN C) 1000 MG tablet Take 1,000 mg by mouth daily.    . Cholecalciferol (VITAMIN D) 2000 UNITS CAPS Take 1 capsule by mouth daily.    Marland Kitchen GENERLAC 10 GM/15ML SOLN Reported on 03/05/2016  1  . HYDROcodone-homatropine (HYCODAN) 5-1.5 MG/5ML syrup 1 tsp po at night before bed prn cough 240 mL 0  . lidocaine-prilocaine (EMLA) cream Apply to affected area once 30 g 3  . magnesium oxide (MAG-OX) 400 MG tablet Take 400 mg by mouth daily.    . Multiple Vitamin (MULTIVITAMIN WITH MINERALS) TABS Take 1 tablet by mouth daily.    . ranitidine (ZANTAC) 150 MG tablet TAKE 1 TABLET BY MOUTH TWICE A DAY 60 tablet 11  . traMADol (ULTRAM) 50 MG tablet Take 50 mg by mouth every 6 (six) hours as needed.    . vitamin E 400 UNIT capsule Take 400 Units by mouth daily.     Current Facility-Administered Medications on File Prior to Visit  Medication Dose Route Frequency Provider Last Rate Last Dose  . sodium chloride flush (NS) 0.9 % injection 10 mL  10 mL Intravenous PRN Lloyd Huger, MD   10 mL at 05/26/16 1056    Allergies  Allergen Reactions  . Pemetrexed Rash  . Codeine Nausea And Vomiting    Dizzy     Past Medical History  Diagnosis Date  . Arthritis     right arm  . GERD (gastroesophageal reflux disease)   . Lung cancer ([place]) 2013    Past Surgical History  Procedure Laterality Date  . Colonoscopy    . Esophagogastroduodenoscopy    . Left shoulder reconstruction  ~1965    after hunting accident    Family History  Problem Relation Age of Onset  . Heart disease Father   . Diabetes Neg Hx   . Cancer Neg Hx     Social History   Social History  . Marital Status: Married    Spouse Name: N/A  . Number of Children: 2  . Years of Education: N/A   Occupational History  . 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 Smoking status: Former Smoker    Quit date: 06/27/1997  . Smokeless tobacco: Never Used  . Alcohol Use: No  . Drug Use: No  . Sexual Activity: Not on file   Other Topics Concern  . Not on file   Social History Narrative   2 daughters      Has living will   Wife is health care POA   Would accept resuscitation at this point  Would consider tube feedings if prognosis is unclear   Review of Systems  Appetite is good Weight stable     Objective:   Physical Exam  Constitutional: He appears well-developed and well-nourished. No distress.  Neck: Normal range of motion. Neck supple.  Cardiovascular: Normal rate, regular rhythm and normal heart sounds.  Exam reveals no gallop.   No murmur heard. Pulmonary/Chest: Effort normal. No respiratory distress. He has no rales.  Mild prolonged expiratory phase and mild exp wheezing  Lymphadenopathy:    He has no cervical adenopathy.          Assessment & Plan:

## 2016-06-24 NOTE — Assessment & Plan Note (Signed)
Cough better with the albuterol but now with more prominent wheezing Persistent SOB --especially outside History of allergies--could consider montelukast-- but problem seems linked with his chest RT for the cancer Will set up further eval with pulmonary

## 2016-06-24 NOTE — Progress Notes (Signed)
Pre visit review using our clinic review tool, if applicable. No additional management support is needed unless otherwise documented below in the visit note. 

## 2016-07-07 ENCOUNTER — Inpatient Hospital Stay: Payer: Medicare Other | Attending: Oncology

## 2016-07-07 ENCOUNTER — Encounter: Payer: Self-pay | Admitting: Internal Medicine

## 2016-07-07 ENCOUNTER — Ambulatory Visit (INDEPENDENT_AMBULATORY_CARE_PROVIDER_SITE_OTHER): Payer: Medicare Other | Admitting: Internal Medicine

## 2016-07-07 VITALS — BP 110/60 | HR 89 | Ht 69.0 in | Wt 188.0 lb

## 2016-07-07 DIAGNOSIS — Z452 Encounter for adjustment and management of vascular access device: Secondary | ICD-10-CM | POA: Insufficient documentation

## 2016-07-07 DIAGNOSIS — Z85118 Personal history of other malignant neoplasm of bronchus and lung: Secondary | ICD-10-CM | POA: Insufficient documentation

## 2016-07-07 DIAGNOSIS — Z9221 Personal history of antineoplastic chemotherapy: Secondary | ICD-10-CM | POA: Insufficient documentation

## 2016-07-07 DIAGNOSIS — C801 Malignant (primary) neoplasm, unspecified: Secondary | ICD-10-CM

## 2016-07-07 DIAGNOSIS — R0602 Shortness of breath: Secondary | ICD-10-CM

## 2016-07-07 DIAGNOSIS — R061 Stridor: Secondary | ICD-10-CM | POA: Diagnosis not present

## 2016-07-07 MED ORDER — HEPARIN SOD (PORK) LOCK FLUSH 100 UNIT/ML IV SOLN
500.0000 [IU] | Freq: Once | INTRAVENOUS | Status: AC
  Administered 2016-07-07: 500 [IU] via INTRAVENOUS

## 2016-07-07 MED ORDER — HEPARIN SOD (PORK) LOCK FLUSH 100 UNIT/ML IV SOLN
INTRAVENOUS | Status: AC
  Filled 2016-07-07: qty 5

## 2016-07-07 MED ORDER — FLUTICASONE-SALMETEROL 115-21 MCG/ACT IN AERO: 2.0000 | INHALATION_SPRAY | Freq: Two times a day (BID) | RESPIRATORY_TRACT | 0 refills | Status: DC

## 2016-07-07 MED ORDER — SODIUM CHLORIDE 0.9% FLUSH
10.0000 mL | INTRAVENOUS | Status: DC | PRN
  Administered 2016-07-07: 10 mL via INTRAVENOUS
  Filled 2016-07-07: qty 10

## 2016-07-07 NOTE — Progress Notes (Signed)
Patient ID: Christopher Huber, male   DOB: 1946/01/31, 70 y.o.   MRN: [id number] Patient seen in the office today and instructed on use of Advair HFA.  Patient expressed understanding and demonstrated technique.

## 2016-07-07 NOTE — Progress Notes (Signed)
[facility] Consultation      Date: 07/07/2016,   MRN# [id number] Christopher Huber 09/05/1946 Code Status:  Code Status History    Date Active Date Inactive Code Status Order ID Comments User Context   06/28/2015 10:05 AM 06/29/2015  3:27 AM Full Code [id number]  Jacqulynn Cadet, MD [place] day:'@LENGTHOFSTAYDAYS'$ @ Referring MD: '@ATDPROV'$ @     PCP:      AdmissionWeight: 188 lb (85.3 kg)                 CurrentWeight: 188 lb (85.3 kg) Christopher Huber is a 70 y.o. 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 Back in November 2016 there was a 2.1 cm hypermetabolic right central lobe nodule central cavitation and hypermetabolic activity in the right hilar region consistent with metastatic disease. He was treated with salvage chemotherapy with cis-platinum and pemetrexed.  -He's had a repeat PET CT scan which shows excellent response to therapy although there are still some hypermetabolic activity noted in that area of nodule as well as right hilar region.   -Recently had a CT scan of the chest. 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 Arthritis    right arm  . GERD (gastroesophageal reflux disease)   . Lung cancer [facility]) 2013     SURGICAL HISTORY   Past Surgical History:  Procedure Laterality Date  . COLONOSCOPY    . ESOPHAGOGASTRODUODENOSCOPY    . Left shoulder reconstruction  ~1965   after hunting accident     FAMILY HISTORY   Family History  Problem Relation Age of Onset  . Heart disease Father   . Diabetes Neg Hx   . Cancer Neg Hx      SOCIAL HISTORY   Social History  Substance Use Topics  . Smoking status: Former Smoker    Packs/day: 1.00    Years: 15.00    Quit date: 06/27/1997  . Smokeless tobacco: Never Used  . Alcohol use No     MEDICATIONS    Home Medication:  Current Outpatient Rx  . Order #: [id number] Class: Normal  . Order #: [id number] Class: Historical Med  . Order #: [id number] Class: Historical Med  . Order #: [id number] Class: Historical Med  . Order #: [id number] Class: Print  . Order #: [id number] Class: Normal  . Order #: [id number] Class: Historical Med  . Order #: [id number] Class: Historical  Med  . Order #: [id number] Class: Normal  . Order #: [id number] Class: Historical Med  . Order #: [id number] Class: Historical Med    Current Medication:  Current Outpatient Prescriptions:  .  albuterol (PROVENTIL HFA;VENTOLIN HFA) 108 (90 Base) MCG/ACT inhaler, Inhale 2 puffs into the lungs every 6 (six) hours as needed for wheezing or shortness of breath., Disp: 1 Inhaler, Rfl: 3 .  Ascorbic Acid (VITAMIN C) 1000 MG tablet, Take 1,000 mg by mouth daily., Disp: , Rfl:  .  Cholecalciferol (VITAMIN D) 2000 UNITS CAPS, Take 1 capsule by mouth daily., Disp: , Rfl:  .  GENERLAC 10 GM/15ML SOLN, Reported on 03/05/2016, Disp: , Rfl: 1 .   HYDROcodone-homatropine (HYCODAN) 5-1.5 MG/5ML syrup, 1 tsp po at night before bed prn cough, Disp: 240 mL, Rfl: 0 .  lidocaine-prilocaine (EMLA) cream, Apply to affected area once, Disp: 30 g, Rfl: 3 .  magnesium oxide (MAG-OX) 400 MG tablet, Take 400 mg by mouth daily., Disp: , Rfl:  .  Multiple Vitamin (MULTIVITAMIN WITH MINERALS) TABS, Take 1 tablet by mouth daily., Disp: , Rfl:  .  ranitidine (ZANTAC) 150 MG tablet, TAKE 1 TABLET BY MOUTH TWICE A DAY, Disp: 60 tablet, Rfl: 11 .  traMADol (ULTRAM) 50 MG tablet, Take 50 mg by mouth every 6 (six) hours as needed., Disp: , Rfl:  .  vitamin E 400 UNIT capsule, Take 400 Units by mouth daily., Disp: , Rfl:  No current facility-administered medications for this visit.   Facility-Administered Medications Ordered in Other Visits:  .  sodium chloride flush (NS) 0.9 % injection 10 mL, 10 mL, Intravenous, PRN, Lloyd Huger, MD, 10 mL at 05/26/16 1056    ALLERGIES   Pemetrexed and Codeine     REVIEW OF SYSTEMS   Review of Systems  Constitutional: Negative for chills, diaphoresis, fever, malaise/fatigue and weight loss.  HENT: Negative for congestion and hearing loss.   Eyes: Negative for blurred vision and double vision.  Respiratory: Positive for cough, shortness of breath, wheezing and stridor.   Cardiovascular: Negative for chest pain, palpitations and orthopnea.  Gastrointestinal: Negative for abdominal pain, blood in stool, constipation, diarrhea, heartburn, nausea and vomiting.  Genitourinary: Negative for dysuria and urgency.  Musculoskeletal: Negative for back pain, myalgias and neck pain.  Skin: Negative for rash.  Neurological: Negative for dizziness, tingling, tremors, weakness and headaches.  Endo/Heme/Allergies: Does not bruise/bleed easily.  Psychiatric/Behavioral: Negative for depression, substance abuse and suicidal ideas.  All other systems reviewed and are negative.    VS: BP 110/60 (BP Location: Left Arm, Cuff  Size: Normal)   Pulse 89   Ht '5\' 9"'$  (1.753 m)   Wt 188 lb (85.3 kg)   SpO2 95%   BMI 27.76 kg/m      PHYSICAL EXAM  Physical Exam  Constitutional: He is oriented to person, place, and time. He appears well-developed and well-nourished. No distress.  HENT:  Head: Normocephalic and atraumatic.  Mouth/Throat: No oropharyngeal exudate.  Eyes: EOM are normal. Pupils are equal, round, and reactive to light. No scleral icterus.  Neck: Normal range of motion. Neck supple.  Cardiovascular: Normal rate, regular rhythm and normal heart sounds.   No murmur heard. Pulmonary/Chest: Stridor present. No respiratory distress. He has no wheezes.  Abdominal: Soft. Bowel sounds are normal.  Musculoskeletal: Normal range of motion. He exhibits no edema.  Neurological: He is alert and oriented to person, place, and time. No cranial nerve deficit.  Skin: Skin is warm. He is not diaphoretic.  Psychiatric: He has a normal mood and affect.          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  The Patient requires high complexity decision making for assessment and support, frequent  evaluation and titration of therapies, application of advanced monitoring technologies and extensive interpretation of multiple databases.    Patient/Family are satisfied with Plan of action and management. All questions answered  Corrin Parker, M.D.  [facility]  Medical Director [place] Director [facility] Cardio-Pulmonary Department

## 2016-07-07 NOTE — Addendum Note (Signed)
Addended by: Maryanna Shape A on: 07/07/2016 11:36 AM   Modules accepted: Orders

## 2016-07-07 NOTE — Patient Instructions (Signed)
Start advair -use as directed Albuterol as needed  ENT referral for vocal cord dysfunction   Shortness of Breath Shortness of breath means you have trouble breathing. It could also mean that you have a medical problem. You should get immediate medical care for shortness of breath. CAUSES   Not enough oxygen in the air such as with high altitudes or a smoke-filled room.  Certain lung diseases, infections, or problems.  Heart disease or conditions, such as angina or heart failure.  Low red blood cells (anemia).  Poor physical fitness, which can cause shortness of breath when you exercise.  Chest or back injuries or stiffness.  Being overweight.  Smoking.  Anxiety, which can make you feel like you are not getting enough air. DIAGNOSIS  Serious medical problems can often be found during your physical exam. Tests may also be done to determine why you are having shortness of breath. Tests may include:  Chest X-rays.  Lung function tests.  Blood tests.  An electrocardiogram (ECG).  An ambulatory electrocardiogram. An ambulatory ECG records your heartbeat patterns over a 24-hour period.  Exercise testing.  A transthoracic echocardiogram (TTE). During echocardiography, sound waves are used to evaluate how blood flows through your heart.  A transesophageal echocardiogram (TEE).  Imaging scans. Your health care provider may not be able to find a cause for your shortness of breath after your exam. In this case, it is important to have a follow-up exam with your health care provider as directed.  TREATMENT  Treatment for shortness of breath depends on the cause of your symptoms and can vary greatly. HOME CARE INSTRUCTIONS   Do not smoke. Smoking is a common cause of shortness of breath. If you smoke, ask for help to quit.  Avoid being around chemicals or things that may bother your breathing, such as paint fumes and dust.  Rest as needed. Slowly resume your usual  activities.  If medicines were prescribed, take them as directed for the full length of time directed. This includes oxygen and any inhaled medicines.  Keep all follow-up appointments as directed by your health care provider. SEEK MEDICAL CARE IF:   Your condition does not improve in the time expected.  You have a hard time doing your normal activities even with rest.  You have any new symptoms. SEEK IMMEDIATE MEDICAL CARE IF:   Your shortness of breath gets worse.  You feel light-headed, faint, or develop a cough not controlled with medicines.  You start coughing up blood.  You have pain with breathing.  You have chest pain or pain in your arms, shoulders, or abdomen.  You have a fever.  You are unable to walk up stairs or exercise the way you normally do. MAKE SURE YOU:  Understand these instructions.  Will watch your condition.  Will get help right away if you are not doing well or get worse.   This information is not intended to replace advice given to you by your health care provider. Make sure you discuss any questions you have with your health care provider.   Document Released: 08/19/2001 Document Revised: 11/29/2013 Document Reviewed: 02/09/2012 Elsevier Interactive Patient Education [employer].

## 2016-07-11 ENCOUNTER — Telehealth: Payer: Self-pay | Admitting: Internal Medicine

## 2016-07-11 ENCOUNTER — Other Ambulatory Visit: Payer: Self-pay | Admitting: *Deleted

## 2016-07-11 MED ORDER — HYDROCODONE-HOMATROPINE 5-1.5 MG/5ML PO SYRP: ORAL_SOLUTION | ORAL | 0 refills | Status: DC

## 2016-07-11 NOTE — Telephone Encounter (Signed)
Have spoke with pt and refilled cough syrup per DK. Nothing further needed.

## 2016-07-11 NOTE — Telephone Encounter (Signed)
Patient wants instructions on how to use sample inhaler.  In office now.    Also wants a refill on cough medicine.    Hycodan Syrup 5 ml po hs for cough

## 2016-07-14 ENCOUNTER — Other Ambulatory Visit: Payer: Self-pay

## 2016-07-14 MED ORDER — HYDROCODONE-HOMATROPINE 5-1.5 MG/5ML PO SYRP: ORAL_SOLUTION | ORAL | 0 refills | Status: DC

## 2016-07-14 NOTE — Telephone Encounter (Signed)
Pt is calling back, states he called [facility], and they states his cough medication has not been called in. Please call and advise.

## 2016-07-14 NOTE — Telephone Encounter (Signed)
We just filled Hycodan cough syrup on 07/11/16 (Friday) and pt is requesting new rx stating he spilled part of what was filled. We prescribed 281m at 1 tsp QHS. Please advise if we can refill or not.

## 2016-07-14 NOTE — Telephone Encounter (Signed)
Per DK, can refill for half bottle, 159m. Will call pt once ready for pickup.

## 2016-07-14 NOTE — Telephone Encounter (Signed)
Pt states he spilled some and needs another refill. Pt states if needed he can come by and pick up a written.rx.

## 2016-07-14 NOTE — Telephone Encounter (Signed)
Called pt to inform DK filled for only 19m and that per DK it needs to last due to he wont refill again soon. Pt states he will come by in morning to pick up rx. Nothing further needed.

## 2016-07-25 DIAGNOSIS — K219 Gastro-esophageal reflux disease without esophagitis: Secondary | ICD-10-CM | POA: Diagnosis not present

## 2016-07-25 DIAGNOSIS — R490 Dysphonia: Secondary | ICD-10-CM | POA: Diagnosis not present

## 2016-08-04 ENCOUNTER — Encounter: Payer: Self-pay | Admitting: Internal Medicine

## 2016-08-04 ENCOUNTER — Ambulatory Visit (INDEPENDENT_AMBULATORY_CARE_PROVIDER_SITE_OTHER): Payer: Medicare Other | Admitting: Internal Medicine

## 2016-08-04 ENCOUNTER — Telehealth: Payer: Self-pay | Admitting: Internal Medicine

## 2016-08-04 VITALS — BP 100/60 | HR 99 | Ht 69.0 in | Wt 178.2 lb

## 2016-08-04 DIAGNOSIS — R0602 Shortness of breath: Secondary | ICD-10-CM

## 2016-08-04 MED ORDER — FLUTICASONE-SALMETEROL 115-21 MCG/ACT IN AERO: 2.0000 | INHALATION_SPRAY | Freq: Two times a day (BID) | RESPIRATORY_TRACT | 12 refills | Status: DC

## 2016-08-04 MED ORDER — TIOTROPIUM BROMIDE MONOHYDRATE 18 MCG IN CAPS: 18.0000 ug | ORAL_CAPSULE | Freq: Every morning | RESPIRATORY_TRACT | 12 refills | Status: DC

## 2016-08-04 NOTE — Addendum Note (Signed)
Addended by: Maryanna Shape A on: 08/04/2016 09:17 AM   Modules accepted: Orders

## 2016-08-04 NOTE — Patient Instructions (Addendum)
1.continue advair 2.start spiriva 3.albuterol as needed 4.check PFT and 6WMt and ONO  Follow up in 2-4 weeks after tests completed  Pulmonary Fibrosis Pulmonary fibrosis is a type of lung disease that causes scarring. Over time, the scar tissue (fibrosis) builds up in the air sacs of your lungs. This makes it hard for you to breathe. Less oxygen can get into your blood. Scarring from pulmonary fibrosis gets worse over time. This damage is permanent. Having damaged lungs may make it more likely that you will have heart problems as well. CAUSES Usually, the cause of pulmonary fibrosis is not known (idiopathic pulmonary fibrosis). However, pulmonary fibrosis can be caused by:   Exposure to occupational and environmental toxins. These include asbestos, silica, and metal dusts.  Inhaling moldy hay. This can cause an allergic reaction in the lung (farmer's lung) that can lead to pulmonary fibrosis.  Inhaling toxic fumes.  Certain medicines. These include drugs used in radiation therapy or used to treat seizures, heart problems, and some infections.  Autoimmune diseases, such as rheumatoid arthritis, systemic sclerosis, and connective tissue diseases.  Sarcoidosis. In this disease, areas of inflammatory cells (granulomas) form and most often affect the lungs.  Genes. Some cases of pulmonary fibrosis may be passed down through families. RISK FACTORS You may be at a higher risk for developing pulmonary fibrosis if:  You have a family history of the disease.  You have an autoimmune disease or another condition linked to pulmonary fibrosis.  You are exposed to certain substances or fumes found in agricultural, farm, Architect, or factory work.  You take certain medicines. SIGNS AND SYMPTOMS Symptoms may include:  Difficulty breathing that gets worse with activity.  Dry, hacking cough.  Rapid, shallow breathing during exercise or while at rest.  Shortness of breath that gets worse  (dyspnea).  Bluish skin and lips.  Loss of appetite.  Loss of strength.  Weight loss and fatigue.  Rounded and enlarged fingertips (clubbing). DIAGNOSIS Your health care provider may suspect pulmonary fibrosis based on your symptoms and medical history. Diagnosis may include a physical exam. Your health care provider will check for signs that strongly suggest that you have pulmonary fibrosis, such as:  Blue skin around your fingernails or mouth from reduced oxygen.  Clubbing around the ends of your fingers.  A crackling sound when you breathe. Your health care provider may also do tests to confirm the diagnosis. These may include:  Looking inside your lungs with an instrument (bronchoscopy).  Imaging studies of your lungs and heart using:  X-rays.  CT scan.  Sound waves (echocardiogram).  Tests to measure how well you are breathing (pulmonary function tests).  Exercise testing to see how well your lungs work while you are walking.  Blood tests.  A procedure to remove a small piece of lung tissue to examine in a lab (biopsy). TREATMENT There is no cure for pulmonary fibrosis. Treatments focus on managing symptoms and preventing scarring from getting worse. This can include:  Medicines.  You may take steroids to prevent permanent lung changes. Your health care provider may put you on a high dose at first, then on lower dosages for the long term.  Medicines to suppress your body's defense (immune) system. These can have serious side effects.  You may be monitored with X-rays and laboratory work.  Oxygen therapy may be helpful if oxygen in your blood is low.  Surgery. In some cases, a lung transplant is an option. HOME CARE INSTRUCTIONS  Take medicines  only as directed by your health care provider.  Keep your vaccinations up to date as recommended by your health care provider.  Do not use any tobacco products, including cigarettes, chewing tobacco, or electronic  cigarettes. If you need help quitting, ask your health care provider.  Get regular exercise, but do not overexert yourself. Ask your health care provider to suggest some activities that are safe for you to do. Walking and chair exercises can help if you have physical limitations.  Consider joining a pulmonary rehabilitation program or a support group for people with pulmonary fibrosis.  Eat small meals often so you do not get too full. Overeating can make breathing trouble worse.  Maintain a healthy weight. Lose weight if you need to.  Do breathing exercises as directed by your health care provider.  Keep all follow-up visits as directed by your health care provider. This is important. SEEK MEDICAL CARE IF:  You are not able to be as active as usual.  You have a long-lasting (chronic) cough.  You are often short of breath.  You have a fever or chills. SEEK IMMEDIATE MEDICAL CARE IF:  You have chest pain.  Your breathing is much worse.  You cannot take a deep breath.  You have blue skin around your mouth or fingers.  You have clubbing of your fingers.  You cough up mucus that is dark in color.  You have a lot of headaches.  You get very confused or sleepy.   This information is not intended to replace advice given to you by your health care provider. Make sure you discuss any questions you have with your health care provider.   Document Released: 02/14/2004 Document Revised: 12/15/2014 Document Reviewed: 05/04/2014 Elsevier Interactive Patient Education [employer].

## 2016-08-04 NOTE — Progress Notes (Signed)
[facility] Consultation      Date: 08/04/2016,   MRN# [id number] Christopher Huber Apr 20, 1946 Code Status:  Code Status History    Date Active Date Inactive Code Status Order ID Comments User Context   06/28/2015 10:05 AM 06/29/2015  3:27 AM Full Code [id number]  Jacqulynn Cadet, MD [place] day:'@LENGTHOFSTAYDAYS'$ @ Referring MD: '@ATDPROV'$ @     PCP:      Admission                  Current  Dia Christopher Huber is a 70 y.o. 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 Back in November 2016 there was a 2.1 cm hypermetabolic right central lobe nodule central cavitation and hypermetabolic activity in the right hilar region consistent with metastatic disease.   He was treated with salvage chemotherapy with cis-platinum and pemetrexed.  -He's had a repeat PET CT scan which shows excellent response to therapy although there are still some hypermetabolic activity noted in that area of nodule as well as right hilar region.   -Recently had a CT scan of the chest. 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 with no convincing evidence of residual or recurrent disease  Was started on Advair and albuterol inhaler as needed and seems to have helped slighly Still remains SOB with DOE with intermittent wheezing Patient does NOT want prednisone at this time Will try another inhaler Still with hoarsness ENT saw patient , patient reports scar tissues and evidence of GERD   Patient otherwise feels well, no fevers, chills,  NVD Patient has no signs of infection at this time     Current Outpatient Prescriptions:  .  albuterol (PROVENTIL HFA;VENTOLIN HFA) 108 (90 Base) MCG/ACT inhaler, Inhale 2 puffs into the lungs every 6 (six) hours as needed for wheezing or shortness of breath., Disp: 1 Inhaler, Rfl: 3 .  Ascorbic Acid (VITAMIN C) 1000 MG tablet, Take 1,000 mg by mouth daily., Disp: , Rfl:  .  Cholecalciferol (VITAMIN D) 2000 UNITS CAPS, Take 1 capsule by mouth daily., Disp: , Rfl:  .  fluticasone-salmeterol (ADVAIR HFA) 115-21 MCG/ACT inhaler, Inhale 2 puffs into the lungs 2 (two) times daily., Disp: 1 Inhaler, Rfl: 0 .  GENERLAC 10 GM/15ML SOLN, Reported on 03/05/2016, Disp: , Rfl: 1 .  HYDROcodone-homatropine (HYCODAN) 5-1.5 MG/5ML syrup, 1 tsp po at night before bed prn cough, Disp: 120 mL, Rfl: 0 .  lidocaine-prilocaine (EMLA) cream, Apply to affected area once, Disp: 30 g, Rfl: 3 .  magnesium oxide (MAG-OX) 400 MG tablet, Take 400 mg by mouth daily., Disp: , Rfl:  .  Multiple Vitamin (MULTIVITAMIN WITH MINERALS) TABS, Take 1 tablet by mouth daily., Disp: , Rfl:  .  ranitidine (ZANTAC) 150 MG tablet, TAKE 1 TABLET BY MOUTH TWICE A DAY, Disp: 60 tablet, Rfl: 11 .  traMADol (ULTRAM) 50 MG tablet, Take 50 mg by mouth every 6 (six) hours as needed., Disp: , Rfl:  .  vitamin E 400 UNIT capsule, Take 400 Units  by mouth daily., Disp: , Rfl:  No current facility-administered medications for this visit.   Facility-Administered Medications Ordered in Other Visits:  .  sodium chloride flush (NS) 0.9 % injection 10 mL, 10 mL, Intravenous, PRN, Lloyd Huger, MD, 10 mL at 05/26/16 1056    ALLERGIES   Pemetrexed and Codeine     REVIEW OF SYSTEMS   Review of Systems  Constitutional: Negative for chills, diaphoresis, fever, malaise/fatigue and weight loss.  HENT: Negative for congestion and hearing loss.   Respiratory: Positive for cough, shortness of breath, wheezing and stridor. Negative for hemoptysis  and sputum production.   Cardiovascular: Negative for chest pain, palpitations, orthopnea and leg swelling.  Gastrointestinal: Negative for abdominal pain, heartburn, nausea and vomiting.  Skin: Negative for rash.  Neurological: Positive for dizziness. Negative for weakness and headaches.  Endo/Heme/Allergies: Does not bruise/bleed easily.  All other systems reviewed and are negative.    VS: There were no vitals taken for this visit.     PHYSICAL EXAM  Physical Exam  Constitutional: He is oriented to person, place, and time. He appears well-developed and well-nourished. No distress.  HENT:  Head: Normocephalic and atraumatic.  Mouth/Throat: No oropharyngeal exudate.  Neck: Normal range of motion. Neck supple.  Cardiovascular: Normal rate, regular rhythm and normal heart sounds.   No murmur heard. Pulmonary/Chest: Effort normal. Stridor present. No respiratory distress. He has no wheezes. He has rales.  Musculoskeletal: Normal range of motion. He exhibits no edema.  Neurological: He is alert and oriented to person, place, and time. No cranial nerve deficit.  Skin: Skin is warm. He is not diaphoretic.  Psychiatric: He has a normal mood and affect.          IMAGING   CT chest 04/2016 Fibrotic changes in apices noted, seems that previous lung  cancer rt lung decreased in size, new Rt apical opacity S/p abx and prednisone therapy    ASSESSMENT/PLAN   70 yo white male with stage 3 Adenocarcinoma with s/p RXT and chemo with previous environmental exposure and recent radiation exposure with with underlying radiation fibrosis and probable underlying COPD with signs of mild  inspiratory stridor  1.continue adviar 2.start spiriva 3.albuterol as needed 4.will need PFT 6WMT and ONO  Follow up in 4 weeks after tests are completed If symptoms persist, I have explained that he may need Prednisone therapy for fibrosis/wheezing. He does NOT want prednisone at this time.    The  Patient requires high complexity decision making for assessment and support, frequent evaluation and titration of therapies, application of advanced monitoring technologies and extensive interpretation of multiple databases.    Patient satisfied with Plan of action and management. All questions answered  Corrin Parker, M.D.  [facility]  Medical Director [place] Director [facility]

## 2016-08-04 NOTE — Telephone Encounter (Signed)
error 

## 2016-08-04 NOTE — Telephone Encounter (Signed)
[facility] did not get the 2 inhaler rx that were submitted after todays office visit. Please resubmit or call Pharmacy.

## 2016-08-05 ENCOUNTER — Encounter: Payer: Self-pay | Admitting: Internal Medicine

## 2016-08-05 DIAGNOSIS — R0602 Shortness of breath: Secondary | ICD-10-CM | POA: Diagnosis not present

## 2016-08-05 MED ORDER — TIOTROPIUM BROMIDE MONOHYDRATE 18 MCG IN CAPS: 18.0000 ug | ORAL_CAPSULE | Freq: Every day | RESPIRATORY_TRACT | 6 refills | Status: DC

## 2016-08-05 NOTE — Telephone Encounter (Signed)
rx sent to preferred pharmacy. Pt aware & voiced understanding. Nothing further needed.

## 2016-08-07 ENCOUNTER — Other Ambulatory Visit: Payer: Self-pay | Admitting: *Deleted

## 2016-08-07 MED ORDER — FLUTICASONE-SALMETEROL 115-21 MCG/ACT IN AERO: 2.0000 | INHALATION_SPRAY | Freq: Two times a day (BID) | RESPIRATORY_TRACT | 12 refills | Status: DC

## 2016-08-08 ENCOUNTER — Telehealth: Payer: Self-pay | Admitting: *Deleted

## 2016-08-08 ENCOUNTER — Telehealth: Payer: Self-pay | Admitting: Internal Medicine

## 2016-08-08 DIAGNOSIS — C3490 Malignant neoplasm of unspecified part of unspecified bronchus or lung: Secondary | ICD-10-CM

## 2016-08-08 NOTE — Addendum Note (Signed)
Addended by: Oscar La R on: 08/08/2016 04:55 PM   Modules accepted: Orders

## 2016-08-08 NOTE — Telephone Encounter (Signed)
Pt informed of ONO results. Order placed for O2.

## 2016-08-08 NOTE — Telephone Encounter (Signed)
Pt would like his results from PFT faxed to [phone]. This will be going to his wife, Breck Coons.

## 2016-08-08 NOTE — Telephone Encounter (Signed)
Spoke with pt and he is wanting the ONO results faxed to his wife. His PFT hasn't been done yet. Will fax per pt request.

## 2016-08-12 DIAGNOSIS — C3490 Malignant neoplasm of unspecified part of unspecified bronchus or lung: Secondary | ICD-10-CM | POA: Diagnosis not present

## 2016-08-13 ENCOUNTER — Telehealth: Payer: Self-pay | Admitting: Internal Medicine

## 2016-08-13 MED ORDER — HYDROCODONE-HOMATROPINE 5-1.5 MG/5ML PO SYRP: ORAL_SOLUTION | ORAL | 0 refills | Status: DC

## 2016-08-13 NOTE — Telephone Encounter (Signed)
Per DK ok to refill cough syrup. Called pt and informed ready for pickup.

## 2016-08-13 NOTE — Telephone Encounter (Signed)
Patient wants to know if he can have a written rx to take to [address] for cough medicine hydrocodone .  Please call.  He says he can come to office for pick up as [facility] never gets electronic rx acurately

## 2016-08-19 ENCOUNTER — Telehealth: Payer: Self-pay | Admitting: Internal Medicine

## 2016-08-19 DIAGNOSIS — R0603 Acute respiratory distress: Secondary | ICD-10-CM

## 2016-08-19 DIAGNOSIS — R061 Stridor: Secondary | ICD-10-CM

## 2016-08-19 MED ORDER — IPRATROPIUM-ALBUTEROL 0.5-2.5 (3) MG/3ML IN SOLN: 3.0000 mL | RESPIRATORY_TRACT | 0 refills | Status: DC

## 2016-08-19 MED ORDER — PREDNISONE 20 MG PO TABS: 40.0000 mg | ORAL_TABLET | Freq: Every day | ORAL | 0 refills | Status: DC

## 2016-08-19 NOTE — Telephone Encounter (Signed)
Pt calling stating his breathing is getting worst. oxygen level a few moments ago is 83% Using the machine but it is not helping  Would like to know what he needs to do  Please advise.

## 2016-08-19 NOTE — Telephone Encounter (Signed)
Spoke with pt who states since being seen last is breathing has gone down hill, pt c/o increased sob, prod cough with white mucus, wheezing, chest discomfort. Pt denies any fever, sweats or chills.  Pt on 2L 02 qsh, pt states he went to the restroom and came back to the living room and checked his 02 level, pt was sating at 83. Pt currently wearing 2L 02 to get his 02 up. Pt would like recommendations.  DK please advise. Thanks.

## 2016-08-19 NOTE — Telephone Encounter (Signed)
Spoke with pt and informed we are sending prednisone to pharmacy. Per DK, pt also needs duonebs Q4hrs until seen next week. RX sent in and neb machine order placed. Nothing further needed.

## 2016-08-19 NOTE — Telephone Encounter (Signed)
Start prednisone 40 mg daily for 10 days, needs to be seen by one of [place] in 1 week or so

## 2016-08-26 ENCOUNTER — Encounter: Payer: Self-pay | Admitting: Pulmonary Disease

## 2016-08-26 ENCOUNTER — Ambulatory Visit
Admission: RE | Admit: 2016-08-26 | Discharge: 2016-08-26 | Disposition: A | Discharge status: Home / Self Care | Payer: Medicare Other | Source: Ambulatory Visit | Attending: Pulmonary Disease | Admitting: Pulmonary Disease

## 2016-08-26 ENCOUNTER — Other Ambulatory Visit: Payer: Self-pay | Admitting: *Deleted

## 2016-08-26 ENCOUNTER — Ambulatory Visit (INDEPENDENT_AMBULATORY_CARE_PROVIDER_SITE_OTHER): Payer: Medicare Other | Admitting: Pulmonary Disease

## 2016-08-26 VITALS — BP 118/70 | HR 95 | Ht 69.0 in | Wt 178.0 lb

## 2016-08-26 DIAGNOSIS — R061 Stridor: Secondary | ICD-10-CM

## 2016-08-26 DIAGNOSIS — N1411 Contrast-induced nephropathy: Secondary | ICD-10-CM

## 2016-08-26 DIAGNOSIS — R05 Cough: Secondary | ICD-10-CM | POA: Diagnosis not present

## 2016-08-26 DIAGNOSIS — T508X5A Adverse effect of diagnostic agents, initial encounter: Principal | ICD-10-CM

## 2016-08-26 DIAGNOSIS — R0600 Dyspnea, unspecified: Secondary | ICD-10-CM | POA: Diagnosis not present

## 2016-08-26 DIAGNOSIS — C3490 Malignant neoplasm of unspecified part of unspecified bronchus or lung: Secondary | ICD-10-CM

## 2016-08-26 DIAGNOSIS — R918 Other nonspecific abnormal finding of lung field: Secondary | ICD-10-CM | POA: Insufficient documentation

## 2016-08-26 DIAGNOSIS — Z85118 Personal history of other malignant neoplasm of bronchus and lung: Secondary | ICD-10-CM | POA: Insufficient documentation

## 2016-08-26 DIAGNOSIS — N141 Nephropathy induced by other drugs, medicaments and biological substances: Secondary | ICD-10-CM

## 2016-08-26 NOTE — Progress Notes (Signed)
bmet  

## 2016-08-26 NOTE — Progress Notes (Signed)
PROBLEMS: History of lung cancer with recurrence - s/p chemo and XRT Inspiratory stridor Increasing dyspnea  INTERVAL HISTORY: Last seen by Dr Mortimer Fries 08/04/16 - Spiriva added Prednisone called in 08/19/16 for increasing dyspnea  SUBJ: Pt seen as an acute visit for increasing dyspnea. He was started on prednisone a week ago. He is marginally better. Still notes cough with scant clear mucus. Still concerned about hoarseness. Denies CP, fever, purulent sputum, hemoptysis, LE edema and calf tenderness  OBJ: Vitals:   08/26/16 0905 08/26/16 0906  BP:  118/70  Pulse:  95  SpO2:  93%  Weight: 178 lb (80.7 kg)   Height: '5\' 9"'$  (1.753 m)     Gen: WDWN in NAD, hoarse voice quality, inspiratory stridor - intermittent HEENT: All WNL Neck: NO LAN, no JVD noted Lungs: full BS, normal percussion note, mixed monophonic and polyphonic wheezes on R, no wheezes on L Cardiovascular: Reg rate, normal rhythm, no M noted Abdomen: Soft, NT +BS Ext: no C/C/E Neuro: CNs intact, motor/sens grossly intact Skin: No lesions noted   DATA: CXR 08/26/16: bilateral UL retraction (old, post-XRT changes), increased RUL interstitial opacity  IMPRESSION: History of lung cancer - Plan: DG Chest 2 View  Dyspnea  Stridor  I have reviewed Dr Sonny Masters (ENT) report. The laryngoscopic exam was suboptimal due to strong gag reflex. The cords moved symmetrically and on the limited exam, there was no obvious subglottic stenosis  I am concerned about the persistent stridor localized to the R side, the increased dyspnea and the increased RUL infiltrate.   PLAN: 1) CT chest - this had already been ordered by Dr Grayland Ormond. I will bump up the timing of this 2) Bronchoscopy next week for airway exam. If there is no endobronchial disease, I will obtain washings, brushings and possibly transbronchial biopsy from the RUL  I have discussed the procedure of bronchoscopy, its indication, risks and alternatives. He agrees to  proceed. This has been scheduled for 09/03/16  3) ROV 2-3 weeks   Merton Border, MD PCCM service Mobile [phone] Pager [phone] 08/26/2016

## 2016-08-27 ENCOUNTER — Telehealth: Payer: Self-pay

## 2016-08-27 ENCOUNTER — Ambulatory Visit
Admission: RE | Admit: 2016-08-27 | Discharge: 2016-08-27 | Disposition: A | Discharge status: Home / Self Care | Payer: Medicare Other | Source: Ambulatory Visit | Attending: Pulmonary Disease | Admitting: Pulmonary Disease

## 2016-08-27 DIAGNOSIS — I2510 Atherosclerotic heart disease of native coronary artery without angina pectoris: Secondary | ICD-10-CM | POA: Insufficient documentation

## 2016-08-27 DIAGNOSIS — R061 Stridor: Secondary | ICD-10-CM | POA: Diagnosis not present

## 2016-08-27 DIAGNOSIS — J90 Pleural effusion, not elsewhere classified: Secondary | ICD-10-CM | POA: Insufficient documentation

## 2016-08-27 DIAGNOSIS — I700 Atherosclerosis of aorta: Secondary | ICD-10-CM | POA: Diagnosis not present

## 2016-08-27 DIAGNOSIS — C3490 Malignant neoplasm of unspecified part of unspecified bronchus or lung: Secondary | ICD-10-CM

## 2016-08-27 DIAGNOSIS — M47814 Spondylosis without myelopathy or radiculopathy, thoracic region: Secondary | ICD-10-CM | POA: Diagnosis not present

## 2016-08-27 DIAGNOSIS — Z85118 Personal history of other malignant neoplasm of bronchus and lung: Secondary | ICD-10-CM | POA: Insufficient documentation

## 2016-08-27 DIAGNOSIS — C3412 Malignant neoplasm of upper lobe, left bronchus or lung: Secondary | ICD-10-CM | POA: Diagnosis not present

## 2016-08-27 DIAGNOSIS — I313 Pericardial effusion (noninflammatory): Secondary | ICD-10-CM | POA: Insufficient documentation

## 2016-08-27 MED ORDER — IOPAMIDOL (ISOVUE-300) INJECTION 61%
75.0000 mL | Freq: Once | INTRAVENOUS | Status: AC | PRN
  Administered 2016-08-27: 75 mL via INTRAVENOUS

## 2016-08-27 NOTE — Telephone Encounter (Signed)
Received a call report from [place] from [facility], pt CT showed moderate pericardial effusion is increased.  DS please advise. Thanks

## 2016-08-28 ENCOUNTER — Telehealth: Payer: Self-pay | Admitting: Pulmonary Disease

## 2016-08-31 NOTE — Progress Notes (Signed)
Christopher Huber  [phone] [phone]) [phone]  ID: Christopher Huber OB: April 06, 1946  MR#: [id number]  HKV#:[id number]  Patient Care Team: Venia Carbon, MD as PCP - General (Internal Medicine)  CHIEF COMPLAINT: Recurrent bilateral adenocarcinoma lung cancer.  INTERVAL HISTORY: Patient returns to clinic today as an add-on to discuss his CT scan results and bronchoscopy procedure in 2 days. Over the past several months he has developed increased hoarseness of voice, shortness of breath, and stridor. Despite several rounds of antibiotics and steroids, as his symptoms were not improving. CT scan revealed likely recurrence of his malignancy. He has a bronchoscopy scheduled for 2 days. He otherwise feels well. He has no neurologic complaints. He denies any fevers. He has a fair appetite, but denies weight loss. He continues to have right shoulder pain. He does not complain of constipation today. He has difficulty sleeping. He denies any nausea, vomiting, constipation, or diarrhea. He has no urinary complaints. Patient offers no further specific complaints today.   REVIEW OF SYSTEMS:   Review of Systems  Constitutional: Negative for fever, malaise/fatigue and weight loss.  Respiratory: Positive for cough and stridor. Negative for hemoptysis, sputum production and shortness of breath.   Cardiovascular: Negative.  Negative for chest pain.  Gastrointestinal: Negative for constipation, nausea and vomiting.  Musculoskeletal: Positive for back pain and joint pain.  Skin: Negative for rash.  Neurological: Positive for tingling. Negative for weakness.  Psychiatric/Behavioral: The patient is nervous/anxious.     As per HPI. Otherwise, a complete review of systems is negative.  PAST MEDICAL HISTORY: Past Medical History:  Diagnosis Date  . Arthritis    right arm  . GERD (gastroesophageal reflux disease)   . Lung cancer ([place]) 2013   Rad tx's and chemo.     PAST  SURGICAL HISTORY: Past Surgical History:  Procedure Laterality Date  . COLONOSCOPY    . ESOPHAGOGASTRODUODENOSCOPY    . Left shoulder reconstruction  ~1965   after hunting accident    FAMILY HISTORY Family History  Problem Relation Age of Onset  . Heart disease Father   . Diabetes Neg Hx   . Cancer Neg Hx        ADVANCED DIRECTIVES:    HEALTH MAINTENANCE: Social History  Substance Use Topics  . Smoking status: Former Smoker    Packs/day: 1.00    Years: 15.00    Quit date: 06/27/1997  . Smokeless tobacco: Never Used  . Alcohol use No     Colonoscopy:  PAP:  Bone density:  Lipid panel:  Allergies  Allergen Reactions  . Pemetrexed Rash  . Codeine Nausea And Vomiting    Dizzy     Current Outpatient Prescriptions  Medication Sig Dispense Refill  . albuterol (PROVENTIL HFA;VENTOLIN HFA) 108 (90 Base) MCG/ACT inhaler Inhale 2 puffs into the lungs every 6 (six) hours as needed for wheezing or shortness of breath. 1 Inhaler 3  . Ascorbic Acid (VITAMIN C) 1000 MG tablet Take 1,000 mg by mouth daily.    . Cholecalciferol (VITAMIN D) 2000 UNITS CAPS Take 1 capsule by mouth daily.    Marland Kitchen GENERLAC 10 GM/15ML SOLN Reported on 03/05/2016  1  . HYDROcodone-homatropine (HYCODAN) 5-1.5 MG/5ML syrup 1 tsp po at night before bed prn cough 120 mL 0  . ipratropium-albuterol (DUONEB) 0.5-2.5 (3) MG/3ML SOLN Take 3 mLs by nebulization every 4 (four) hours. 360 mL 0  . magnesium oxide (MAG-OX) 400 MG tablet Take 400 mg by mouth daily.    Marland Kitchen  Multiple Vitamin (MULTIVITAMIN WITH MINERALS) TABS Take 1 tablet by mouth daily.    Marland Kitchen omeprazole (PRILOSEC) 40 MG capsule     . tiotropium (SPIRIVA) 18 MCG inhalation capsule Place 1 capsule (18 mcg total) into inhaler and inhale daily. 30 capsule 6  . traMADol (ULTRAM) 50 MG tablet Take 50 mg by mouth every 6 (six) hours as needed.    . vitamin E 400 UNIT capsule Take 400 Units by mouth daily.    . fluticasone-salmeterol (ADVAIR HFA) 115-21 MCG/ACT  inhaler Inhale 2 puffs into the lungs 2 (two) times daily. 1 Inhaler 12   No current facility-administered medications for this visit.    Facility-Administered Medications Ordered in Other Visits  Medication Dose Route Frequency Provider Last Rate Last Dose  . sodium chloride flush (NS) 0.9 % injection 10 mL  10 mL Intravenous PRN Lloyd Huger, MD   10 mL at 05/26/16 1056    OBJECTIVE: Vitals:   09/01/16 0856  BP: 110/68  Pulse: (!) 103  Resp: 18  Temp: 98.3 F (36.8 C)     Body mass index is 25.96 kg/m.    ECOG FS:1 - Symptomatic but completely ambulatory  General: Well-developed, well-nourished, no acute distress. Eyes: anicteric sclera. Lungs: Stridor, diminished right breath sounds. Heart: Regular rate and rhythm. No rubs, murmurs, or gallops. Abdomen: Soft, nontender, nondistended. No organomegaly noted, normoactive bowel sounds. Musculoskeletal: No edema, cyanosis, or clubbing. Neuro: Alert, answering all questions appropriately. Cranial nerves grossly intact. Skin: Rash has resolved. Psych: Normal affect.  LAB RESULTS:  Lab Results  Component Value Date   NA 132 (L) 02/06/2016   K 4.4 02/06/2016   CL 100 (L) 02/06/2016   CO2 26 02/06/2016   GLUCOSE 87 02/06/2016   BUN 15 02/06/2016   CREATININE 1.00 05/07/2016   CALCIUM 9.0 02/06/2016   PROT 7.0 02/06/2016   ALBUMIN 3.9 02/06/2016   AST 16 02/06/2016   ALT 11 (L) 02/06/2016   ALKPHOS 69 02/06/2016   BILITOT 0.4 02/06/2016   GFRNONAA >60 02/06/2016   GFRAA >60 02/06/2016    Lab Results  Component Value Date   WBC 4.7 03/26/2016   NEUTROABS 1.5 02/06/2016   HGB 12.2 (L) 03/26/2016   HCT 35.1 (L) 03/26/2016   MCV 88.7 03/26/2016   PLT 270 03/26/2016     STUDIES: Dg Chest 2 View  Result Date: 08/26/2016 CLINICAL DATA:  Spoke with pt who states since being seen last is breathing has gone down hill, pt c/o increased sob, prod cough with white mucus, wheezing, chest discomfort. Pt denies any  fever, sweats or chills. Pt on 2L 02 qsh, pt states he went to the restroom and came back to the living room and checked his 02 level, pt was sating at 83. Pt currently wearing 2L 02 to get his 02 up. EXAM: CHEST  2 VIEW COMPARISON:  04/23/2016 FINDINGS: There is increased opacity extending from the right hilum to the right upper lobe. There is greater retraction of the minor fissure superiorly. Pleural thickening overlying the right upper lobe is stable. Scarring in the left upper lobe is stable. There are no other new areas of lung opacity. No pleural effusion.  No pneumothorax. Cardiac silhouette is normal in size. No convincing mediastinal masses. No left hilar mass or adenopathy. Right hilum is partly obscured by the contiguous right upper lobe opacity. Right anterior chest wall Port-A-Cath is stable and well positioned. Skeletal structures show no all osteolytic or osteoblastic lesions. IMPRESSION: 1. Increased  opacity has developed extending from the right hilum into the right upper lobe. Given the history, suspect pneumonia superimposed on chronic changes of radiation induced fibrosis. Electronically Signed   By: Lajean Manes M.D.   On: 08/26/2016 10:31   Ct Chest W Contrast  Result Date: 08/27/2016 CLINICAL DATA:  Left upper lobe lung cancer diagnosed in 2014 status post radiation therapy and chemotherapy, with right upper lobe recurrence treated with radiation therapy and chemotherapy, presents with stridor and increased right upper lung opacity on recent chest radiograph. EXAM: CT CHEST WITH CONTRAST TECHNIQUE: Multidetector CT imaging of the chest was performed during intravenous contrast administration. CONTRAST:  61m ISOVUE-300 IOPAMIDOL (ISOVUE-300) INJECTION 61% COMPARISON:  08/26/2016 chest radiograph. 05/07/2016 chest CT. 02/04/2016 PET-CT. FINDINGS: Cardiovascular: Normal heart size. Moderate pericardial effusion measuring up to 13 mm in thickness anteriorly, increased from 0.7 cm. Right  internal jugular MediPort terminates at the cavoatrial junction. Left anterior descending and right coronary atherosclerosis. Atherosclerotic nonaneurysmal thoracic aorta. Normal caliber pulmonary arteries. No central pulmonary emboli. Mediastinum/Nodes: No discrete thyroid nodules. Unremarkable esophagus. No axillary adenopathy. No pathologically enlarged mediastinal or left hilar nodes. There is new infiltrative right hilar soft tissue attenuation measuring up to 1.5 cm in thickness (series 2/ image 66), increased from 0.8 cm, associated with new occlusion of the right middle lobe bronchus. Lungs/Pleura: No pneumothorax. New small right pleural effusion. No left pleural effusion. There is stable masslike fibrosis, distortion and volume loss in the central/ posterior left upper lobe. There is extensive new patchy consolidation, ground-glass opacity and reticulation throughout the central right lung predominantly involving the superior segment right lower lobe and basilar right upper lobe, most suggestive of new radiation change. These findings are superimposed on prior areas of masslike fibrosis in the right upper lobe, for example a 3.2 x 1.1 cm apical right upper lobe focus of consolidation (series 3/image 22), which previously measured 3.2 x 1.2 cm on 05/07/2016 using similar measurement technique, unchanged. No acute consolidative airspace disease or new significant pulmonary nodules in the left lung. Stable small calcified pleural plaque on the right. Upper abdomen: Hypodense 1.2 cm renal cortical lesion in the posterior upper left kidney (series 2/ image 163), previously 1.1 cm, not definitely changed. Musculoskeletal: No aggressive appearing focal osseous lesions. Mild thoracic spondylosis . IMPRESSION: 1. New extensive patchy consolidation and ground-glass attenuation in the superior segment right lower lobe and right upper lobe, most suggestive of new radiation change. This finding is superimposed on  stable prior areas of radiation fibrosis in the right upper lobe. 2. New infiltrative soft tissue attenuation throughout the right hilum, worrisome for progression of infiltrative metastatic right hilar adenopathy. This finding is associated with new occlusion of the right middle lobe bronchus, which may account for the right-sided stridor. 3. No additional sites suspicious for metastatic disease in the chest. Stable radiation fibrosis in the left upper lobe. 4. Moderate pericardial effusion is increased. New small right pleural effusion. 5. Indeterminate small renal cortical lesion in the upper left kidney, not definitely changed, cannot exclude renal cell carcinoma. 6. Aortic atherosclerosis.  Two-vessel coronary atherosclerosis. These results will be called to the ordering clinician or representative by the Radiologist Assistant, and communication documented in the PACS or zVision Dashboard. Electronically Signed   By: JIlona SorrelM.D.   On: 08/27/2016 16:12   Oncology history: Patient was initially diagnosed with left upper lobe adenocarcinoma in 2014 and received 8 cycles of concurrent weekly carboplatinum and Taxol along with XRT between February 17, 2013 and Apr 07, 2013. He did not receive consolidation treatment at that time.  Patient was then placed on maintenance Tarceva. He then had a recurrence in his right upper lobe and subsequently underwent 4 cycles of cisplatin and pemetrexed between November 14, 2015 and January 16, 2016. Pemetrexed was subsequently discontinued secondary significant rash.   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 T727mmutation was not detected.  PLAN:    1.  Recurrent bilateral adenocarcinoma lung cancer:  CT scan results reviewed independently and reported as above which are highly concerning for recurrence of disease. Proceed with bronchoscopy on September 03, 2016 and follow-up with pulmonology  next week. Return to clinic in approximately one week to discuss the results and treatment planning if necessary. Patient will likely need restaging PET scan prior to initiating any salvage treatment. 2.  Rash:  Resolved.  Secondary to pemetrexed which has been discontinued.  3.  Hypomagnesemia: Resolved. 4.  Cough: Likely secondary to recurrence, bronchoscopy as above. 5.  Nausea: Patient does not complain of this today.  Continue Zofran and Compazine as needed. 6.  Constipation: Continue stool softeners and Miralax.  Lactulose as needed.  7.  Pain: Tramadol did not help and patient cannot take narcotics given side effects. Monitor.  Approximately 30 minutes was spent in discussion of which greater than 50% was consultation.  Patient expressed understanding and was in agreement with this plan. He also understands that He can call clinic at any time with any questions, concerns, or complaints.    TLloyd Huger MD   09/01/2016 10:44 AM

## 2016-09-01 ENCOUNTER — Telehealth: Payer: Self-pay | Admitting: Pulmonary Disease

## 2016-09-01 ENCOUNTER — Inpatient Hospital Stay: Payer: Medicare Other | Attending: Oncology | Admitting: Oncology

## 2016-09-01 VITALS — BP 110/68 | HR 103 | Temp 98.3°F | Resp 18 | Wt 175.8 lb

## 2016-09-01 DIAGNOSIS — M1990 Unspecified osteoarthritis, unspecified site: Secondary | ICD-10-CM | POA: Insufficient documentation

## 2016-09-01 DIAGNOSIS — F419 Anxiety disorder, unspecified: Secondary | ICD-10-CM | POA: Diagnosis not present

## 2016-09-01 DIAGNOSIS — R490 Dysphonia: Secondary | ICD-10-CM | POA: Insufficient documentation

## 2016-09-01 DIAGNOSIS — Z9221 Personal history of antineoplastic chemotherapy: Secondary | ICD-10-CM | POA: Insufficient documentation

## 2016-09-01 DIAGNOSIS — I313 Pericardial effusion (noninflammatory): Secondary | ICD-10-CM | POA: Diagnosis not present

## 2016-09-01 DIAGNOSIS — Z79899 Other long term (current) drug therapy: Secondary | ICD-10-CM | POA: Insufficient documentation

## 2016-09-01 DIAGNOSIS — R0602 Shortness of breath: Secondary | ICD-10-CM | POA: Diagnosis not present

## 2016-09-01 DIAGNOSIS — J90 Pleural effusion, not elsewhere classified: Secondary | ICD-10-CM | POA: Insufficient documentation

## 2016-09-01 DIAGNOSIS — G479 Sleep disorder, unspecified: Secondary | ICD-10-CM | POA: Diagnosis not present

## 2016-09-01 DIAGNOSIS — C3492 Malignant neoplasm of unspecified part of left bronchus or lung: Secondary | ICD-10-CM

## 2016-09-01 DIAGNOSIS — Z923 Personal history of irradiation: Secondary | ICD-10-CM | POA: Insufficient documentation

## 2016-09-01 DIAGNOSIS — R202 Paresthesia of skin: Secondary | ICD-10-CM | POA: Insufficient documentation

## 2016-09-01 DIAGNOSIS — Z85118 Personal history of other malignant neoplasm of bronchus and lung: Secondary | ICD-10-CM | POA: Insufficient documentation

## 2016-09-01 DIAGNOSIS — R918 Other nonspecific abnormal finding of lung field: Secondary | ICD-10-CM | POA: Insufficient documentation

## 2016-09-01 DIAGNOSIS — Z87891 Personal history of nicotine dependence: Secondary | ICD-10-CM | POA: Diagnosis not present

## 2016-09-01 DIAGNOSIS — M47814 Spondylosis without myelopathy or radiculopathy, thoracic region: Secondary | ICD-10-CM | POA: Insufficient documentation

## 2016-09-01 DIAGNOSIS — K219 Gastro-esophageal reflux disease without esophagitis: Secondary | ICD-10-CM | POA: Diagnosis not present

## 2016-09-01 DIAGNOSIS — R05 Cough: Secondary | ICD-10-CM | POA: Diagnosis not present

## 2016-09-01 DIAGNOSIS — M25511 Pain in right shoulder: Secondary | ICD-10-CM

## 2016-09-01 DIAGNOSIS — C3491 Malignant neoplasm of unspecified part of right bronchus or lung: Secondary | ICD-10-CM

## 2016-09-01 DIAGNOSIS — R061 Stridor: Secondary | ICD-10-CM | POA: Diagnosis not present

## 2016-09-01 NOTE — Telephone Encounter (Signed)
Patient also dropped off handicapped placard application .  Placed in [place] for [place].

## 2016-09-01 NOTE — Telephone Encounter (Signed)
Spoke with pt and wife in regards to the portable O2. Informed once pt has [facility] we will see if it is needed and if so we will place order and send it to [facility]. Informed once handicap paper filled out will call them to pick it up.

## 2016-09-01 NOTE — Telephone Encounter (Signed)
Patient talked to Dr. Grayland Ormond about portable o2 concentration.   Please call patient and /or spouse to discuss ordering this through [facility] or however is best/lowest cost.

## 2016-09-01 NOTE — Progress Notes (Signed)
Pt states has bronch scheduled on 9/27 and has questions for Dr. Grayland Ormond regarding lung cancer recurrence. Only complaint this morning is being short of breath.

## 2016-09-03 ENCOUNTER — Ambulatory Visit: Payer: Medicare Other

## 2016-09-03 ENCOUNTER — Inpatient Hospital Stay
Admission: AD | Admit: 2016-09-03 | Discharge: 2016-09-04 | DRG: 167 | Disposition: A | Discharge status: Home / Self Care | Payer: Medicare Other | Source: Ambulatory Visit | Attending: Internal Medicine | Admitting: Internal Medicine

## 2016-09-03 ENCOUNTER — Encounter
Admission: AD | Disposition: A | Discharge status: Home / Self Care | Payer: Self-pay | Source: Ambulatory Visit | Attending: Internal Medicine

## 2016-09-03 DIAGNOSIS — J209 Acute bronchitis, unspecified: Secondary | ICD-10-CM | POA: Diagnosis not present

## 2016-09-03 DIAGNOSIS — R05 Cough: Secondary | ICD-10-CM

## 2016-09-03 DIAGNOSIS — R061 Stridor: Secondary | ICD-10-CM | POA: Diagnosis not present

## 2016-09-03 DIAGNOSIS — Z9221 Personal history of antineoplastic chemotherapy: Secondary | ICD-10-CM

## 2016-09-03 DIAGNOSIS — K219 Gastro-esophageal reflux disease without esophagitis: Secondary | ICD-10-CM | POA: Diagnosis present

## 2016-09-03 DIAGNOSIS — Z23 Encounter for immunization: Secondary | ICD-10-CM

## 2016-09-03 DIAGNOSIS — Z87891 Personal history of nicotine dependence: Secondary | ICD-10-CM | POA: Diagnosis not present

## 2016-09-03 DIAGNOSIS — J441 Chronic obstructive pulmonary disease with (acute) exacerbation: Secondary | ICD-10-CM | POA: Diagnosis not present

## 2016-09-03 DIAGNOSIS — J440 Chronic obstructive pulmonary disease with acute lower respiratory infection: Secondary | ICD-10-CM | POA: Diagnosis not present

## 2016-09-03 DIAGNOSIS — J9621 Acute and chronic respiratory failure with hypoxia: Secondary | ICD-10-CM

## 2016-09-03 DIAGNOSIS — R0600 Dyspnea, unspecified: Secondary | ICD-10-CM | POA: Diagnosis not present

## 2016-09-03 DIAGNOSIS — Z79899 Other long term (current) drug therapy: Secondary | ICD-10-CM

## 2016-09-03 DIAGNOSIS — J9600 Acute respiratory failure, unspecified whether with hypoxia or hypercapnia: Secondary | ICD-10-CM | POA: Diagnosis present

## 2016-09-03 DIAGNOSIS — R059 Cough, unspecified: Secondary | ICD-10-CM

## 2016-09-03 DIAGNOSIS — J9601 Acute respiratory failure with hypoxia: Secondary | ICD-10-CM | POA: Diagnosis not present

## 2016-09-03 DIAGNOSIS — R918 Other nonspecific abnormal finding of lung field: Secondary | ICD-10-CM | POA: Diagnosis not present

## 2016-09-03 DIAGNOSIS — C342 Malignant neoplasm of middle lobe, bronchus or lung: Secondary | ICD-10-CM | POA: Diagnosis not present

## 2016-09-03 DIAGNOSIS — Z85118 Personal history of other malignant neoplasm of bronchus and lung: Secondary | ICD-10-CM | POA: Diagnosis not present

## 2016-09-03 HISTORY — PX: FLEXIBLE BRONCHOSCOPY: SHX5094

## 2016-09-03 LAB — CBC
HEMATOCRIT: 39.3 % — AB (ref 40.0–52.0)
HEMOGLOBIN: 13.5 g/dL (ref 13.0–18.0)
MCH: 27.4 pg (ref 26.0–34.0)
MCHC: 34.3 g/dL (ref 32.0–36.0)
MCV: 80 fL (ref 80.0–100.0)
Platelets: 323 10*3/uL (ref 150–440)
RBC: 4.92 MIL/uL (ref 4.40–5.90)
RDW: 13.6 % (ref 11.5–14.5)
WBC: 17.8 10*3/uL — ABNORMAL HIGH (ref 3.8–10.6)

## 2016-09-03 LAB — MRSA PCR SCREENING: MRSA by PCR: NEGATIVE

## 2016-09-03 LAB — BASIC METABOLIC PANEL
Anion gap: 10 (ref 5–15)
BUN: 15 mg/dL (ref 6–20)
CHLORIDE: 101 mmol/L (ref 101–111)
CO2: 22 mmol/L (ref 22–32)
Calcium: 8.9 mg/dL (ref 8.9–10.3)
Creatinine, Ser: 1.08 mg/dL (ref 0.61–1.24)
GFR calc Af Amer: >60 mL/min (ref >60)
GLUCOSE: 106 mg/dL — AB (ref 65–99)
Potassium: 4.6 mmol/L (ref 3.5–5.1)
Sodium: 133 mmol/L — ABNORMAL LOW (ref 135–145)

## 2016-09-03 SURGERY — BRONCHOSCOPY, FLEXIBLE
Anesthesia: Moderate Sedation

## 2016-09-03 MED ORDER — MIDAZOLAM HCL 2 MG/2ML IJ SOLN
INTRAMUSCULAR | Status: AC | PRN
  Administered 2016-09-03: 1 mg via INTRAVENOUS
  Administered 2016-09-03: 2 mg via INTRAVENOUS

## 2016-09-03 MED ORDER — IPRATROPIUM-ALBUTEROL 0.5-2.5 (3) MG/3ML IN SOLN
3.0000 mL | Freq: Four times a day (QID) | RESPIRATORY_TRACT | Status: DC
  Administered 2016-09-03: 3 mL via RESPIRATORY_TRACT
  Filled 2016-09-03: qty 3

## 2016-09-03 MED ORDER — BUDESONIDE 0.25 MG/2ML IN SUSP
0.2500 mg | RESPIRATORY_TRACT | Status: AC
  Administered 2016-09-03: 0.25 mg via RESPIRATORY_TRACT
  Filled 2016-09-03: qty 2

## 2016-09-03 MED ORDER — IPRATROPIUM-ALBUTEROL 0.5-2.5 (3) MG/3ML IN SOLN
RESPIRATORY_TRACT | Status: AC
  Administered 2016-09-03: 3 mL via RESPIRATORY_TRACT
  Filled 2016-09-03: qty 3

## 2016-09-03 MED ORDER — FENTANYL CITRATE (PF) 100 MCG/2ML IJ SOLN
INTRAMUSCULAR | Status: AC | PRN
  Administered 2016-09-03: 100 ug via INTRAVENOUS
  Administered 2016-09-03: 50 ug via INTRAVENOUS

## 2016-09-03 MED ORDER — FENTANYL CITRATE (PF) 100 MCG/2ML IJ SOLN
INTRAMUSCULAR | Status: AC
  Filled 2016-09-03: qty 4

## 2016-09-03 MED ORDER — TIOTROPIUM BROMIDE MONOHYDRATE 18 MCG IN CAPS
18.0000 ug | ORAL_CAPSULE | Freq: Every day | RESPIRATORY_TRACT | Status: DC
  Administered 2016-09-03 – 2016-09-04 (×2): 18 ug via RESPIRATORY_TRACT
  Filled 2016-09-03: qty 5

## 2016-09-03 MED ORDER — ASPIRIN 300 MG RE SUPP: 300.0000 mg | RECTAL | Status: AC

## 2016-09-03 MED ORDER — IPRATROPIUM-ALBUTEROL 0.5-2.5 (3) MG/3ML IN SOLN
RESPIRATORY_TRACT | Status: AC
  Filled 2016-09-03: qty 3

## 2016-09-03 MED ORDER — DILTIAZEM HCL 25 MG/5ML IV SOLN
10.0000 mg | Freq: Once | INTRAVENOUS | Status: AC
  Administered 2016-09-03: 10 mg via INTRAVENOUS

## 2016-09-03 MED ORDER — INFLUENZA VAC SPLIT QUAD 0.5 ML IM SUSY
0.5000 mL | PREFILLED_SYRINGE | INTRAMUSCULAR | Status: AC
  Administered 2016-09-04: 0.5 mL via INTRAMUSCULAR
  Filled 2016-09-03: qty 0.5

## 2016-09-03 MED ORDER — DILTIAZEM HCL 25 MG/5ML IV SOLN
INTRAVENOUS | Status: AC
  Filled 2016-09-03: qty 5

## 2016-09-03 MED ORDER — IPRATROPIUM-ALBUTEROL 0.5-2.5 (3) MG/3ML IN SOLN
3.0000 mL | RESPIRATORY_TRACT | Status: DC
  Administered 2016-09-03 – 2016-09-04 (×4): 3 mL via RESPIRATORY_TRACT
  Filled 2016-09-03 (×4): qty 3

## 2016-09-03 MED ORDER — MIDAZOLAM HCL 5 MG/5ML IJ SOLN
INTRAMUSCULAR | Status: AC
  Filled 2016-09-03: qty 10

## 2016-09-03 MED ORDER — IPRATROPIUM-ALBUTEROL 0.5-2.5 (3) MG/3ML IN SOLN
3.0000 mL | RESPIRATORY_TRACT | Status: AC
  Administered 2016-09-03: 3 mL via RESPIRATORY_TRACT

## 2016-09-03 MED ORDER — SODIUM CHLORIDE 0.9 % IV SOLN: 250.0000 mL | INTRAVENOUS | Status: DC | PRN

## 2016-09-03 MED ORDER — ACETAMINOPHEN 325 MG PO TABS: 650.0000 mg | ORAL_TABLET | ORAL | Status: DC | PRN

## 2016-09-03 MED ORDER — IPRATROPIUM-ALBUTEROL 0.5-2.5 (3) MG/3ML IN SOLN
RESPIRATORY_TRACT | Status: AC
  Administered 2016-09-03: 3 mL
  Filled 2016-09-03: qty 3

## 2016-09-03 MED ORDER — HYDROCODONE-HOMATROPINE 5-1.5 MG/5ML PO SYRP
5.0000 mL | ORAL_SOLUTION | ORAL | Status: DC | PRN
  Administered 2016-09-03 – 2016-09-04 (×2): 5 mL via ORAL
  Filled 2016-09-03 (×2): qty 5

## 2016-09-03 MED ORDER — METHYLPREDNISOLONE SODIUM SUCC 125 MG IJ SOLR
80.0000 mg | Freq: Once | INTRAMUSCULAR | Status: AC
  Administered 2016-09-03: 80 mg via INTRAVENOUS

## 2016-09-03 MED ORDER — BUDESONIDE 0.5 MG/2ML IN SUSP
0.5000 mg | Freq: Two times a day (BID) | RESPIRATORY_TRACT | Status: AC
  Administered 2016-09-03 – 2016-09-04 (×2): 0.5 mg via RESPIRATORY_TRACT
  Filled 2016-09-03 (×2): qty 2

## 2016-09-03 MED ORDER — ASPIRIN 81 MG PO CHEW
324.0000 mg | CHEWABLE_TABLET | ORAL | Status: AC
  Administered 2016-09-03: 324 mg via ORAL
  Filled 2016-09-03: qty 4

## 2016-09-03 MED ORDER — METHYLPREDNISOLONE SODIUM SUCC 125 MG IJ SOLR
INTRAMUSCULAR | Status: AC
  Administered 2016-09-03: 80 mg via INTRAVENOUS
  Filled 2016-09-03: qty 2

## 2016-09-03 NOTE — H&P (Signed)
Patient seen and examined, H&P per clinic note on 08/26/16. No significant changes. Images reviewed, and procedure(s) planned accordingly.  A:70 yo with Hx of Lung Ca, now with RUL consolidative process and possible cancer recurrence.   P: Bronchoscopy - inspection, BAL, possible biopsies if endobronchial lesion is seen in the Right lung.     I, the ordering provider, attest that I have discussed with the patient the benefits, risks, side effects, alternatives, likelihood of achieving goals and potential problems during recovery for the procedure that I have provided informed consent.   Vilinda Boehringer, MD [facility] Pager [phone] (please enter 7-digits) On Call Pager - [phone] (please enter 7-digits) Clinic - [phone]

## 2016-09-03 NOTE — OR Nursing (Signed)
Unable to tolerate decrease O2 and additional breathing treatment ordered by MD and O2 increased to 5 liters

## 2016-09-03 NOTE — Sedation Documentation (Signed)
Desated, bronch tube out ,bagged to improve sats of 82%, sat returned to 96% case ended

## 2016-09-03 NOTE — Sedation Documentation (Signed)
Scope replaced and bronchoscope continues

## 2016-09-03 NOTE — OR Nursing (Signed)
Attempted to ween down on O2, desat at 2 liters NP to 88% Dr. Stevenson Clinch placed on page . To ? Cough med, pt with persistant cough . Reported heart rate of 130's. Plan: additional breathing treatment ordered

## 2016-09-03 NOTE — Sedation Documentation (Signed)
De sat to 78%, HR remains but pt bagged with bronchoscope out and sat's return to94%. MD in attendant. Quick response to curent treatment .Bagged less then 1 minute

## 2016-09-03 NOTE — Discharge Instructions (Signed)

## 2016-09-03 NOTE — OR Nursing (Signed)
Seen by DR Juanell Fairly and additional treatment ordered and given. Awaiting CXR report. Unable to currently tolerate decrease O2 Changed to 100% NRB

## 2016-09-03 NOTE — Procedures (Addendum)
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      Unable to obtain consent; procedure performed on emergent basis.     Other:       PRESEDATION ASSESSMENT: History and Physical has been performed. Patient meds and allergies have been reviewed. Presedation airway examination has been performed and documented. Baseline vital signs, sedation score, oxygenation status, and cardiac rhythm were reviewed. Patient was deemed to be in satisfactory condition to undergo the procedure.  PREMEDICATIONS:   Sedative/Narcotic Amt Dose   Versed  mg   Fentanyl  mcg  Diprivan  mg        Airway Prep (Place X beside choice below)   1% Transtracheal Lidocaine Anesthetization 7 cc   Patient prepped per Bronchoscopy Lab Policy       Insertion Route (Place X beside choice below)   Nasal  x Oral   Endotracheal Tube   Tracheostomy   INTRAPROCEDURE MEDICATIONS:  Sedative/Narcotic Amt Dose   Versed 3 mg   Fentanyl 150 mcg  Diprivan  mg       Medication Amt Dose  Medication Amt Dose  Xylocaine 2% 3 cc  Epinephrine 1:10,000 sol  cc  Xylocaine 4%  cc  Cocaine  cc   TECHNICAL PROCEDURES: (Place X beside choice below)   Procedures  Description    None     Electrocautery     Cryotherapy     Balloon Dilatation     Bronchography     Stent Placement   x  Therapeutic Aspiration     Laser/Argon Plasma    Brachytherapy Catheter Placement    Foreign Body Removal     SPECIMENS (Sites): (Place X beside choice below)  Specimens Description   No Specimens Obtained     Washings    Lavage   x Biopsies RML,RLL   Fine Needle Aspirates   x Brushings RLL   Sputum    FINDINGS: see below  ESTIMATED BLOOD LOSS: <4QA  COMPLICATIONS/RESOLUTION: desaturation x 2 to 84%, acutely resolved to 98% with withdrawal of scope and gentle bagging.   PROCEDURE DETAILS: Timeout performed and correct patient, name, & ID confirmed. Following prep per Pulmonary policy, appropriate sedation was administered.  Airway exam proceeded with findings, technical procedures, and specimen collection as noted below. At the end of exam the scope was withdrawn without incident. Impression and Plan as noted below.   Inspection: Right - no overt endobronchial lesion; the RML was completed stenosed with edematous and pale tissue around the orifice. The mucosa on the right was erythematous RLL orifice with erythematous, pale appearing, and swollen tissue.   Left - no endobronchial lesion, thin clear secretions, severe bronchospasms of the distal LMS   Procedure: Forceps biopsy of RML orifice Forceps biopsy of RLL orifice (anterior/lateral orifice) Brush of RLL orifice (anterior/lateral orifie)  IMPLANTED DEVICE(S):none  IMPRESSION:POST-PROCEDURE DX: malignancy  RECOMMENDATION/PLAN: follow up biopsy results    ADDITIONAL COMMENTS:none   Procedure Time:25 mins   Vilinda Boehringer, MD  Pulmonary and Critical Care Pager : [phone] (  Please enter 7 digits) On call pager [phone] (please enter 7-digits) Clinic - [phone]

## 2016-09-03 NOTE — Progress Notes (Signed)
Received report from nurse in specials, patient arrived on floor and received breathing tx. Admission and assessment completed. Patient has no complaints, family and patient updated on plan of care. Will continue to assess. Christopher Huber

## 2016-09-03 NOTE — H&P (Addendum)
[facility] H&P    ASSESSMENT/PLAN     A:Acute respiratory failure due to acute bronchitis/AECOPD secondary to recent bronchoscopy with endobronchial biopsies.  Recurrent lung cancer, s/p bronchoscopy, results pending.  Stridor- with respiratory distress.  Tachycardia.  P:   --Continue steroids.  --Continue oxygen, wean down as tolerated.  --obtain 12 lead, may need to start on IV cardizem if in Afib.  --Cough medicine.  --Admit to stepdown.   Best Practices  DVT Prophylaxis: hold.  GI Prophylaxis: not indicated.    ---------------------------------------  ---------------------------------------   Name: Christopher Huber MRN: [id number] DOB: [birth year]    ADMISSION DATE:  09/03/2016   CHIEF COMPLAINT:  Dyspnea.    HISTORY OF PRESENT ILLNESS:   The patient is a 70 yo male s/p recurrent adenocarcinoma lung cancer. 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 Back in November 2016 there was a 2.1 cm hypermetabolic right central lobe nodule central cavitation and hypermetabolic activity in the right hilar region consistent with metastatic disease. He was treated with salvage chemotherapy with cis-platinum and pemetrexed.  Repeat PET CT scan which showed excellent response to therapy although there are still some hypermetabolic activity noted in that area of nodule as well as right hilar region he also has changed of radiation fibrosis.   More recently he was seen in pulmonary clinic for persistent cough, as well as inspiratory stridor, hoarseness and went for a repeat CT chest which suggested recurrence. He then was scheduled for a bronchoscopy today.   He has had severe coughing spasms  and desats since that bronchoscopy. Currently he has received duoneb and pulmicort treatments with little relief, he has been on 5-6L with tachypnea, tachycardia of 140; and continued desats to 89% therefore was switched to NRB. Review of CXR shows persistent chronic changes, without significant change.    Oncology history: Patient was initially diagnosed with left upper lobe adenocarcinoma in 2014 and received 8 cycles of concurrent weekly carboplatinum and Taxol along with XRT between February 17, 2013 and Apr 07, 2013. He did not receive consolidation treatment at that time.  Patient was then placed on maintenance Tarceva. He then had a recurrence in his right upper lobe and subsequently underwent 4 cycles of cisplatin and pemetrexed between November 14, 2015 and January 16, 2016. Pemetrexed was subsequently discontinued secondary significant rash.    PAST MEDICAL HISTORY :  Past Medical History:  Diagnosis Date  . Arthritis    right arm  . GERD (gastroesophageal reflux disease)   . Lung cancer ([place]) 2013   Rad tx's and chemo.    Past Surgical History:  Procedure Laterality Date  . COLONOSCOPY    . ESOPHAGOGASTRODUODENOSCOPY    . Left shoulder reconstruction  ~1965   after hunting accident   Prior to Admission medications   Medication Sig Start Date End Date Taking? Authorizing Provider  albuterol (PROVENTIL HFA;VENTOLIN HFA) 108 (90 Base) MCG/ACT inhaler Inhale 2 puffs into the lungs every 6 (six) hours as needed for wheezing or shortness of breath. 06/11/16   Venia Carbon, MD  Ascorbic Acid (VITAMIN C) 1000 MG tablet Take 1,000 mg by mouth daily.    Historical Provider, MD  Cholecalciferol (VITAMIN D) 2000 UNITS CAPS Take 1 capsule  by mouth daily.    Historical Provider, MD  fluticasone-salmeterol (ADVAIR HFA) 115-21 MCG/ACT inhaler Inhale 2 puffs into the lungs 2 (two) times daily. 08/07/16 08/08/16  Flora Lipps, MD  GENERLAC 10 GM/15ML SOLN Reported on 03/05/2016 12/28/15   Historical  Provider, MD  HYDROcodone-homatropine (HYCODAN) 5-1.5 MG/5ML syrup 1 tsp po at night before bed prn cough 08/13/16   Flora Lipps, MD  ipratropium-albuterol (DUONEB) 0.5-2.5 (3) MG/3ML SOLN Take 3 mLs by nebulization every 4 (four) hours. 08/19/16   Flora Lipps, MD  magnesium oxide (MAG-OX) 400 MG tablet Take 400 mg by mouth daily.    Historical Provider, MD  Multiple Vitamin (MULTIVITAMIN WITH MINERALS) TABS Take 1 tablet by mouth daily.    Historical Provider, MD  omeprazole (PRILOSEC) 40 MG capsule  07/28/16   Historical Provider, MD  tiotropium (SPIRIVA) 18 MCG inhalation capsule Place 1 capsule (18 mcg total) into inhaler and inhale daily. 08/05/16   Flora Lipps, MD  traMADol (ULTRAM) 50 MG tablet Take 50 mg by mouth every 6 (six) hours as needed.    Historical Provider, MD  vitamin E 400 UNIT capsule Take 400 Units by mouth daily.    Historical Provider, MD   Allergies  Allergen Reactions  . Pemetrexed Rash  . Codeine Nausea And Vomiting    Dizzy     FAMILY HISTORY:  Family History  Problem Relation Age of Onset  . Heart disease Father   . Diabetes Neg Hx   . Cancer Neg Hx    SOCIAL HISTORY:  reports that he quit smoking about 19 years ago. He has a 15.00 pack-year smoking history. He has never used smokeless tobacco. He reports that he does not drink alcohol or use drugs.  REVIEW OF SYSTEMS:   Constitutional: Feels well. Cardiovascular: No chest pain.  Pulmonary: Denies dyspnea.   The remainder of systems were reviewed and were found to be negative other than what is documented in the HPI.    VITAL SIGNS: Temp:  [98.2 F (36.8 C)] 98.2 F (36.8 C) (09/27 1211) Pulse Rate:  [100-113] 107 (09/27 1400) Resp:  [10-25] 21 (09/27 1400) BP: (93-124)/(66-95) 116/71 (09/27 1500) SpO2:  [91 %-97 %] 97 % (09/27 1400) Weight:  [175 lb (79.4 kg)] 175 lb (79.4 kg) (09/27 1211) HEMODYNAMICS:   VENTILATOR SETTINGS:   INTAKE / OUTPUT: No intake or output data in the 24 hours ending  09/03/16 1610  Physical Examination:   VS: BP 116/71   Pulse (!) 107   Temp 98.2 F (36.8 C) (Oral)   Resp (!) 21   Ht '5\' 9"'$  (1.753 m)   Wt 175 lb (79.4 kg)   SpO2 97%   BMI 25.84 kg/m   General Appearance: No distress  Neuro:without focal findings, dyspneic.  HEENT: PERRLA, EOM intact, no ptosis, no other lesions noticed;  Pulmonary: stridor.  CardiovascularNormal S1,S2.  No m/r/g.    Abdomen: Benign, Soft, non-tender, No masses, hepatosplenomegaly, No lymphadenopathy Renal:  No costovertebral tenderness  GU:  Not performed at this time. Endoc: No evident thyromegaly, Skin:   warm, no rashes, no ecchymosis  Extremities: normal, no cyanosis, clubbing,    LABS: Reviewed   LABORATORY PANEL:   CBC No results for input(s): WBC, HGB, HCT, PLT in the last 168 hours.  Chemistries  No results for input(s): NA, K, CL, CO2, GLUCOSE, BUN, CREATININE, CALCIUM, MG, PHOS, AST, ALT, ALKPHOS, BILITOT in the last 168 hours.  Invalid input(s): GFRCGP  No results for input(s): GLUCAP in  the last 168 hours. No results for input(s): PHART, PCO2ART, PO2ART in the last 168 hours. No results for input(s): AST, ALT, ALKPHOS, BILITOT, ALBUMIN in the last 168 hours.  Cardiac Enzymes No results for input(s): TROPONINI in the last 168 hours.  RADIOLOGY:  X-ray Chest Pa Or Ap  Result Date: 09/03/2016 CLINICAL DATA:  Status post bronchoscopy. Continuous cough currently. History of bilateral lung malignancy with new right-sided opacities. EXAM: CHEST 1 VIEW COMPARISON:  PA and lateral chest x-ray of August 26, 2016 and CT scan of the chest of August 27, 2016. FINDINGS: The left lung remains hyperinflated and largely clear. Fibrotic changes in the upper lobe are stable. On the right there is confluent increased density in the upper lobe not greatly changed from the previous study. There is no pneumothorax or pleural effusion. The hilar structures are retracted superiorly the heart is normal  in size. The pulmonary vascularity is not engorged. The Port-A-Cath tip projects over the midportion of the SVC. The bony thorax exhibits no acute abnormality. IMPRESSION: No postprocedure pneumothorax, hemothorax, or pneumomediastinum. Persistent parenchymal consolidation in the upper lobes greatest on the right. Electronically Signed   By: David  Martinique M.D.   On: 09/03/2016 16:06       --Deep Ashby Dawes, MD.  Board Certified in Internal Medicine, Pulmonary Medicine, [place], and Sleep Medicine.  ICU Pager [phone] [facility] Office Number: [phone]  Patricia Pesa, M.D.  Vilinda Boehringer, M.D.  Merton Border, M.D   09/03/2016, 4:10 PM  Critical Care Attestation.  I have personally obtained a history, examined the patient, evaluated laboratory and imaging results, formulated the assessment and plan and placed orders. The Patient requires high complexity decision making for assessment and support, frequent evaluation and titration of therapies, application of advanced monitoring technologies and extensive interpretation of multiple databases. The patient has critical illness that could lead imminently to failure of 1 or more organ systems and requires the highest level of physician preparedness to intervene.  Critical Care Time devoted to patient care services described in this note is 35 minutes and is exclusive of time spent in procedures.

## 2016-09-03 NOTE — OR Nursing (Signed)
Resting more comfortable at present. Will attempt to ween down O2 in O2 5 liters. Examine by Dr. Juanell Fairly

## 2016-09-04 ENCOUNTER — Telehealth: Payer: Self-pay | Admitting: Internal Medicine

## 2016-09-04 ENCOUNTER — Inpatient Hospital Stay: Payer: Medicare Other

## 2016-09-04 ENCOUNTER — Encounter: Payer: Self-pay | Admitting: Pulmonary Disease

## 2016-09-04 ENCOUNTER — Telehealth: Payer: Self-pay | Admitting: *Deleted

## 2016-09-04 DIAGNOSIS — Z23 Encounter for immunization: Secondary | ICD-10-CM | POA: Diagnosis not present

## 2016-09-04 DIAGNOSIS — J9601 Acute respiratory failure with hypoxia: Secondary | ICD-10-CM

## 2016-09-04 LAB — BASIC METABOLIC PANEL
ANION GAP: 10 (ref 5–15)
BUN: 17 mg/dL (ref 6–20)
CALCIUM: 9.1 mg/dL (ref 8.9–10.3)
CO2: 22 mmol/L (ref 22–32)
Chloride: 101 mmol/L (ref 101–111)
Creatinine, Ser: 0.95 mg/dL (ref 0.61–1.24)
Glucose, Bld: 158 mg/dL — ABNORMAL HIGH (ref 65–99)
Potassium: 4.3 mmol/L (ref 3.5–5.1)
Sodium: 133 mmol/L — ABNORMAL LOW (ref 135–145)

## 2016-09-04 LAB — SURGICAL PATHOLOGY

## 2016-09-04 LAB — GLUCOSE, CAPILLARY: Glucose-Capillary: 100 mg/dL — ABNORMAL HIGH (ref 65–99)

## 2016-09-04 LAB — CYTOLOGY - NON PAP

## 2016-09-04 MED ORDER — TIOTROPIUM BROMIDE MONOHYDRATE 18 MCG IN CAPS: 18.0000 ug | ORAL_CAPSULE | Freq: Every day | RESPIRATORY_TRACT | 12 refills | Status: AC

## 2016-09-04 MED ORDER — HYDROCODONE-HOMATROPINE 5-1.5 MG/5ML PO SYRP: 5.0000 mL | ORAL_SOLUTION | ORAL | 0 refills | Status: DC | PRN

## 2016-09-04 MED ORDER — METHYLPREDNISOLONE SODIUM SUCC 125 MG IJ SOLR
80.0000 mg | Freq: Once | INTRAMUSCULAR | Status: AC
  Administered 2016-09-04: 80 mg via INTRAVENOUS
  Filled 2016-09-04: qty 2

## 2016-09-04 MED ORDER — PREDNISONE 20 MG PO TABS: 40.0000 mg | ORAL_TABLET | Freq: Every day | ORAL | 0 refills | Status: DC

## 2016-09-04 MED ORDER — GUAIFENESIN-DM 100-10 MG/5ML PO SYRP
5.0000 mL | ORAL_SOLUTION | ORAL | Status: DC | PRN
  Administered 2016-09-04: 5 mL via ORAL
  Filled 2016-09-04: qty 5

## 2016-09-04 NOTE — Progress Notes (Signed)
Patient respiratory status WNL today. Discharge per MD order. Dr. Earlean Shawl gave patient prescriptions to carry to pharmacy. Patient discharge instructions reviewed with patient and his wife. PIV x 1 removed, pressure bandage placed. Patient transported by auxillary to [facility] for discharge to home with wife.

## 2016-09-04 NOTE — Telephone Encounter (Signed)
Pt wife states pt just got home from ICU and DR. Ashby Dawes was going to give a rx for prednisone, but did not receive one. Please call and advise.

## 2016-09-04 NOTE — Telephone Encounter (Signed)
Wife informed that we have sent Prednisone 40 mg daily x 10 days per DK. Nothing further needed.

## 2016-09-04 NOTE — Progress Notes (Signed)
End of shift report given to [facility] RN

## 2016-09-04 NOTE — Telephone Encounter (Signed)
Ok thanks 

## 2016-09-04 NOTE — Discharge Summary (Signed)
Physician Discharge Summary  Patient ID: MATE ALEGRIA MRN: [id number] DOB/AGE: 1946/05/03 70 y.o.  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  -Recurrent lung cancer, s/p bronchoscopy, results pending.  -Stridor- with respiratory distress.  -Tachycardia.                                                                        DISCHARGE PLAN BY DIAGNOSIS    Continue Bronchodilators albuterol/advair/spiriva Continue  Cough medicine Follow up with Pulmonologist              DISCHARGE SUMMARY   Christopher Huber is a 70 y.o. y/o male with recurrent adenocarcinoma lung cancer. 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 Back in November 2016 there was a 2.1 cm hypermetabolic right central lobe nodule central cavitation and hypermetabolic activity in the right hilar region consistent with metastatic disease. He was treated with salvage chemotherapy with cis-platinum and pemetrexed.  Repeat PET CT scan which showed excellent response to therapy although there are still some hypermetabolic activity noted in that area of nodule as well as right hilar region he also has changed of radiation fibrosis.   More recently he was seen in pulmonary clinic for persistent cough, as well as inspiratory stridor, hoarseness and went for a repeat CT chest which suggested recurrence. He then was scheduled for a bronchoscopy today  Patient experienced severe coughing episodes and bronchospasms s/p bronchoscopy on 9/27.  Patient received duoneb and pulmicort treatments with little releif.  Patient  was monitored in the ICU overnight  Oncology history: Patient was initially diagnosed with left upper lobe adenocarcinoma in 2014 and received 8 cycles of concurrent weekly carboplatinum and Taxol along with XRT between February 17, 2013 and Apr 07, 2013. He did not receive consolidation treatment at that time. Patient was then placed on maintenance Tarceva. He then had a recurrence in his right upper lobe and subsequently underwent 4 cycles of cisplatin and pemetrexed between November 14, 2015 and January 16, 2016. Pemetrexed was subsequently discontinued secondary significant rash.               SIGNIFICANT DIAGNOSTIC STUDIES Bronchoscopy - inspection, BAL, possible biopsies if endobronchial lesion is seen in the Right lung.  SIGNIFICANT EVENTS 9/28 Patient admitted to the ICU for observation s/p bronchoscopy due to persistent coughing, stridor and bronchospasm.  MICRO DATA  none  ANTIBIOTICS None   CONSULTS 0/27 PCCM>>  TUBES / LINES Right chest port > not accessed   Discharge Exam: General Appearance: No distress  Neuro:without focal findings, dyspneic.  HEENT: PERRLA, EOM intact, no ptosis, no other lesions noticed;  Pulmonary: stridor.  CardiovascularNormal S1,S2.  No m/r/g.    Abdomen: Benign, Soft, non-tender, No masses, hepatosplenomegaly, No lymphadenopathy Renal:  No costovertebral tenderness  GU:  Not performed at this time. Endoc: No evident thyromegaly, Skin:   warm, no rashes, no  ecchymosis  Extremities: normal, no cyanosis, clubbing,    Vitals:   09/03/16 2300 09/04/16 0000 09/04/16 0100 09/04/16 0200  BP: 116/76 109/71 100/71 125/76  Pulse: (!) 112 96 96 97  Resp: (!) _0 Temp:  98.8 F (37.1 C)    TempSrc:  Oral    SpO2: 99% 97% 97% 96%  Weight:      Height:         Discharge Labs  BMET  Recent Labs Lab 09/03/16 1759  NA 133*  K 4.6  CL 101  CO2 22  GLUCOSE 106*  BUN 15  CREATININE 1.08  CALCIUM 8.9    CBC  Recent  Labs Lab 09/03/16 1759  HGB 13.5  HCT 39.3*  WBC 17.8*  PLT 323    Anti-Coagulation No results for input(s): INR in the last 168 hours.     Follow-up Information    Wilhelmina Mcardle, MD .   Specialty:  Pulmonary Disease Why:  Pt already has appt Contact information: [address] [place] [address] [phone]          Disposition: Home  Discharged Condition: TAVARAS GOODY has met maximum benefit of inpatient care and is medically stable and cleared for discharge.  Patient is pending follow up as above.        Time spent on disposition:  Greater than 45 minutes.    [facility]

## 2016-09-04 NOTE — Telephone Encounter (Signed)
Called to report that pathology from RML and RLL is adenocarcinoma, same as he previously had. There is not enough tissue to do additional testing

## 2016-09-05 ENCOUNTER — Telehealth: Payer: Self-pay | Admitting: Internal Medicine

## 2016-09-05 NOTE — Telephone Encounter (Signed)
Was routed original pathology report which just showed radiation damage. This prompted my optimistic comments on the report. Then the report was apparently addended---and added presence of adenocarcinoma. I have not discussed this with the patient

## 2016-09-08 NOTE — Progress Notes (Signed)
[place]  [phone] [phone]) [phone]  ID: Christopher Huber OB: 1946-06-15  MR#: [id number]  EYC#:[id number]  Patient Care Team: Venia Carbon, MD as PCP - General (Internal Medicine)  CHIEF COMPLAINT: Recurrent bilateral adenocarcinoma lung cancer.  INTERVAL HISTORY: Patient returns to clinic today to discuss his biopsy results and treatment planning. He continues to have increased hoarseness of voice, shortness of breath, and stridor. He otherwise feels well. He has no neurologic complaints. He denies any fevers. He has a fair appetite, but denies weight loss. He continues to have right shoulder pain. He does not complain of constipation today. He has difficulty sleeping. He denies any nausea, vomiting, constipation, or diarrhea. He has no urinary complaints. Patient offers no further specific complaints today.   REVIEW OF SYSTEMS:   Review of Systems  Constitutional: Negative for fever, malaise/fatigue and weight loss.  Respiratory: Positive for cough and stridor. Negative for hemoptysis, sputum production and shortness of breath.   Cardiovascular: Negative.  Negative for chest pain.  Gastrointestinal: Negative for constipation, nausea and vomiting.  Musculoskeletal: Positive for back pain and joint pain.  Skin: Negative for rash.  Neurological: Positive for tingling. Negative for weakness.  Psychiatric/Behavioral: The patient is nervous/anxious.     As per HPI. Otherwise, a complete review of systems is negative.  PAST MEDICAL HISTORY: Past Medical History:  Diagnosis Date  . Arthritis    right arm  . GERD (gastroesophageal reflux disease)   . Lung cancer ([place]) 2013   Rad tx's and chemo.     PAST SURGICAL HISTORY: Past Surgical History:  Procedure Laterality Date  . COLONOSCOPY    . ESOPHAGOGASTRODUODENOSCOPY    . FLEXIBLE BRONCHOSCOPY N/A 09/03/2016   Procedure: FLEXIBLE BRONCHOSCOPY;  Surgeon: Wilhelmina Mcardle, MD;  Location: [facility]  ORS;  Service: Pulmonary;  Laterality: N/A;  . Left shoulder reconstruction  ~1965   after hunting accident    FAMILY HISTORY Family History  Problem Relation Age of Onset  . Heart disease Father   . Diabetes Neg Hx   . Cancer Neg Hx        ADVANCED DIRECTIVES:    HEALTH MAINTENANCE: Social History  Substance Use Topics  . Smoking status: Former Smoker    Packs/day: 1.00    Years: 15.00    Quit date: 06/27/1997  . Smokeless tobacco: Never Used  . Alcohol use No     Colonoscopy:  PAP:  Bone density:  Lipid panel:  Allergies  Allergen Reactions  . Pemetrexed Rash  . Codeine Nausea And Vomiting    Dizzy     Current Outpatient Prescriptions  Medication Sig Dispense Refill  . albuterol (PROVENTIL HFA;VENTOLIN HFA) 108 (90 Base) MCG/ACT inhaler Inhale 2 puffs into the lungs every 6 (six) hours as needed for wheezing or shortness of breath. 1 Inhaler 3  . Ascorbic Acid (VITAMIN C) 1000 MG tablet Take 1,000 mg by mouth daily.    . Cholecalciferol (VITAMIN D) 2000 UNITS CAPS Take 1 capsule by mouth daily.    . fluticasone-salmeterol (ADVAIR HFA) 115-21 MCG/ACT inhaler Inhale 2 puffs into the lungs 2 (two) times daily. 1 Inhaler 12  . GENERLAC 10 GM/15ML SOLN Reported on 03/05/2016  1  . HYDROcodone-homatropine (HYCODAN) 5-1.5 MG/5ML syrup 1 tsp po at night before bed prn cough 120 mL 0  . magnesium oxide (MAG-OX) 400 MG tablet Take 400 mg by mouth daily.    . Multiple Vitamin (MULTIVITAMIN WITH MINERALS) TABS Take 1 tablet by mouth  daily.    . omeprazole (PRILOSEC) 40 MG capsule Take 40 mg by mouth daily.     . predniSONE (DELTASONE) 10 MG tablet Take 1 tablet (10 mg total) by mouth daily with breakfast. 30 tablet 5  . tiotropium (SPIRIVA) 18 MCG inhalation capsule Place 1 capsule (18 mcg total) into inhaler and inhale daily. 30 capsule 12  . traMADol (ULTRAM) 50 MG tablet Take 50 mg by mouth every 6 (six) hours as needed.    . vitamin E 400 UNIT capsule Take 400 Units by  mouth daily.     No current facility-administered medications for this visit.    Facility-Administered Medications Ordered in Other Visits  Medication Dose Route Frequency Provider Last Rate Last Dose  . sodium chloride flush (NS) 0.9 % injection 10 mL  10 mL Intravenous PRN Lloyd Huger, MD   10 mL at 05/26/16 1056    OBJECTIVE: Vitals:   09/10/16 0911  BP: 118/80  Pulse: (!) 109  Resp: 18  Temp: (!) 95.8 F (35.4 C)     Body mass index is 25.39 kg/m.    ECOG FS:1 - Symptomatic but completely ambulatory  General: Well-developed, well-nourished, no acute distress. Eyes: anicteric sclera. Lungs: Stridor, diminished right breath sounds. Heart: Regular rate and rhythm. No rubs, murmurs, or gallops. Abdomen: Soft, nontender, nondistended. No organomegaly noted, normoactive bowel sounds. Musculoskeletal: No edema, cyanosis, or clubbing. Neuro: Alert, answering all questions appropriately. Cranial nerves grossly intact. Skin: Rash has resolved. Psych: Normal affect.  LAB RESULTS:  Lab Results  Component Value Date   NA 133 (L) 09/04/2016   K 4.3 09/04/2016   CL 101 09/04/2016   CO2 22 09/04/2016   GLUCOSE 158 (H) 09/04/2016   BUN 17 09/04/2016   CREATININE 0.95 09/04/2016   CALCIUM 9.1 09/04/2016   PROT 7.0 02/06/2016   ALBUMIN 3.9 02/06/2016   AST 16 02/06/2016   ALT 11 (L) 02/06/2016   ALKPHOS 69 02/06/2016   BILITOT 0.4 02/06/2016   GFRNONAA >60 09/04/2016   GFRAA >60 09/04/2016    Lab Results  Component Value Date   WBC 17.8 (H) 09/03/2016   NEUTROABS 1.5 02/06/2016   HGB 13.5 09/03/2016   HCT 39.3 (L) 09/03/2016   MCV 80.0 09/03/2016   PLT 323 09/03/2016     STUDIES: X-ray Chest Pa Or Ap  Result Date: 09/03/2016 CLINICAL DATA:  Status post bronchoscopy. Continuous cough currently. History of bilateral lung malignancy with new right-sided opacities. EXAM: CHEST 1 VIEW COMPARISON:  PA and lateral chest x-ray of August 26, 2016 and CT scan of the  chest of August 27, 2016. FINDINGS: The left lung remains hyperinflated and largely clear. Fibrotic changes in the upper lobe are stable. On the right there is confluent increased density in the upper lobe not greatly changed from the previous study. There is no pneumothorax or pleural effusion. The hilar structures are retracted superiorly the heart is normal in size. The pulmonary vascularity is not engorged. The Port-A-Cath tip projects over the midportion of the SVC. The bony thorax exhibits no acute abnormality. IMPRESSION: No postprocedure pneumothorax, hemothorax, or pneumomediastinum. Persistent parenchymal consolidation in the upper lobes greatest on the right. Electronically Signed   By: David  Martinique M.D.   On: 09/03/2016 16:06   Dg Chest 2 View  Result Date: 08/26/2016 CLINICAL DATA:  Spoke with pt who states since being seen last is breathing has gone down hill, pt c/o increased sob, prod cough with white mucus, wheezing, chest discomfort.  Pt denies any fever, sweats or chills. Pt on 2L 02 qsh, pt states he went to the restroom and came back to the living room and checked his 02 level, pt was sating at 83. Pt currently wearing 2L 02 to get his 02 up. EXAM: CHEST  2 VIEW COMPARISON:  04/23/2016 FINDINGS: There is increased opacity extending from the right hilum to the right upper lobe. There is greater retraction of the minor fissure superiorly. Pleural thickening overlying the right upper lobe is stable. Scarring in the left upper lobe is stable. There are no other new areas of lung opacity. No pleural effusion.  No pneumothorax. Cardiac silhouette is normal in size. No convincing mediastinal masses. No left hilar mass or adenopathy. Right hilum is partly obscured by the contiguous right upper lobe opacity. Right anterior chest wall Port-A-Cath is stable and well positioned. Skeletal structures show no all osteolytic or osteoblastic lesions. IMPRESSION: 1. Increased opacity has developed  extending from the right hilum into the right upper lobe. Given the history, suspect pneumonia superimposed on chronic changes of radiation induced fibrosis. Electronically Signed   By: Lajean Manes M.D.   On: 08/26/2016 10:31   Ct Chest W Contrast  Result Date: 08/27/2016 CLINICAL DATA:  Left upper lobe lung cancer diagnosed in 2014 status post radiation therapy and chemotherapy, with right upper lobe recurrence treated with radiation therapy and chemotherapy, presents with stridor and increased right upper lung opacity on recent chest radiograph. EXAM: CT CHEST WITH CONTRAST TECHNIQUE: Multidetector CT imaging of the chest was performed during intravenous contrast administration. CONTRAST:  26m ISOVUE-300 IOPAMIDOL (ISOVUE-300) INJECTION 61% COMPARISON:  08/26/2016 chest radiograph. 05/07/2016 chest CT. 02/04/2016 PET-CT. FINDINGS: Cardiovascular: Normal heart size. Moderate pericardial effusion measuring up to 13 mm in thickness anteriorly, increased from 0.7 cm. Right internal jugular MediPort terminates at the cavoatrial junction. Left anterior descending and right coronary atherosclerosis. Atherosclerotic nonaneurysmal thoracic aorta. Normal caliber pulmonary arteries. No central pulmonary emboli. Mediastinum/Nodes: No discrete thyroid nodules. Unremarkable esophagus. No axillary adenopathy. No pathologically enlarged mediastinal or left hilar nodes. There is new infiltrative right hilar soft tissue attenuation measuring up to 1.5 cm in thickness (series 2/ image 66), increased from 0.8 cm, associated with new occlusion of the right middle lobe bronchus. Lungs/Pleura: No pneumothorax. New small right pleural effusion. No left pleural effusion. There is stable masslike fibrosis, distortion and volume loss in the central/ posterior left upper lobe. There is extensive new patchy consolidation, ground-glass opacity and reticulation throughout the central right lung predominantly involving the superior  segment right lower lobe and basilar right upper lobe, most suggestive of new radiation change. These findings are superimposed on prior areas of masslike fibrosis in the right upper lobe, for example a 3.2 x 1.1 cm apical right upper lobe focus of consolidation (series 3/image 22), which previously measured 3.2 x 1.2 cm on 05/07/2016 using similar measurement technique, unchanged. No acute consolidative airspace disease or new significant pulmonary nodules in the left lung. Stable small calcified pleural plaque on the right. Upper abdomen: Hypodense 1.2 cm renal cortical lesion in the posterior upper left kidney (series 2/ image 163), previously 1.1 cm, not definitely changed. Musculoskeletal: No aggressive appearing focal osseous lesions. Mild thoracic spondylosis . IMPRESSION: 1. New extensive patchy consolidation and ground-glass attenuation in the superior segment right lower lobe and right upper lobe, most suggestive of new radiation change. This finding is superimposed on stable prior areas of radiation fibrosis in the right upper lobe. 2. New infiltrative soft  tissue attenuation throughout the right hilum, worrisome for progression of infiltrative metastatic right hilar adenopathy. This finding is associated with new occlusion of the right middle lobe bronchus, which may account for the right-sided stridor. 3. No additional sites suspicious for metastatic disease in the chest. Stable radiation fibrosis in the left upper lobe. 4. Moderate pericardial effusion is increased. New small right pleural effusion. 5. Indeterminate small renal cortical lesion in the upper left kidney, not definitely changed, cannot exclude renal cell carcinoma. 6. Aortic atherosclerosis.  Two-vessel coronary atherosclerosis. These results will be called to the ordering clinician or representative by the Radiologist Assistant, and communication documented in the PACS or zVision Dashboard. Electronically Signed   By: Ilona Sorrel M.D.    On: 08/27/2016 16:12   Dg Chest Port 1 View  Result Date: 09/04/2016 CLINICAL DATA:  Dyspnea EXAM: PORTABLE CHEST 1 VIEW COMPARISON:  09/03/2016 FINDINGS: Stable scar-like opacities in the right more than left apex with hilar retraction and superior mediastinal distortion. Recent chest CT showing concerning soft tissue at the right hilum. No acute opacification. No edema, effusion, or pneumothorax. Normal heart size. Porta catheter on the right with tip at the SVC level. IMPRESSION: Post treatment chest without acute finding. Electronically Signed   By: Monte Fantasia M.D.   On: 09/04/2016 07:27   Oncology history: Patient was initially diagnosed with left upper lobe adenocarcinoma in 2014 and received 8 cycles of concurrent weekly carboplatinum and Taxol along with XRT between February 17, 2013 and Apr 07, 2013. He did not receive consolidation treatment at that time.  Patient was then placed on maintenance Tarceva. He then had a recurrence in his right upper lobe and subsequently underwent 4 cycles of cisplatin and pemetrexed between November 14, 2015 and January 16, 2016. Pemetrexed was subsequently discontinued secondary significant rash.   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 T756mmutation was not detected.  PLAN:    1.  Recurrent bilateral adenocarcinoma lung cancer:  Case discussed with pulmonology and pathology confirming recurrence of patient's adenocarcinoma. CT scan results from August 27, 2016 reviewed independently and reported as above.  After lengthy discussion with patient, he wishes to continue to pursue aggressive treatment and will initiate nivolumab. XRT is no longer an option at this point.  PDL-1 testing is pending at time of dictation. Return to clinic on September 16, 2016 to initiate cycle 1 of 6. Plan to give treatment every 2 weeks and then reimage at the conclusion of cycle 6.  2.  Rash:  Resolved.   Secondary to pemetrexed which has been discontinued.  3.  Hypomagnesemia: Resolved. 4.  Cough: Likely secondary to recurrence. 5.  Nausea: Patient does not complain of this today.  Continue Zofran and Compazine as needed. 6.  Constipation: Continue stool softeners and Miralax.  Lactulose as needed.  7.  Pain: Tramadol did not help and patient cannot take narcotics given side effects. Monitor.  Approximately 30 minutes was spent in discussion of which greater than 50% was consultation.  Patient expressed understanding and was in agreement with this plan. He also understands that He can call clinic at any time with any questions, concerns, or complaints.    TLloyd Huger MD   09/12/2016 8:28 AM

## 2016-09-09 ENCOUNTER — Encounter: Payer: Self-pay | Admitting: Pulmonary Disease

## 2016-09-09 ENCOUNTER — Ambulatory Visit (INDEPENDENT_AMBULATORY_CARE_PROVIDER_SITE_OTHER): Payer: Medicare Other | Admitting: Pulmonary Disease

## 2016-09-09 VITALS — BP 116/62 | HR 102 | Ht 69.0 in | Wt 173.0 lb

## 2016-09-09 DIAGNOSIS — R061 Stridor: Secondary | ICD-10-CM

## 2016-09-09 DIAGNOSIS — J9610 Chronic respiratory failure, unspecified whether with hypoxia or hypercapnia: Secondary | ICD-10-CM | POA: Diagnosis not present

## 2016-09-09 DIAGNOSIS — R0609 Other forms of dyspnea: Secondary | ICD-10-CM | POA: Diagnosis not present

## 2016-09-09 DIAGNOSIS — R062 Wheezing: Secondary | ICD-10-CM

## 2016-09-09 DIAGNOSIS — J700 Acute pulmonary manifestations due to radiation: Secondary | ICD-10-CM

## 2016-09-09 DIAGNOSIS — J9601 Acute respiratory failure with hypoxia: Secondary | ICD-10-CM

## 2016-09-09 DIAGNOSIS — C3490 Malignant neoplasm of unspecified part of unspecified bronchus or lung: Secondary | ICD-10-CM

## 2016-09-09 MED ORDER — PREDNISONE 10 MG PO TABS: 10.0000 mg | ORAL_TABLET | Freq: Every day | ORAL | 5 refills | Status: DC

## 2016-09-09 NOTE — Patient Instructions (Signed)
After completing current prednisone course, continue prednisone at 10 mg daily - I have placed prescription for this  We will work to obtain portable oxygen for you  Continue rest of your medications as previously  Follow up in 4 weeks

## 2016-09-10 ENCOUNTER — Inpatient Hospital Stay: Payer: Medicare Other

## 2016-09-10 ENCOUNTER — Inpatient Hospital Stay: Payer: Medicare Other | Attending: Oncology | Admitting: Oncology

## 2016-09-10 ENCOUNTER — Telehealth: Payer: Self-pay | Admitting: *Deleted

## 2016-09-10 VITALS — BP 118/80 | HR 109 | Temp 95.8°F | Resp 18 | Wt 172.0 lb

## 2016-09-10 DIAGNOSIS — C3492 Malignant neoplasm of unspecified part of left bronchus or lung: Secondary | ICD-10-CM | POA: Insufficient documentation

## 2016-09-10 DIAGNOSIS — R630 Anorexia: Secondary | ICD-10-CM | POA: Diagnosis not present

## 2016-09-10 DIAGNOSIS — Z923 Personal history of irradiation: Secondary | ICD-10-CM | POA: Insufficient documentation

## 2016-09-10 DIAGNOSIS — C3490 Malignant neoplasm of unspecified part of unspecified bronchus or lung: Secondary | ICD-10-CM

## 2016-09-10 DIAGNOSIS — K5900 Constipation, unspecified: Secondary | ICD-10-CM | POA: Diagnosis not present

## 2016-09-10 DIAGNOSIS — R061 Stridor: Secondary | ICD-10-CM | POA: Insufficient documentation

## 2016-09-10 DIAGNOSIS — C3491 Malignant neoplasm of unspecified part of right bronchus or lung: Secondary | ICD-10-CM | POA: Diagnosis not present

## 2016-09-10 DIAGNOSIS — Z9221 Personal history of antineoplastic chemotherapy: Secondary | ICD-10-CM | POA: Diagnosis not present

## 2016-09-10 DIAGNOSIS — R490 Dysphonia: Secondary | ICD-10-CM | POA: Diagnosis not present

## 2016-09-10 DIAGNOSIS — Z87891 Personal history of nicotine dependence: Secondary | ICD-10-CM | POA: Diagnosis not present

## 2016-09-10 DIAGNOSIS — Z85118 Personal history of other malignant neoplasm of bronchus and lung: Secondary | ICD-10-CM | POA: Insufficient documentation

## 2016-09-10 DIAGNOSIS — M25511 Pain in right shoulder: Secondary | ICD-10-CM | POA: Diagnosis not present

## 2016-09-10 DIAGNOSIS — Z79899 Other long term (current) drug therapy: Secondary | ICD-10-CM

## 2016-09-10 DIAGNOSIS — R0602 Shortness of breath: Secondary | ICD-10-CM | POA: Insufficient documentation

## 2016-09-10 DIAGNOSIS — G4700 Insomnia, unspecified: Secondary | ICD-10-CM | POA: Insufficient documentation

## 2016-09-10 DIAGNOSIS — M549 Dorsalgia, unspecified: Secondary | ICD-10-CM | POA: Insufficient documentation

## 2016-09-10 DIAGNOSIS — R202 Paresthesia of skin: Secondary | ICD-10-CM | POA: Insufficient documentation

## 2016-09-10 DIAGNOSIS — Z5112 Encounter for antineoplastic immunotherapy: Secondary | ICD-10-CM | POA: Insufficient documentation

## 2016-09-10 DIAGNOSIS — K219 Gastro-esophageal reflux disease without esophagitis: Secondary | ICD-10-CM | POA: Insufficient documentation

## 2016-09-10 DIAGNOSIS — R05 Cough: Secondary | ICD-10-CM | POA: Diagnosis not present

## 2016-09-10 DIAGNOSIS — F419 Anxiety disorder, unspecified: Secondary | ICD-10-CM | POA: Insufficient documentation

## 2016-09-10 NOTE — Telephone Encounter (Signed)
Informed per VO Dr Grayland Ormond to stop the Prednisone when he completes the 40 mg dosing

## 2016-09-10 NOTE — Telephone Encounter (Signed)
Dr Jamal Collin put him on Prednisone 40 mg which he will complete Saturday then he was to stay on 10 mg daily because he feels better with the prednisone on board, but he does not have to have the 10 mg daily if it is going to interfere with his immunotherapy he is to start next week. Please advise if he should take it or not.

## 2016-09-10 NOTE — Progress Notes (Signed)
States continues to have shortness of breath. Complains that teeth are brittle and are crumbling for the past 2 weeks and thinks it is related to taking prednisone. Dr. Alva Garnet recommended that pt be evaluated by dentist.

## 2016-09-10 NOTE — Progress Notes (Signed)
PROBLEMS: Recurrent lung cancer S/P bronch 09/03/16 - [facility] Hospitalized after bronchoscopy 09/27-09/28  SUBJ: Here to review bronch results. No new complaints. He continues to have severe DOE. Continues to have persistent stridor. Also reports that his teeth have become brittle and are breaking easily (which he attributes to his inhalers). Denies CP, fever, purulent sputum, hemoptysis, LE edema and calf tenderness. HE is currently completing a course of prednisone and believes that it improves his brreathing. He is scheduled to see Dr Grayland Ormond 10/04  OBJ: Vitals:   09/09/16 0854  BP: 116/62  Pulse: (!) 102  SpO2: 95%  Weight: 173 lb (78.5 kg)  Height: '5\' 9"'$  (1.753 m)   Ambulatory SpO2 87%   Gen: NAD @ rest, very dyspneic with minimal exertion HEENT: multiple small fractures of upper central incisors  Neck: NO LAN, no JVD noted Lungs: inspiratory and expiratory wheezes R>L Cardiovascular: Reg rate, normal rhythm, no M noted Abdomen: Soft, NT +BS Ext: no C/C/E Neuro: CNs intact, motor/sens grossly intact Skin: No lesions noted    DATA: Path results reviewed with patient  IMPRESSION: Recurrent non-small cell lung cancer Wheezing  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

## 2016-09-11 ENCOUNTER — Ambulatory Visit: Payer: Medicare Other | Admitting: Radiation Oncology

## 2016-09-11 DIAGNOSIS — C3490 Malignant neoplasm of unspecified part of unspecified bronchus or lung: Secondary | ICD-10-CM | POA: Diagnosis not present

## 2016-09-11 DIAGNOSIS — C3411 Malignant neoplasm of upper lobe, right bronchus or lung: Secondary | ICD-10-CM | POA: Diagnosis not present

## 2016-09-12 ENCOUNTER — Encounter: Payer: Self-pay | Admitting: Internal Medicine

## 2016-09-12 NOTE — Progress Notes (Signed)
START OFF PATHWAY REGIMEN - Non-Small Cell Lung  Off Pathway: Nivolumab 240 mg q14 Days  [id number]:Nivolumab 240 mg q14 Days:   A cycle is every 14 days:     Nivolumab (Opdivo(R)) 240 mg flat dose in 100 mL NS IV over 60 minutes. Inline filter required (low protein binding) Dose Mod: None Additional Orders: Severe immune-mediated reactions can occur (e.g. pneumonitis, colitis, and hepatitis). See prescribing information for more details including monitoring and required immediate management with steroids. Monitor thyroid, renal, liver  function tests, glucose, and sodium at baseline and periodically during therapy.  **Always confirm dose/schedule in your pharmacy ordering system**    Patient Characteristics: Stage IV and Local Recurrence AJCC M Stage: X AJCC N Stage: X AJCC T Stage: X Current Disease Status: Local Recurrence AJCC Stage Grouping: IV  Intent of Therapy: Non-Curative / Palliative Intent, Discussed with Patient

## 2016-09-14 NOTE — Progress Notes (Signed)
Christopher Huber  Telephone:(336) [phone] Fax:(336) [phone]  ID: Christopher Huber OB: 1946-07-01  MR#: [id number]  SKA#:[id number]  Patient Care Team: Venia Carbon, MD as PCP - General (Internal Medicine)  CHIEF COMPLAINT: Recurrent bilateral adenocarcinoma lung cancer.  INTERVAL HISTORY: Patient returns to clinic today for further evaluation and initiation of cycle 1 of nivolumab. He continues to have increased hoarseness of voice, cough, shortness of breath, and stridor. He otherwise feels well. He has no neurologic complaints. He denies any fevers. He has a fair appetite, but denies weight loss. He continues to have right shoulder pain. He does not complain of constipation today. He has difficulty sleeping. He denies any nausea, vomiting, constipation, or diarrhea. He has no urinary complaints. Patient offers no further specific complaints today.   REVIEW OF SYSTEMS:   Review of Systems  Constitutional: Negative for fever, malaise/fatigue and weight loss.  Respiratory: Positive for cough and stridor. Negative for hemoptysis, sputum production and shortness of breath.   Cardiovascular: Negative.  Negative for chest pain.  Gastrointestinal: Negative for constipation, nausea and vomiting.  Musculoskeletal: Positive for back pain and joint pain.  Skin: Negative for rash.  Neurological: Positive for tingling. Negative for weakness.  Psychiatric/Behavioral: The patient is nervous/anxious.     As per HPI. Otherwise, a complete review of systems is negative.  PAST MEDICAL HISTORY: Past Medical History:  Diagnosis Date  . Arthritis    right arm  . GERD (gastroesophageal reflux disease)   . Lung cancer ([facility] Huber) 2013   Rad tx's and chemo.     PAST SURGICAL HISTORY: Past Surgical History:  Procedure Laterality Date  . COLONOSCOPY    . ESOPHAGOGASTRODUODENOSCOPY    . FLEXIBLE BRONCHOSCOPY N/A 09/03/2016   Procedure: FLEXIBLE BRONCHOSCOPY;  Surgeon: Wilhelmina Mcardle, MD;   Location: [facility] ORS;  Service: Pulmonary;  Laterality: N/A;  . Left shoulder reconstruction  ~1965   after hunting accident    FAMILY HISTORY Family History  Problem Relation Age of Onset  . Heart disease Father   . Diabetes Neg Hx   . Cancer Neg Hx        ADVANCED DIRECTIVES:    HEALTH MAINTENANCE: Social History  Substance Use Topics  . Smoking status: Former Smoker    Packs/day: 1.00    Years: 15.00    Quit date: 06/27/1997  . Smokeless tobacco: Never Used  . Alcohol use No     Colonoscopy:  PAP:  Bone density:  Lipid panel:  Allergies  Allergen Reactions  . Pemetrexed Rash  . Codeine Nausea And Vomiting    Dizzy     Current Outpatient Prescriptions  Medication Sig Dispense Refill  . albuterol (PROVENTIL HFA;VENTOLIN HFA) 108 (90 Base) MCG/ACT inhaler Inhale 2 puffs into the lungs every 6 (six) hours as needed for wheezing or shortness of breath. 1 Inhaler 3  . Ascorbic Acid (VITAMIN C) 1000 MG tablet Take 1,000 mg by mouth daily.    . Cholecalciferol (VITAMIN D) 2000 UNITS CAPS Take 1 capsule by mouth daily.    . fluticasone-salmeterol (ADVAIR HFA) 115-21 MCG/ACT inhaler Inhale 2 puffs into the lungs 2 (two) times daily. 1 Inhaler 12  . GENERLAC 10 GM/15ML SOLN Reported on 03/05/2016  1  . HYDROcodone-homatropine (HYCODAN) 5-1.5 MG/5ML syrup 1 tsp po at night before bed prn cough 120 mL 0  . magnesium oxide (MAG-OX) 400 MG tablet Take 400 mg by mouth daily.    . Multiple Vitamin (MULTIVITAMIN WITH MINERALS) TABS Take 1  tablet by mouth daily.    Marland Kitchen omeprazole (PRILOSEC) 40 MG capsule Take 40 mg by mouth daily.     Marland Kitchen tiotropium (SPIRIVA) 18 MCG inhalation capsule Place 1 capsule (18 mcg total) into inhaler and inhale daily. 30 capsule 12  . traMADol (ULTRAM) 50 MG tablet Take 50 mg by mouth every 6 (six) hours as needed.    . vitamin E 400 UNIT capsule Take 400 Units by mouth daily.     No current facility-administered medications for this visit.     Facility-Administered Medications Ordered in Other Visits  Medication Dose Route Frequency Provider Last Rate Last Dose  . sodium chloride flush (NS) 0.9 % injection 10 mL  10 mL Intravenous PRN Lloyd Huger, MD   10 mL at 05/26/16 1056    OBJECTIVE: Vitals:   09/16/16 1007  BP: 93/61  Pulse: (!) 121  Resp: 18  Temp: (!) 95.5 F (35.3 C)     Body mass index is 25.08 kg/m.    ECOG FS:1 - Symptomatic but completely ambulatory  General: Well-developed, well-nourished, no acute distress. Eyes: anicteric sclera. Lungs: Stridor, diminished right breath sounds. Heart: Regular rate and rhythm. No rubs, murmurs, or gallops. Abdomen: Soft, nontender, nondistended. No organomegaly noted, normoactive bowel sounds. Musculoskeletal: No edema, cyanosis, or clubbing. Neuro: Alert, answering all questions appropriately. Cranial nerves grossly intact. Skin: Rash has resolved. Psych: Normal affect.  LAB RESULTS:  Lab Results  Component Value Date   NA 131 (L) 09/16/2016   K 4.1 09/16/2016   CL 96 (L) 09/16/2016   CO2 27 09/16/2016   GLUCOSE 92 09/16/2016   BUN 15 09/16/2016   CREATININE 0.92 09/16/2016   CALCIUM 8.8 (L) 09/16/2016   PROT 6.5 09/16/2016   ALBUMIN 3.5 09/16/2016   AST 15 09/16/2016   ALT 14 (L) 09/16/2016   ALKPHOS 64 09/16/2016   BILITOT 0.8 09/16/2016   GFRNONAA >60 09/16/2016   GFRAA >60 09/16/2016    Lab Results  Component Value Date   WBC 11.3 (H) 09/16/2016   NEUTROABS 9.7 (H) 09/16/2016   HGB 13.7 09/16/2016   HCT 40.4 09/16/2016   MCV 79.1 (L) 09/16/2016   PLT 362 09/16/2016     STUDIES: X-ray Chest Pa Or Ap  Result Date: 09/03/2016 CLINICAL DATA:  Status post bronchoscopy. Continuous cough currently. History of bilateral lung malignancy with new right-sided opacities. EXAM: CHEST 1 VIEW COMPARISON:  PA and lateral chest x-ray of August 26, 2016 and CT scan of the chest of August 27, 2016. FINDINGS: The left lung remains hyperinflated  and largely clear. Fibrotic changes in the upper lobe are stable. On the right there is confluent increased density in the upper lobe not greatly changed from the previous study. There is no pneumothorax or pleural effusion. The hilar structures are retracted superiorly the heart is normal in size. The pulmonary vascularity is not engorged. The Port-A-Cath tip projects over the midportion of the SVC. The bony thorax exhibits no acute abnormality. IMPRESSION: No postprocedure pneumothorax, hemothorax, or pneumomediastinum. Persistent parenchymal consolidation in the upper lobes greatest on the right. Electronically Signed   By: David  Martinique M.D.   On: 09/03/2016 16:06   Dg Chest 2 View  Result Date: 08/26/2016 CLINICAL DATA:  Spoke with pt who states since being seen last is breathing has gone down hill, pt c/o increased sob, prod cough with white mucus, wheezing, chest discomfort. Pt denies any fever, sweats or chills. Pt on 2L 02 qsh, pt states he went  to the restroom and came back to the living room and checked his 02 level, pt was sating at 83. Pt currently wearing 2L 02 to get his 02 up. EXAM: CHEST  2 VIEW COMPARISON:  04/23/2016 FINDINGS: There is increased opacity extending from the right hilum to the right upper lobe. There is greater retraction of the minor fissure superiorly. Pleural thickening overlying the right upper lobe is stable. Scarring in the left upper lobe is stable. There are no other new areas of lung opacity. No pleural effusion.  No pneumothorax. Cardiac silhouette is normal in size. No convincing mediastinal masses. No left hilar mass or adenopathy. Right hilum is partly obscured by the contiguous right upper lobe opacity. Right anterior chest wall Port-A-Cath is stable and well positioned. Skeletal structures show no all osteolytic or osteoblastic lesions. IMPRESSION: 1. Increased opacity has developed extending from the right hilum into the right upper lobe. Given the history,  suspect pneumonia superimposed on chronic changes of radiation induced fibrosis. Electronically Signed   By: Lajean Manes M.D.   On: 08/26/2016 10:31   Ct Chest W Contrast  Result Date: 08/27/2016 CLINICAL DATA:  Left upper lobe lung cancer diagnosed in 2014 status post radiation therapy and chemotherapy, with right upper lobe recurrence treated with radiation therapy and chemotherapy, presents with stridor and increased right upper lung opacity on recent chest radiograph. EXAM: CT CHEST WITH CONTRAST TECHNIQUE: Multidetector CT imaging of the chest was performed during intravenous contrast administration. CONTRAST:  39m ISOVUE-300 IOPAMIDOL (ISOVUE-300) INJECTION 61% COMPARISON:  08/26/2016 chest radiograph. 05/07/2016 chest CT. 02/04/2016 PET-CT. FINDINGS: Cardiovascular: Normal heart size. Moderate pericardial effusion measuring up to 13 mm in thickness anteriorly, increased from 0.7 cm. Right internal jugular MediPort terminates at the cavoatrial junction. Left anterior descending and right coronary atherosclerosis. Atherosclerotic nonaneurysmal thoracic aorta. Normal caliber pulmonary arteries. No central pulmonary emboli. Mediastinum/Nodes: No discrete thyroid nodules. Unremarkable esophagus. No axillary adenopathy. No pathologically enlarged mediastinal or left hilar nodes. There is new infiltrative right hilar soft tissue attenuation measuring up to 1.5 cm in thickness (series 2/ image 66), increased from 0.8 cm, associated with new occlusion of the right middle lobe bronchus. Lungs/Pleura: No pneumothorax. New small right pleural effusion. No left pleural effusion. There is stable masslike fibrosis, distortion and volume loss in the central/ posterior left upper lobe. There is extensive new patchy consolidation, ground-glass opacity and reticulation throughout the central right lung predominantly involving the superior segment right lower lobe and basilar right upper lobe, most suggestive of new  radiation change. These findings are superimposed on prior areas of masslike fibrosis in the right upper lobe, for example a 3.2 x 1.1 cm apical right upper lobe focus of consolidation (series 3/image 22), which previously measured 3.2 x 1.2 cm on 05/07/2016 using similar measurement technique, unchanged. No acute consolidative airspace disease or new significant pulmonary nodules in the left lung. Stable small calcified pleural plaque on the right. Upper abdomen: Hypodense 1.2 cm renal cortical lesion in the posterior upper left kidney (series 2/ image 163), previously 1.1 cm, not definitely changed. Musculoskeletal: No aggressive appearing focal osseous lesions. Mild thoracic spondylosis . IMPRESSION: 1. New extensive patchy consolidation and ground-glass attenuation in the superior segment right lower lobe and right upper lobe, most suggestive of new radiation change. This finding is superimposed on stable prior areas of radiation fibrosis in the right upper lobe. 2. New infiltrative soft tissue attenuation throughout the right hilum, worrisome for progression of infiltrative metastatic right hilar adenopathy. This  finding is associated with new occlusion of the right middle lobe bronchus, which may account for the right-sided stridor. 3. No additional sites suspicious for metastatic disease in the chest. Stable radiation fibrosis in the left upper lobe. 4. Moderate pericardial effusion is increased. New small right pleural effusion. 5. Indeterminate small renal cortical lesion in the upper left kidney, not definitely changed, cannot exclude renal cell carcinoma. 6. Aortic atherosclerosis.  Two-vessel coronary atherosclerosis. These results will be called to the ordering clinician or representative by the Radiologist Assistant, and communication documented in the PACS or zVision Dashboard. Electronically Signed   By: Ilona Sorrel M.D.   On: 08/27/2016 16:12   Dg Chest Port 1 View  Result Date:  09/04/2016 CLINICAL DATA:  Dyspnea EXAM: PORTABLE CHEST 1 VIEW COMPARISON:  09/03/2016 FINDINGS: Stable scar-like opacities in the right more than left apex with hilar retraction and superior mediastinal distortion. Recent chest CT showing concerning soft tissue at the right hilum. No acute opacification. No edema, effusion, or pneumothorax. Normal heart size. Porta catheter on the right with tip at the SVC level. IMPRESSION: Post treatment chest without acute finding. Electronically Signed   By: Monte Fantasia M.D.   On: 09/04/2016 07:27   Oncology history: Patient was initially diagnosed with left upper lobe adenocarcinoma in 2014 and received 8 cycles of concurrent weekly carboplatinum and Taxol along with XRT between February 17, 2013 and Apr 07, 2013. He did not receive consolidation treatment at that time.  Patient was then placed on maintenance Tarceva. He then had a recurrence in his right upper lobe and subsequently underwent 4 cycles of cisplatin and pemetrexed between November 14, 2015 and January 16, 2016. Pemetrexed was subsequently discontinued secondary significant rash.   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 T73mmutation was not detected.  PLAN:    1.  Recurrent bilateral adenocarcinoma lung cancer:  Case discussed with pulmonology and pathology confirming recurrence of patient's adenocarcinoma. CT scan results from August 27, 2016 reviewed independently and reported as above.  After lengthy discussion with patient, he wishes to continue to pursue aggressive treatment. Proceed with cycle 1 of 6 of nivolumab today. XRT is no longer an option at this point.  PDL-1 testing is pending at time of dictation. Return to clinic on 2 weeks for consideration of cycle 2. Plan to give treatment every 2 weeks and then reimage at the conclusion of cycle 6.  2.  Rash:  Resolved.  Secondary to pemetrexed which has been  discontinued.  3.  Hypomagnesemia: Resolved. 4.  Cough: Likely secondary to recurrence. 5.  Nausea: Patient does not complain of this today.  Continue Zofran and Compazine as needed. 6.  Constipation: Continue stool softeners and Miralax.  Lactulose as needed.  7.  Pain: Tramadol did not help and patient cannot take narcotics given side effects. Monitor.  Approximately 30 minutes was spent in discussion of which greater than 50% was consultation.  Patient expressed understanding and was in agreement with this plan. He also understands that He can call clinic at any time with any questions, concerns, or complaints.    TLloyd Huger MD   09/21/2016 7:27 AM

## 2016-09-15 ENCOUNTER — Ambulatory Visit: Payer: Self-pay

## 2016-09-16 ENCOUNTER — Inpatient Hospital Stay (HOSPITAL_BASED_OUTPATIENT_CLINIC_OR_DEPARTMENT_OTHER): Payer: Medicare Other | Admitting: Oncology

## 2016-09-16 ENCOUNTER — Inpatient Hospital Stay: Payer: Medicare Other

## 2016-09-16 VITALS — BP 93/61 | HR 121 | Temp 95.5°F | Resp 18 | Wt 169.9 lb

## 2016-09-16 DIAGNOSIS — Z87891 Personal history of nicotine dependence: Secondary | ICD-10-CM | POA: Diagnosis not present

## 2016-09-16 DIAGNOSIS — M25511 Pain in right shoulder: Secondary | ICD-10-CM | POA: Diagnosis not present

## 2016-09-16 DIAGNOSIS — R0602 Shortness of breath: Secondary | ICD-10-CM

## 2016-09-16 DIAGNOSIS — R05 Cough: Secondary | ICD-10-CM | POA: Diagnosis not present

## 2016-09-16 DIAGNOSIS — C3491 Malignant neoplasm of unspecified part of right bronchus or lung: Secondary | ICD-10-CM | POA: Diagnosis not present

## 2016-09-16 DIAGNOSIS — Z85118 Personal history of other malignant neoplasm of bronchus and lung: Secondary | ICD-10-CM | POA: Diagnosis not present

## 2016-09-16 DIAGNOSIS — G4700 Insomnia, unspecified: Secondary | ICD-10-CM | POA: Diagnosis not present

## 2016-09-16 DIAGNOSIS — R061 Stridor: Secondary | ICD-10-CM | POA: Diagnosis not present

## 2016-09-16 DIAGNOSIS — M549 Dorsalgia, unspecified: Secondary | ICD-10-CM | POA: Diagnosis not present

## 2016-09-16 DIAGNOSIS — K5900 Constipation, unspecified: Secondary | ICD-10-CM | POA: Diagnosis not present

## 2016-09-16 DIAGNOSIS — F419 Anxiety disorder, unspecified: Secondary | ICD-10-CM

## 2016-09-16 DIAGNOSIS — Z9221 Personal history of antineoplastic chemotherapy: Secondary | ICD-10-CM

## 2016-09-16 DIAGNOSIS — C3492 Malignant neoplasm of unspecified part of left bronchus or lung: Secondary | ICD-10-CM | POA: Diagnosis not present

## 2016-09-16 DIAGNOSIS — Z923 Personal history of irradiation: Secondary | ICD-10-CM

## 2016-09-16 DIAGNOSIS — Z79899 Other long term (current) drug therapy: Secondary | ICD-10-CM

## 2016-09-16 DIAGNOSIS — R202 Paresthesia of skin: Secondary | ICD-10-CM | POA: Diagnosis not present

## 2016-09-16 DIAGNOSIS — C3490 Malignant neoplasm of unspecified part of unspecified bronchus or lung: Secondary | ICD-10-CM

## 2016-09-16 DIAGNOSIS — R490 Dysphonia: Secondary | ICD-10-CM | POA: Diagnosis not present

## 2016-09-16 DIAGNOSIS — Z5112 Encounter for antineoplastic immunotherapy: Secondary | ICD-10-CM | POA: Diagnosis not present

## 2016-09-16 DIAGNOSIS — K219 Gastro-esophageal reflux disease without esophagitis: Secondary | ICD-10-CM | POA: Diagnosis not present

## 2016-09-16 LAB — CBC WITH DIFFERENTIAL/PLATELET
BASOS PCT: 0 %
Basophils Absolute: 0 10*3/uL (ref 0–0.1)
EOS ABS: 0.1 10*3/uL (ref 0–0.7)
EOS PCT: 1 %
HCT: 40.4 % (ref 40.0–52.0)
Hemoglobin: 13.7 g/dL (ref 13.0–18.0)
Lymphocytes Relative: 4 %
Lymphs Abs: 0.4 10*3/uL — ABNORMAL LOW (ref 1.0–3.6)
MCH: 26.8 pg (ref 26.0–34.0)
MCHC: 33.9 g/dL (ref 32.0–36.0)
MCV: 79.1 fL — ABNORMAL LOW (ref 80.0–100.0)
MONO ABS: 0.9 10*3/uL (ref 0.2–1.0)
MONOS PCT: 8 %
Neutro Abs: 9.7 10*3/uL — ABNORMAL HIGH (ref 1.4–6.5)
Neutrophils Relative %: 87 %
PLATELETS: 362 10*3/uL (ref 150–440)
RBC: 5.11 MIL/uL (ref 4.40–5.90)
RDW: 14.6 % — AB (ref 11.5–14.5)
WBC: 11.3 10*3/uL — ABNORMAL HIGH (ref 3.8–10.6)

## 2016-09-16 LAB — COMPREHENSIVE METABOLIC PANEL
ALBUMIN: 3.5 g/dL (ref 3.5–5.0)
ALT: 14 U/L — ABNORMAL LOW (ref 17–63)
ANION GAP: 8 (ref 5–15)
AST: 15 U/L (ref 15–41)
Alkaline Phosphatase: 64 U/L (ref 38–126)
BILIRUBIN TOTAL: 0.8 mg/dL (ref 0.3–1.2)
BUN: 15 mg/dL (ref 6–20)
CALCIUM: 8.8 mg/dL — AB (ref 8.9–10.3)
CO2: 27 mmol/L (ref 22–32)
CREATININE: 0.92 mg/dL (ref 0.61–1.24)
Chloride: 96 mmol/L — ABNORMAL LOW (ref 101–111)
GFR calc Af Amer: >60 mL/min (ref >60)
GFR calc non Af Amer: >60 mL/min (ref >60)
GLUCOSE: 92 mg/dL (ref 65–99)
POTASSIUM: 4.1 mmol/L (ref 3.5–5.1)
SODIUM: 131 mmol/L — AB (ref 135–145)
TOTAL PROTEIN: 6.5 g/dL (ref 6.5–8.1)

## 2016-09-16 MED ORDER — SODIUM CHLORIDE 0.9% FLUSH
10.0000 mL | INTRAVENOUS | Status: DC | PRN
  Administered 2016-09-16: 10 mL via INTRAVENOUS
  Filled 2016-09-16: qty 10

## 2016-09-16 MED ORDER — SODIUM CHLORIDE 0.9 % IV SOLN
Freq: Once | INTRAVENOUS | Status: AC
  Administered 2016-09-16: 11:00:00 via INTRAVENOUS
  Filled 2016-09-16: qty 1000

## 2016-09-16 MED ORDER — NIVOLUMAB CHEMO INJECTION 100 MG/10ML
240.0000 mg | Freq: Once | INTRAVENOUS | Status: AC
  Administered 2016-09-16: 240 mg via INTRAVENOUS
  Filled 2016-09-16: qty 20

## 2016-09-16 MED ORDER — HEPARIN SOD (PORK) LOCK FLUSH 100 UNIT/ML IV SOLN
500.0000 [IU] | Freq: Once | INTRAVENOUS | Status: AC
  Administered 2016-09-16: 500 [IU] via INTRAVENOUS
  Filled 2016-09-16: qty 5

## 2016-09-16 NOTE — Telephone Encounter (Signed)
xxx

## 2016-09-16 NOTE — Progress Notes (Signed)
States has shortness of breath with exertion. Ready to start treatments today.

## 2016-09-17 ENCOUNTER — Ambulatory Visit: Payer: Self-pay

## 2016-09-23 ENCOUNTER — Ambulatory Visit: Payer: Self-pay | Admitting: Internal Medicine

## 2016-09-24 LAB — SURGICAL PATHOLOGY

## 2016-09-25 ENCOUNTER — Encounter: Payer: Self-pay | Admitting: Oncology

## 2016-09-29 NOTE — Progress Notes (Signed)
[place]  Telephone:(336) [phone] Fax:(336) [phone]  ID: Christopher Huber OB: [birth year]  MR#: [id number]  CBU#:[id number]  Patient Care Team: Venia Carbon, MD as PCP - General (Internal Medicine)  CHIEF COMPLAINT: Recurrent bilateral adenocarcinoma lung cancer.  INTERVAL HISTORY: Patient returns to clinic today for further evaluation and consideration of cycle 2 of nivolumab. He tolerated his first treatment well without significant side effects. He continues to have increased hoarseness of voice, cough, shortness of breath, and stridor. He otherwise feels well. He has no neurologic complaints. He denies any fevers. He has a poor appetite, but denies weight loss. He continues to have right shoulder pain. He does not complain of constipation today. He has difficulty sleeping. He denies any nausea, vomiting, constipation, or diarrhea. He has no urinary complaints. Patient offers no further specific complaints today.   REVIEW OF SYSTEMS:   Review of Systems  Constitutional: Negative for fever, malaise/fatigue and weight loss.  Respiratory: Positive for cough and stridor. Negative for hemoptysis, sputum production and shortness of breath.   Cardiovascular: Negative.  Negative for chest pain.  Gastrointestinal: Negative for constipation, nausea and vomiting.  Musculoskeletal: Positive for back pain and joint pain.  Skin: Negative for rash.  Neurological: Positive for tingling. Negative for weakness.  Psychiatric/Behavioral: The patient is nervous/anxious.    As per HPI. Otherwise, a complete review of systems is negative.   PAST MEDICAL HISTORY: Past Medical History:  Diagnosis Date  . Arthritis    right arm  . GERD (gastroesophageal reflux disease)   . Lung cancer ([place]) 2013   Rad tx's and chemo.     PAST SURGICAL HISTORY: Past Surgical History:  Procedure Laterality Date  . COLONOSCOPY    . ESOPHAGOGASTRODUODENOSCOPY    . FLEXIBLE BRONCHOSCOPY N/A  09/03/2016   Procedure: FLEXIBLE BRONCHOSCOPY;  Surgeon: Wilhelmina Mcardle, MD;  Location: [facility] ORS;  Service: Pulmonary;  Laterality: N/A;  . Left shoulder reconstruction  ~1965   after hunting accident    FAMILY HISTORY Family History  Problem Relation Age of Onset  . Heart disease Father   . Diabetes Neg Hx   . Cancer Neg Hx        ADVANCED DIRECTIVES:    HEALTH MAINTENANCE: Social History  Substance Use Topics  . Smoking status: Former Smoker    Packs/day: 1.00    Years: 15.00    Quit date: 06/27/1997  . Smokeless tobacco: Never Used  . Alcohol use No     Colonoscopy:  PAP:  Bone density:  Lipid panel:  Allergies  Allergen Reactions  . Pemetrexed Rash  . Codeine Nausea And Vomiting    Dizzy     Current Outpatient Prescriptions  Medication Sig Dispense Refill  . albuterol (PROVENTIL HFA;VENTOLIN HFA) 108 (90 Base) MCG/ACT inhaler Inhale 2 puffs into the lungs every 6 (six) hours as needed for wheezing or shortness of breath. 1 Inhaler 3  . Ascorbic Acid (VITAMIN C) 1000 MG tablet Take 1,000 mg by mouth daily.    . Cholecalciferol (VITAMIN D) 2000 UNITS CAPS Take 1 capsule by mouth daily.    . fluticasone-salmeterol (ADVAIR HFA) 115-21 MCG/ACT inhaler Inhale 2 puffs into the lungs 2 (two) times daily. 1 Inhaler 12  . GENERLAC 10 GM/15ML SOLN Reported on 03/05/2016  1  . HYDROcodone-homatropine (HYCODAN) 5-1.5 MG/5ML syrup Take 5 mLs by mouth every 4 (four) hours as needed for cough. 120 mL 0  . magnesium oxide (MAG-OX) 400 MG tablet Take 400 mg by  mouth daily.    . Multiple Vitamin (MULTIVITAMIN WITH MINERALS) TABS Take 1 tablet by mouth daily.    Marland Kitchen omeprazole (PRILOSEC) 40 MG capsule Take 40 mg by mouth daily.     Marland Kitchen tiotropium (SPIRIVA) 18 MCG inhalation capsule Place 1 capsule (18 mcg total) into inhaler and inhale daily. 30 capsule 12  . traMADol (ULTRAM) 50 MG tablet Take 50 mg by mouth every 6 (six) hours as needed.    . vitamin E 400 UNIT capsule Take 400  Units by mouth daily.    . megestrol (MEGACE) 40 MG tablet Take 1 tablet (40 mg total) by mouth daily. 30 tablet 2   No current facility-administered medications for this visit.    Facility-Administered Medications Ordered in Other Visits  Medication Dose Route Frequency Provider Last Rate Last Dose  . sodium chloride flush (NS) 0.9 % injection 10 mL  10 mL Intravenous PRN Lloyd Huger, MD   10 mL at 05/26/16 1056    OBJECTIVE: Vitals:   09/30/16 1102  BP: 108/74  Pulse: (!) 114  Resp: 18  Temp: 97.6 F (36.4 C)     Body mass index is 24.66 kg/m.    ECOG FS:1 - Symptomatic but completely ambulatory  General: Well-developed, well-nourished, no acute distress. Eyes: anicteric sclera. Lungs: Stridor, diminished right breath sounds. Heart: Regular rate and rhythm. No rubs, murmurs, or gallops. Abdomen: Soft, nontender, nondistended. No organomegaly noted, normoactive bowel sounds. Musculoskeletal: No edema, cyanosis, or clubbing. Neuro: Alert, answering all questions appropriately. Cranial nerves grossly intact. Skin: Rash has resolved. Psych: Normal affect.  LAB RESULTS:  Lab Results  Component Value Date   NA 134 (L) 09/30/2016   K 3.4 (L) 09/30/2016   CL 97 (L) 09/30/2016   CO2 27 09/30/2016   GLUCOSE 110 (H) 09/30/2016   BUN 12 09/30/2016   CREATININE 0.83 09/30/2016   CALCIUM 8.8 (L) 09/30/2016   PROT 6.5 09/30/2016   ALBUMIN 3.2 (L) 09/30/2016   AST 15 09/30/2016   ALT 10 (L) 09/30/2016   ALKPHOS 71 09/30/2016   BILITOT 0.6 09/30/2016   GFRNONAA >60 09/30/2016   GFRAA >60 09/30/2016    Lab Results  Component Value Date   WBC 5.5 09/30/2016   NEUTROABS 4.2 09/30/2016   HGB 12.3 (L) 09/30/2016   HCT 36.0 (L) 09/30/2016   MCV 78.2 (L) 09/30/2016   PLT 384 09/30/2016     STUDIES: No results found. Oncology history: Patient was initially diagnosed with left upper lobe adenocarcinoma in 2014 and received 8 cycles of concurrent weekly carboplatinum  and Taxol along with XRT between February 17, 2013 and Apr 07, 2013. He did not receive consolidation treatment at that time.  Patient was then placed on maintenance Tarceva. He then had a recurrence in his right upper lobe and subsequently underwent 4 cycles of cisplatin and pemetrexed between November 14, 2015 and January 16, 2016. Pemetrexed was subsequently discontinued secondary significant rash.  Patient initiated nivolumab on September 16, 2016.   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 T72mmutation was not detected.  PLAN:    1.  Recurrent bilateral adenocarcinoma lung cancer:  Case discussed with pulmonology and pathology confirming recurrence of patient's adenocarcinoma. CT scan results from August 27, 2016 reviewed independently and reported as above.  After lengthy discussion with patient, he wishes to continue to pursue aggressive treatment. Proceed with cycle 2 of 6 of nivolumab today. XRT is no longer an option  at this point. PDL-1 testing is pending at time of dictation. Return to clinic on 2 weeks for consideration of cycle 3. Plan to give treatment every 2 weeks and then reimage at the conclusion of cycle 6.  2.  Rash:  Resolved.  Secondary to pemetrexed which has been discontinued.  3.  Hypomagnesemia: Resolved. 4.  Cough: Likely secondary to recurrence. 5.  Nausea: Patient does not complain of this today.  Continue Zofran and Compazine as needed. 6.  Constipation: Continue stool softeners and Miralax.  Lactulose as needed.  7.  Pain: Tramadol did not help and patient cannot take narcotics given side effects. Monitor. 8.  Poor appetite: Patient was given a prescription for Megace today.  Approximately 30 minutes was spent in discussion of which greater than 50% was consultation.  Patient expressed understanding and was in agreement with this plan. He also understands that He can call clinic at any time with  any questions, concerns, or complaints.    Lloyd Huger, MD   10/05/2016 8:40 AM

## 2016-09-30 ENCOUNTER — Inpatient Hospital Stay: Payer: Medicare Other

## 2016-09-30 ENCOUNTER — Inpatient Hospital Stay (HOSPITAL_BASED_OUTPATIENT_CLINIC_OR_DEPARTMENT_OTHER): Payer: Medicare Other | Admitting: Oncology

## 2016-09-30 VITALS — BP 108/74 | HR 114 | Temp 97.6°F | Resp 18 | Wt 167.0 lb

## 2016-09-30 DIAGNOSIS — G4700 Insomnia, unspecified: Secondary | ICD-10-CM

## 2016-09-30 DIAGNOSIS — R630 Anorexia: Secondary | ICD-10-CM

## 2016-09-30 DIAGNOSIS — C3491 Malignant neoplasm of unspecified part of right bronchus or lung: Secondary | ICD-10-CM

## 2016-09-30 DIAGNOSIS — R490 Dysphonia: Secondary | ICD-10-CM | POA: Diagnosis not present

## 2016-09-30 DIAGNOSIS — C3492 Malignant neoplasm of unspecified part of left bronchus or lung: Secondary | ICD-10-CM | POA: Diagnosis not present

## 2016-09-30 DIAGNOSIS — R05 Cough: Secondary | ICD-10-CM | POA: Diagnosis not present

## 2016-09-30 DIAGNOSIS — C3490 Malignant neoplasm of unspecified part of unspecified bronchus or lung: Secondary | ICD-10-CM

## 2016-09-30 DIAGNOSIS — Z79899 Other long term (current) drug therapy: Secondary | ICD-10-CM | POA: Diagnosis not present

## 2016-09-30 DIAGNOSIS — K219 Gastro-esophageal reflux disease without esophagitis: Secondary | ICD-10-CM | POA: Diagnosis not present

## 2016-09-30 DIAGNOSIS — R0602 Shortness of breath: Secondary | ICD-10-CM | POA: Diagnosis not present

## 2016-09-30 DIAGNOSIS — M25511 Pain in right shoulder: Secondary | ICD-10-CM | POA: Diagnosis not present

## 2016-09-30 DIAGNOSIS — Z87891 Personal history of nicotine dependence: Secondary | ICD-10-CM | POA: Diagnosis not present

## 2016-09-30 DIAGNOSIS — Z9221 Personal history of antineoplastic chemotherapy: Secondary | ICD-10-CM | POA: Diagnosis not present

## 2016-09-30 DIAGNOSIS — R061 Stridor: Secondary | ICD-10-CM

## 2016-09-30 DIAGNOSIS — Z923 Personal history of irradiation: Secondary | ICD-10-CM | POA: Diagnosis not present

## 2016-09-30 DIAGNOSIS — M549 Dorsalgia, unspecified: Secondary | ICD-10-CM | POA: Diagnosis not present

## 2016-09-30 DIAGNOSIS — Z85118 Personal history of other malignant neoplasm of bronchus and lung: Secondary | ICD-10-CM | POA: Diagnosis not present

## 2016-09-30 DIAGNOSIS — R202 Paresthesia of skin: Secondary | ICD-10-CM | POA: Diagnosis not present

## 2016-09-30 DIAGNOSIS — Z5112 Encounter for antineoplastic immunotherapy: Secondary | ICD-10-CM | POA: Diagnosis not present

## 2016-09-30 DIAGNOSIS — K5900 Constipation, unspecified: Secondary | ICD-10-CM

## 2016-09-30 DIAGNOSIS — F419 Anxiety disorder, unspecified: Secondary | ICD-10-CM

## 2016-09-30 LAB — COMPREHENSIVE METABOLIC PANEL
ALBUMIN: 3.2 g/dL — AB (ref 3.5–5.0)
ALK PHOS: 71 U/L (ref 38–126)
ALT: 10 U/L — ABNORMAL LOW (ref 17–63)
AST: 15 U/L (ref 15–41)
Anion gap: 10 (ref 5–15)
BILIRUBIN TOTAL: 0.6 mg/dL (ref 0.3–1.2)
BUN: 12 mg/dL (ref 6–20)
CHLORIDE: 97 mmol/L — AB (ref 101–111)
CO2: 27 mmol/L (ref 22–32)
CREATININE: 0.83 mg/dL (ref 0.61–1.24)
Calcium: 8.8 mg/dL — ABNORMAL LOW (ref 8.9–10.3)
Glucose, Bld: 110 mg/dL — ABNORMAL HIGH (ref 65–99)
POTASSIUM: 3.4 mmol/L — AB (ref 3.5–5.1)
Sodium: 134 mmol/L — ABNORMAL LOW (ref 135–145)
Total Protein: 6.5 g/dL (ref 6.5–8.1)

## 2016-09-30 LAB — CBC WITH DIFFERENTIAL/PLATELET
Basophils Absolute: 0 10*3/uL (ref 0–0.1)
Basophils Relative: 1 %
Eosinophils Absolute: 0.2 10*3/uL (ref 0–0.7)
Eosinophils Relative: 3 %
HEMATOCRIT: 36 % — AB (ref 40.0–52.0)
HEMOGLOBIN: 12.3 g/dL — AB (ref 13.0–18.0)
LYMPHS ABS: 0.4 10*3/uL — AB (ref 1.0–3.6)
Lymphocytes Relative: 8 %
MCH: 26.8 pg (ref 26.0–34.0)
MCHC: 34.2 g/dL (ref 32.0–36.0)
MCV: 78.2 fL — AB (ref 80.0–100.0)
MONO ABS: 0.7 10*3/uL (ref 0.2–1.0)
MONOS PCT: 13 %
NEUTROS ABS: 4.2 10*3/uL (ref 1.4–6.5)
NEUTROS PCT: 75 %
Platelets: 384 10*3/uL (ref 150–440)
RBC: 4.6 MIL/uL (ref 4.40–5.90)
RDW: 14.6 % — ABNORMAL HIGH (ref 11.5–14.5)
WBC: 5.5 10*3/uL (ref 3.8–10.6)

## 2016-09-30 LAB — TSH: TSH: 6.738 u[IU]/mL — ABNORMAL HIGH (ref 0.350–4.500)

## 2016-09-30 MED ORDER — SODIUM CHLORIDE 0.9 % IV SOLN
240.0000 mg | Freq: Once | INTRAVENOUS | Status: AC
  Administered 2016-09-30: 240 mg via INTRAVENOUS
  Filled 2016-09-30: qty 24

## 2016-09-30 MED ORDER — SODIUM CHLORIDE 0.9% FLUSH
10.0000 mL | Freq: Once | INTRAVENOUS | Status: AC
  Administered 2016-09-30: 10 mL via INTRAVENOUS
  Filled 2016-09-30: qty 10

## 2016-09-30 MED ORDER — MEGESTROL ACETATE 40 MG PO TABS: 40.0000 mg | ORAL_TABLET | Freq: Every day | ORAL | 2 refills | Status: AC

## 2016-09-30 MED ORDER — HYDROCODONE-HOMATROPINE 5-1.5 MG/5ML PO SYRP: 5.0000 mL | ORAL_SOLUTION | ORAL | 0 refills | Status: DC | PRN

## 2016-09-30 MED ORDER — HEPARIN SOD (PORK) LOCK FLUSH 100 UNIT/ML IV SOLN
500.0000 [IU] | Freq: Once | INTRAVENOUS | Status: AC
  Administered 2016-09-30: 500 [IU] via INTRAVENOUS
  Filled 2016-09-30: qty 5

## 2016-09-30 MED ORDER — SODIUM CHLORIDE 0.9 % IV SOLN
Freq: Once | INTRAVENOUS | Status: AC
  Administered 2016-09-30: 12:00:00 via INTRAVENOUS
  Filled 2016-09-30: qty 1000

## 2016-09-30 NOTE — Progress Notes (Signed)
States continues to have shortness of breath with exertion and fatigue. Decreased appetite and requests prescription to simulate appetite. Would like refill of cough syrup as well.

## 2016-10-01 LAB — T4: T4 TOTAL: 7.9 ug/dL (ref 4.5–12.0)

## 2016-10-10 ENCOUNTER — Encounter: Payer: Self-pay | Admitting: Pulmonary Disease

## 2016-10-10 ENCOUNTER — Ambulatory Visit (INDEPENDENT_AMBULATORY_CARE_PROVIDER_SITE_OTHER): Payer: Medicare Other | Admitting: Pulmonary Disease

## 2016-10-10 VITALS — BP 118/90 | HR 127

## 2016-10-10 DIAGNOSIS — C3490 Malignant neoplasm of unspecified part of unspecified bronchus or lung: Secondary | ICD-10-CM | POA: Diagnosis not present

## 2016-10-10 DIAGNOSIS — R058 Other specified cough: Secondary | ICD-10-CM

## 2016-10-10 DIAGNOSIS — R061 Stridor: Secondary | ICD-10-CM

## 2016-10-10 DIAGNOSIS — R634 Abnormal weight loss: Secondary | ICD-10-CM

## 2016-10-10 DIAGNOSIS — R0609 Other forms of dyspnea: Secondary | ICD-10-CM | POA: Diagnosis not present

## 2016-10-10 DIAGNOSIS — R05 Cough: Secondary | ICD-10-CM

## 2016-10-10 DIAGNOSIS — J9610 Chronic respiratory failure, unspecified whether with hypoxia or hypercapnia: Secondary | ICD-10-CM | POA: Diagnosis not present

## 2016-10-10 NOTE — Patient Instructions (Addendum)
Continue current inhaler medications as prescribed   Follow up in 6-8 weeks

## 2016-10-12 DIAGNOSIS — C3490 Malignant neoplasm of unspecified part of unspecified bronchus or lung: Secondary | ICD-10-CM | POA: Diagnosis not present

## 2016-10-12 NOTE — Progress Notes (Signed)
[place]  [phone]) [phone] [phone]  ID: Christopher Huber OB: Sep 21, 1946  MR#: [id number]  WUX#:[id number]  Patient Care Team: Venia Carbon, MD as PCP - General (Internal Medicine)  CHIEF COMPLAINT: Recurrent bilateral adenocarcinoma lung cancer.  INTERVAL HISTORY: Patient returns to clinic today for further evaluation and consideration of cycle 3 of nivolumab. He continues to have increased hoarseness of voice, cough, shortness of breath, and stridor. He has increasing weakness and fatigue. He has no neurologic complaints. He denies any fevers. He has a poor appetite, but denies weight loss. He continues to have right shoulder pain. He does not complain of constipation today. He has difficulty sleeping. He denies any nausea, vomiting, constipation, or diarrhea. He has no urinary complaints. Patient offers no further specific complaints today.   REVIEW OF SYSTEMS:   Review of Systems  Constitutional: Positive for malaise/fatigue and weight loss. Negative for fever.  Respiratory: Positive for cough and stridor. Negative for hemoptysis, sputum production and shortness of breath.   Cardiovascular: Negative.  Negative for chest pain.  Gastrointestinal: Negative for constipation, nausea and vomiting.  Musculoskeletal: Positive for back pain and joint pain.  Skin: Negative for rash.  Neurological: Positive for tingling and weakness.  Psychiatric/Behavioral: The patient is nervous/anxious.    As per HPI. Otherwise, a complete review of systems is negative.   PAST MEDICAL HISTORY: Past Medical History:  Diagnosis Date  . Arthritis    right arm  . GERD (gastroesophageal reflux disease)   . Lung cancer ([place]) 2013   Rad tx's and chemo.     PAST SURGICAL HISTORY: Past Surgical History:  Procedure Laterality Date  . COLONOSCOPY    . ESOPHAGOGASTRODUODENOSCOPY    . FLEXIBLE BRONCHOSCOPY N/A 09/03/2016   Procedure: FLEXIBLE BRONCHOSCOPY;  Surgeon:  Wilhelmina Mcardle, MD;  Location: [facility] ORS;  Service: Pulmonary;  Laterality: N/A;  . Left shoulder reconstruction  ~1965   after hunting accident    FAMILY HISTORY Family History  Problem Relation Age of Onset  . Heart disease Father   . Diabetes Neg Hx   . Cancer Neg Hx        ADVANCED DIRECTIVES:    HEALTH MAINTENANCE: Social History  Substance Use Topics  . Smoking status: Former Smoker    Packs/day: 1.00    Years: 15.00    Quit date: 06/27/1997  . Smokeless tobacco: Never Used  . Alcohol use No     Colonoscopy:  PAP:  Bone density:  Lipid panel:  Allergies  Allergen Reactions  . Pemetrexed Rash  . Codeine Nausea And Vomiting    Dizzy     Current Outpatient Prescriptions  Medication Sig Dispense Refill  . albuterol (PROVENTIL HFA;VENTOLIN HFA) 108 (90 Base) MCG/ACT inhaler Inhale 2 puffs into the lungs every 6 (six) hours as needed for wheezing or shortness of breath. 1 Inhaler 3  . Ascorbic Acid (VITAMIN C) 1000 MG tablet Take 1,000 mg by mouth daily.    . Cholecalciferol (VITAMIN D) 2000 UNITS CAPS Take 1 capsule by mouth daily.    . fluticasone-salmeterol (ADVAIR HFA) 115-21 MCG/ACT inhaler Inhale 2 puffs into the lungs 2 (two) times daily. 1 Inhaler 12  . GENERLAC 10 GM/15ML SOLN Reported on 03/05/2016  1  . HYDROcodone-homatropine (HYCODAN) 5-1.5 MG/5ML syrup Take 5 mLs by mouth every 4 (four) hours as needed for cough. 120 mL 0  . magnesium oxide (MAG-OX) 400 MG tablet Take 400 mg by mouth daily.    . megestrol (  MEGACE) 40 MG tablet Take 1 tablet (40 mg total) by mouth daily. 30 tablet 2  . Multiple Vitamin (MULTIVITAMIN WITH MINERALS) TABS Take 1 tablet by mouth daily.    Marland Kitchen omeprazole (PRILOSEC) 40 MG capsule Take 40 mg by mouth daily.     Marland Kitchen tiotropium (SPIRIVA) 18 MCG inhalation capsule Place 1 capsule (18 mcg total) into inhaler and inhale daily. 30 capsule 12  . traMADol (ULTRAM) 50 MG tablet Take 50 mg by mouth every 6 (six) hours as needed.    .  vitamin E 400 UNIT capsule Take 400 Units by mouth daily.     No current facility-administered medications for this visit.    Facility-Administered Medications Ordered in Other Visits  Medication Dose Route Frequency Provider Last Rate Last Dose  . sodium chloride flush (NS) 0.9 % injection 10 mL  10 mL Intravenous PRN Lloyd Huger, MD   10 mL at 05/26/16 1056    OBJECTIVE: Vitals:   10/14/16 1046  BP: 125/90  Pulse: (!) 112  Resp: 18  Temp: 98.2 F (36.8 C)     Body mass index is 24.08 kg/m.    ECOG FS:2 - Symptomatic, <50% confined to bed  General: Well-developed, well-nourished, no acute distress. Eyes: anicteric sclera. Lungs: Stridor, diminished right breath sounds. Heart: Regular rate and rhythm. No rubs, murmurs, or gallops. Abdomen: Soft, nontender, nondistended. No organomegaly noted, normoactive bowel sounds. Musculoskeletal: No edema, cyanosis, or clubbing. Neuro: Alert, answering all questions appropriately. Cranial nerves grossly intact. Skin: Rash has resolved. Psych: Normal affect.  LAB RESULTS:  Lab Results  Component Value Date   NA 137 10/14/2016   K 3.6 10/14/2016   CL 102 10/14/2016   CO2 27 10/14/2016   GLUCOSE 102 (H) 10/14/2016   BUN 13 10/14/2016   CREATININE 0.94 10/14/2016   CALCIUM 9.1 10/14/2016   PROT 6.8 10/14/2016   ALBUMIN 3.2 (L) 10/14/2016   AST 14 (L) 10/14/2016   ALT 9 (L) 10/14/2016   ALKPHOS 66 10/14/2016   BILITOT 0.7 10/14/2016   GFRNONAA >60 10/14/2016   GFRAA >60 10/14/2016    Lab Results  Component Value Date   WBC 8.8 10/14/2016   NEUTROABS 7.1 (H) 10/14/2016   HGB 12.4 (L) 10/14/2016   HCT 36.3 (L) 10/14/2016   MCV 77.9 (L) 10/14/2016   PLT 418 10/14/2016     STUDIES: No results found. Oncology history: Patient was initially diagnosed with left upper lobe adenocarcinoma in 2014 and received 8 cycles of concurrent weekly carboplatinum and Taxol along with XRT between February 17, 2013 and Apr 07, 2013. He  did not receive consolidation treatment at that time.  Patient was then placed on maintenance Tarceva. He then had a recurrence in his right upper lobe and subsequently underwent 4 cycles of cisplatin and pemetrexed between November 14, 2015 and January 16, 2016. Pemetrexed was subsequently discontinued secondary significant rash.  Patient initiated nivolumab on September 16, 2016.   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 T736mmutation was not detected.  PLAN:    1.  Recurrent bilateral adenocarcinoma lung cancer:  Case discussed with pulmonology and pathology confirming recurrence of patient's adenocarcinoma. CT scan results from August 27, 2016 reviewed independently and reported as above.  After lengthy discussion with patient, he wishes to continue to pursue aggressive treatment. Proceed with cycle 3 of 6 of nivolumab today. XRT is no longer an option at this point. PDL-1 testing is positive at  30%. Return to clinic on 2 weeks for consideration of cycle 4. Plan to give treatment every 2 weeks and then reimage at the conclusion of cycle 6.  2.  Rash:  Resolved.  Secondary to pemetrexed which has been discontinued.  3.  Hypomagnesemia: Resolved. 4.  Cough: Likely secondary to recurrence. 5.  Nausea: Patient does not complain of this today.  Continue Zofran and Compazine as needed. 6.  Constipation: Continue stool softeners and Miralax.  Lactulose as needed.  7.  Pain: Tramadol did not help and patient cannot take narcotics given side effects. Monitor. 8.  Poor appetite: Continue Megace and patient was also given a dietary consult.  Patient expressed understanding and was in agreement with this plan. He also understands that He can call clinic at any time with any questions, concerns, or complaints.    Lloyd Huger, MD   10/14/2016 11:23 AM

## 2016-10-12 NOTE — Progress Notes (Signed)
PROBLEMS: Recurrent lung cancer COPD Disabling dyspnea  SUBJ: Remains very limited by DOE. Now on O2 wearing it > 20 hrs per day. Has persistent cough without significant sputum production. Denies hemoptysis. Is having trouble eating as nothing tastes good to him. Losing weight. Still in good spirits, though. Denies CP, fever, purulent sputum, hemoptysis, LE edema and calf tenderness Under care of Dr Grayland Ormond and undergoing immunotherapy  OBJ: Vitals:   10/10/16 1003  BP: 118/90  Pulse: (!) 127  SpO2: 94%   2 LPM [place]   Gen: NAD @ rest, dyspneic with minimal exertion HEENT: NSC Neck: NO LAN, no JVD noted Lungs: stridor, scattered wheezes Cardiovascular: Reg rate, normal rhythm, no M noted Abdomen: Soft, NT +BS Ext: no C/C/E Neuro: CNs intact, motor/sens grossly intact Skin: No lesions noted    DATA:   IMPRESSION: 1) Recurrent non-small cell lung cancer 2) Severe exertional dyspnea 3) Stridor/wheeze due to 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

## 2016-10-14 ENCOUNTER — Inpatient Hospital Stay: Payer: Medicare Other

## 2016-10-14 ENCOUNTER — Inpatient Hospital Stay: Payer: Medicare Other | Attending: Oncology | Admitting: Oncology

## 2016-10-14 VITALS — BP 125/90 | HR 112 | Temp 98.2°F | Resp 18 | Wt 163.0 lb

## 2016-10-14 DIAGNOSIS — C3412 Malignant neoplasm of upper lobe, left bronchus or lung: Secondary | ICD-10-CM | POA: Diagnosis not present

## 2016-10-14 DIAGNOSIS — Z79899 Other long term (current) drug therapy: Secondary | ICD-10-CM | POA: Insufficient documentation

## 2016-10-14 DIAGNOSIS — F419 Anxiety disorder, unspecified: Secondary | ICD-10-CM | POA: Diagnosis not present

## 2016-10-14 DIAGNOSIS — Z5112 Encounter for antineoplastic immunotherapy: Secondary | ICD-10-CM | POA: Insufficient documentation

## 2016-10-14 DIAGNOSIS — Z9221 Personal history of antineoplastic chemotherapy: Secondary | ICD-10-CM | POA: Insufficient documentation

## 2016-10-14 DIAGNOSIS — Z85118 Personal history of other malignant neoplasm of bronchus and lung: Secondary | ICD-10-CM | POA: Insufficient documentation

## 2016-10-14 DIAGNOSIS — R0602 Shortness of breath: Secondary | ICD-10-CM | POA: Diagnosis not present

## 2016-10-14 DIAGNOSIS — R5381 Other malaise: Secondary | ICD-10-CM | POA: Insufficient documentation

## 2016-10-14 DIAGNOSIS — K5900 Constipation, unspecified: Secondary | ICD-10-CM | POA: Diagnosis not present

## 2016-10-14 DIAGNOSIS — M549 Dorsalgia, unspecified: Secondary | ICD-10-CM | POA: Diagnosis not present

## 2016-10-14 DIAGNOSIS — K219 Gastro-esophageal reflux disease without esophagitis: Secondary | ICD-10-CM | POA: Diagnosis not present

## 2016-10-14 DIAGNOSIS — R061 Stridor: Secondary | ICD-10-CM | POA: Diagnosis not present

## 2016-10-14 DIAGNOSIS — R05 Cough: Secondary | ICD-10-CM | POA: Diagnosis not present

## 2016-10-14 DIAGNOSIS — R5383 Other fatigue: Secondary | ICD-10-CM | POA: Insufficient documentation

## 2016-10-14 DIAGNOSIS — Z923 Personal history of irradiation: Secondary | ICD-10-CM | POA: Diagnosis not present

## 2016-10-14 DIAGNOSIS — Z888 Allergy status to other drugs, medicaments and biological substances status: Secondary | ICD-10-CM | POA: Diagnosis not present

## 2016-10-14 DIAGNOSIS — M1990 Unspecified osteoarthritis, unspecified site: Secondary | ICD-10-CM

## 2016-10-14 DIAGNOSIS — R490 Dysphonia: Secondary | ICD-10-CM | POA: Diagnosis not present

## 2016-10-14 DIAGNOSIS — Z87891 Personal history of nicotine dependence: Secondary | ICD-10-CM | POA: Diagnosis not present

## 2016-10-14 DIAGNOSIS — M25511 Pain in right shoulder: Secondary | ICD-10-CM | POA: Diagnosis not present

## 2016-10-14 DIAGNOSIS — R531 Weakness: Secondary | ICD-10-CM | POA: Insufficient documentation

## 2016-10-14 DIAGNOSIS — G4700 Insomnia, unspecified: Secondary | ICD-10-CM | POA: Insufficient documentation

## 2016-10-14 DIAGNOSIS — C3490 Malignant neoplasm of unspecified part of unspecified bronchus or lung: Secondary | ICD-10-CM

## 2016-10-14 DIAGNOSIS — R630 Anorexia: Secondary | ICD-10-CM | POA: Diagnosis not present

## 2016-10-14 DIAGNOSIS — C3411 Malignant neoplasm of upper lobe, right bronchus or lung: Secondary | ICD-10-CM | POA: Insufficient documentation

## 2016-10-14 LAB — CBC WITH DIFFERENTIAL/PLATELET
BASOS ABS: 0 10*3/uL (ref 0–0.1)
BASOS PCT: 0 %
Eosinophils Absolute: 0.1 10*3/uL (ref 0–0.7)
Eosinophils Relative: 2 %
HEMATOCRIT: 36.3 % — AB (ref 40.0–52.0)
HEMOGLOBIN: 12.4 g/dL — AB (ref 13.0–18.0)
LYMPHS PCT: 6 %
Lymphs Abs: 0.6 10*3/uL — ABNORMAL LOW (ref 1.0–3.6)
MCH: 26.5 pg (ref 26.0–34.0)
MCHC: 34 g/dL (ref 32.0–36.0)
MCV: 77.9 fL — AB (ref 80.0–100.0)
MONOS PCT: 11 %
Monocytes Absolute: 1 10*3/uL (ref 0.2–1.0)
NEUTROS ABS: 7.1 10*3/uL — AB (ref 1.4–6.5)
NEUTROS PCT: 81 %
Platelets: 418 10*3/uL (ref 150–440)
RBC: 4.67 MIL/uL (ref 4.40–5.90)
RDW: 15.3 % — ABNORMAL HIGH (ref 11.5–14.5)
WBC: 8.8 10*3/uL (ref 3.8–10.6)

## 2016-10-14 LAB — COMPREHENSIVE METABOLIC PANEL
ALBUMIN: 3.2 g/dL — AB (ref 3.5–5.0)
ALK PHOS: 66 U/L (ref 38–126)
ALT: 9 U/L — ABNORMAL LOW (ref 17–63)
AST: 14 U/L — AB (ref 15–41)
Anion gap: 8 (ref 5–15)
BILIRUBIN TOTAL: 0.7 mg/dL (ref 0.3–1.2)
BUN: 13 mg/dL (ref 6–20)
CALCIUM: 9.1 mg/dL (ref 8.9–10.3)
CO2: 27 mmol/L (ref 22–32)
CREATININE: 0.94 mg/dL (ref 0.61–1.24)
Chloride: 102 mmol/L (ref 101–111)
GFR calc Af Amer: >60 mL/min (ref >60)
GLUCOSE: 102 mg/dL — AB (ref 65–99)
Potassium: 3.6 mmol/L (ref 3.5–5.1)
Sodium: 137 mmol/L (ref 135–145)
TOTAL PROTEIN: 6.8 g/dL (ref 6.5–8.1)

## 2016-10-14 MED ORDER — SODIUM CHLORIDE 0.9 % IV SOLN
Freq: Once | INTRAVENOUS | Status: AC
  Administered 2016-10-14: 12:00:00 via INTRAVENOUS
  Filled 2016-10-14: qty 1000

## 2016-10-14 MED ORDER — SODIUM CHLORIDE 0.9% FLUSH
10.0000 mL | INTRAVENOUS | Status: DC | PRN
  Administered 2016-10-14: 10 mL
  Filled 2016-10-14: qty 10

## 2016-10-14 MED ORDER — SODIUM CHLORIDE 0.9 % IV SOLN
240.0000 mg | Freq: Once | INTRAVENOUS | Status: AC
  Administered 2016-10-14: 240 mg via INTRAVENOUS
  Filled 2016-10-14: qty 4

## 2016-10-14 MED ORDER — HEPARIN SOD (PORK) LOCK FLUSH 100 UNIT/ML IV SOLN
500.0000 [IU] | Freq: Once | INTRAVENOUS | Status: AC | PRN
  Administered 2016-10-14: 500 [IU]
  Filled 2016-10-14: qty 5

## 2016-10-14 NOTE — Progress Notes (Signed)
Complains of shortness of breath with exertion. Decrease appetite but megace is helping improve appetite.

## 2016-10-15 ENCOUNTER — Other Ambulatory Visit: Payer: Self-pay | Admitting: *Deleted

## 2016-10-15 DIAGNOSIS — C3490 Malignant neoplasm of unspecified part of unspecified bronchus or lung: Secondary | ICD-10-CM

## 2016-10-20 ENCOUNTER — Inpatient Hospital Stay: Payer: Medicare Other

## 2016-10-26 NOTE — Progress Notes (Signed)
[place]  Telephone:(336) [phone] [phone]) [phone]  ID: Christopher Huber OB: 03-16-1946  MR#: [id number]  YFV#:[id number]  Patient Care Team: Venia Carbon, MD as PCP - General (Internal Medicine)  CHIEF COMPLAINT: Recurrent bilateral adenocarcinoma lung cancer.  INTERVAL HISTORY: Patient returns to clinic today for further evaluation and consideration of cycle 4 of nivolumab. He continues to have increased hoarseness of voice, cough, shortness of breath, and stridor. He continues to have increased weakness and fatigue. He has no neurologic complaints. He denies any fevers. He has a poor appetite, but denies weight loss. He continues to have right shoulder pain. He does not complain of constipation today. He has difficulty sleeping. He denies any nausea, vomiting, constipation, or diarrhea. He has no urinary complaints. Patient offers no further specific complaints today.   REVIEW OF SYSTEMS:   Review of Systems  Constitutional: Positive for malaise/fatigue and weight loss. Negative for fever.  Respiratory: Positive for cough and stridor. Negative for hemoptysis, sputum production and shortness of breath.   Cardiovascular: Negative.  Negative for chest pain.  Gastrointestinal: Negative for constipation, nausea and vomiting.  Musculoskeletal: Positive for back pain and joint pain.  Skin: Negative for rash.  Neurological: Positive for tingling and weakness.  Psychiatric/Behavioral: The patient is nervous/anxious.    As per HPI. Otherwise, a complete review of systems is negative.   PAST MEDICAL HISTORY: Past Medical History:  Diagnosis Date  . Arthritis    right arm  . GERD (gastroesophageal reflux disease)   . Lung cancer ([place]) 2013   Rad tx's and chemo.     PAST SURGICAL HISTORY: Past Surgical History:  Procedure Laterality Date  . COLONOSCOPY    . ESOPHAGOGASTRODUODENOSCOPY    . FLEXIBLE BRONCHOSCOPY N/A 09/03/2016   Procedure: FLEXIBLE  BRONCHOSCOPY;  Surgeon: Wilhelmina Mcardle, MD;  Location: [facility] ORS;  Service: Pulmonary;  Laterality: N/A;  . Left shoulder reconstruction  ~1965   after hunting accident    FAMILY HISTORY Family History  Problem Relation Age of Onset  . Heart disease Father   . Diabetes Neg Hx   . Cancer Neg Hx        ADVANCED DIRECTIVES:    HEALTH MAINTENANCE: Social History  Substance Use Topics  . Smoking status: Former Smoker    Packs/day: 1.00    Years: 15.00    Quit date: 06/27/1997  . Smokeless tobacco: Never Used  . Alcohol use No     Colonoscopy:  PAP:  Bone density:  Lipid panel:  Allergies  Allergen Reactions  . Pemetrexed Rash  . Codeine Nausea And Vomiting    Dizzy     Current Outpatient Prescriptions  Medication Sig Dispense Refill  . albuterol (PROVENTIL HFA;VENTOLIN HFA) 108 (90 Base) MCG/ACT inhaler Inhale 2 puffs into the lungs every 6 (six) hours as needed for wheezing or shortness of breath. 1 Inhaler 3  . Ascorbic Acid (VITAMIN C) 1000 MG tablet Take 1,000 mg by mouth daily.    . Cholecalciferol (VITAMIN D) 2000 UNITS CAPS Take 1 capsule by mouth daily.    Marland Kitchen GENERLAC 10 GM/15ML SOLN Reported on 03/05/2016  1  . HYDROcodone-homatropine (HYCODAN) 5-1.5 MG/5ML syrup Take 5 mLs by mouth every 4 (four) hours as needed for cough. 120 mL 0  . magnesium oxide (MAG-OX) 400 MG tablet Take 400 mg by mouth daily.    . megestrol (MEGACE) 40 MG tablet Take 1 tablet (40 mg total) by mouth daily. 30 tablet 2  . Multiple  Vitamin (MULTIVITAMIN WITH MINERALS) TABS Take 1 tablet by mouth daily.    Marland Kitchen omeprazole (PRILOSEC) 40 MG capsule Take 40 mg by mouth daily.     Marland Kitchen tiotropium (SPIRIVA) 18 MCG inhalation capsule Place 1 capsule (18 mcg total) into inhaler and inhale daily. 30 capsule 12  . traMADol (ULTRAM) 50 MG tablet Take 50 mg by mouth every 6 (six) hours as needed.    . vitamin E 400 UNIT capsule Take 400 Units by mouth daily.    . prochlorperazine (COMPAZINE) 10 MG tablet  Take 1 tablet (10 mg total) by mouth every 6 (six) hours as needed for nausea or vomiting. 30 tablet 1   No current facility-administered medications for this visit.    Facility-Administered Medications Ordered in Other Visits  Medication Dose Route Frequency Provider Last Rate Last Dose  . sodium chloride flush (NS) 0.9 % injection 10 mL  10 mL Intravenous PRN Lloyd Huger, MD   10 mL at 05/26/16 1056  . sodium chloride flush (NS) 0.9 % injection 10 mL  10 mL Intracatheter PRN Lloyd Huger, MD        OBJECTIVE: Vitals:   10/28/16 0954  BP: 98/66  Pulse: (!) 120  Resp: (!) 24  Temp: 97.2 F (36.2 C)     Body mass index is 23.51 kg/m.    ECOG FS:2 - Symptomatic, <50% confined to bed  General: Well-developed, well-nourished, no acute distress. Eyes: anicteric sclera. Lungs: Stridor, diminished right breath sounds. Heart: Regular rate and rhythm. No rubs, murmurs, or gallops. Abdomen: Soft, nontender, nondistended. No organomegaly noted, normoactive bowel sounds. Musculoskeletal: No edema, cyanosis, or clubbing. Neuro: Alert, answering all questions appropriately. Cranial nerves grossly intact. Skin: Rash has resolved. Psych: Normal affect.  LAB RESULTS:  Lab Results  Component Value Date   NA 137 10/28/2016   K 3.6 10/28/2016   CL 101 10/28/2016   CO2 26 10/28/2016   GLUCOSE 104 (H) 10/28/2016   BUN 12 10/28/2016   CREATININE 0.93 10/28/2016   CALCIUM 9.2 10/28/2016   PROT 6.7 10/28/2016   ALBUMIN 3.4 (L) 10/28/2016   AST 18 10/28/2016   ALT 11 (L) 10/28/2016   ALKPHOS 74 10/28/2016   BILITOT 0.5 10/28/2016   GFRNONAA >60 10/28/2016   GFRAA >60 10/28/2016    Lab Results  Component Value Date   WBC 8.9 10/28/2016   NEUTROABS 6.5 10/28/2016   HGB 12.6 (L) 10/28/2016   HCT 37.7 (L) 10/28/2016   MCV 78.2 (L) 10/28/2016   PLT 480 (H) 10/28/2016     STUDIES: No results found. Oncology history: Patient was initially diagnosed with left upper lobe  adenocarcinoma in 2014 and received 8 cycles of concurrent weekly carboplatinum and Taxol along with XRT between February 17, 2013 and Apr 07, 2013. He did not receive consolidation treatment at that time.  Patient was then placed on maintenance Tarceva. He then had a recurrence in his right upper lobe and subsequently underwent 4 cycles of cisplatin and pemetrexed between November 14, 2015 and January 16, 2016. Pemetrexed was subsequently discontinued secondary significant rash.  Patient initiated nivolumab on September 16, 2016.   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 T769mmutation was not detected.  PLAN:    1.  Recurrent bilateral adenocarcinoma lung cancer:  Case discussed with pulmonology and pathology confirming recurrence of patient's adenocarcinoma. CT scan results from August 27, 2016 reviewed independently and reported as above.  After lengthy discussion  with patient, he wishes to continue to pursue aggressive treatment. Proceed with cycle 4 of 6 of nivolumab today. XRT is no longer an option at this point. PDL-1 testing is positive at 30%. Return to clinic on 2 weeks for consideration of cycle 5. Plan to give treatment every 2 weeks and then reimage at the conclusion of cycle 6.  2.  Rash:  Resolved.  Secondary to pemetrexed which has been discontinued.  3.  Hypomagnesemia: Resolved. 4.  Cough/shortness of breath: Likely secondary to recurrence. Continue inhalers and nebulizers per pulmonary. 5.  Nausea: Patient does not complain of this today.  Continue Zofran and Compazine as needed. 6.  Constipation: Continue stool softeners and Miralax.  Lactulose as needed.  7.  Pain: Tramadol did not help and patient cannot take narcotics given side effects. Monitor. 8.  Poor appetite: Patient has been instructed to take his Megace procedure skin regularly on a daily basis. Appreciate dietary input.   Patient expressed  understanding and was in agreement with this plan. He also understands that He can call clinic at any time with any questions, concerns, or complaints.    Lloyd Huger, MD   10/28/2016 1:25 PM

## 2016-10-28 ENCOUNTER — Inpatient Hospital Stay (HOSPITAL_BASED_OUTPATIENT_CLINIC_OR_DEPARTMENT_OTHER): Payer: Medicare Other | Admitting: Oncology

## 2016-10-28 ENCOUNTER — Inpatient Hospital Stay: Payer: Medicare Other

## 2016-10-28 VITALS — BP 105/75 | HR 111 | Resp 22

## 2016-10-28 VITALS — BP 98/66 | HR 120 | Temp 97.2°F | Resp 24 | Wt 159.2 lb

## 2016-10-28 DIAGNOSIS — Z87891 Personal history of nicotine dependence: Secondary | ICD-10-CM

## 2016-10-28 DIAGNOSIS — M1990 Unspecified osteoarthritis, unspecified site: Secondary | ICD-10-CM

## 2016-10-28 DIAGNOSIS — R531 Weakness: Secondary | ICD-10-CM | POA: Diagnosis not present

## 2016-10-28 DIAGNOSIS — R05 Cough: Secondary | ICD-10-CM

## 2016-10-28 DIAGNOSIS — C3412 Malignant neoplasm of upper lobe, left bronchus or lung: Secondary | ICD-10-CM | POA: Diagnosis not present

## 2016-10-28 DIAGNOSIS — R5383 Other fatigue: Secondary | ICD-10-CM

## 2016-10-28 DIAGNOSIS — R630 Anorexia: Secondary | ICD-10-CM

## 2016-10-28 DIAGNOSIS — R0602 Shortness of breath: Secondary | ICD-10-CM

## 2016-10-28 DIAGNOSIS — C3411 Malignant neoplasm of upper lobe, right bronchus or lung: Secondary | ICD-10-CM | POA: Diagnosis not present

## 2016-10-28 DIAGNOSIS — Z5112 Encounter for antineoplastic immunotherapy: Secondary | ICD-10-CM | POA: Diagnosis not present

## 2016-10-28 DIAGNOSIS — Z888 Allergy status to other drugs, medicaments and biological substances status: Secondary | ICD-10-CM | POA: Diagnosis not present

## 2016-10-28 DIAGNOSIS — Z9221 Personal history of antineoplastic chemotherapy: Secondary | ICD-10-CM | POA: Diagnosis not present

## 2016-10-28 DIAGNOSIS — R490 Dysphonia: Secondary | ICD-10-CM

## 2016-10-28 DIAGNOSIS — F419 Anxiety disorder, unspecified: Secondary | ICD-10-CM

## 2016-10-28 DIAGNOSIS — C3490 Malignant neoplasm of unspecified part of unspecified bronchus or lung: Secondary | ICD-10-CM

## 2016-10-28 DIAGNOSIS — G4700 Insomnia, unspecified: Secondary | ICD-10-CM

## 2016-10-28 DIAGNOSIS — K219 Gastro-esophageal reflux disease without esophagitis: Secondary | ICD-10-CM

## 2016-10-28 DIAGNOSIS — Z79899 Other long term (current) drug therapy: Secondary | ICD-10-CM

## 2016-10-28 DIAGNOSIS — Z923 Personal history of irradiation: Secondary | ICD-10-CM | POA: Diagnosis not present

## 2016-10-28 DIAGNOSIS — M25511 Pain in right shoulder: Secondary | ICD-10-CM | POA: Diagnosis not present

## 2016-10-28 DIAGNOSIS — Z85118 Personal history of other malignant neoplasm of bronchus and lung: Secondary | ICD-10-CM | POA: Diagnosis not present

## 2016-10-28 DIAGNOSIS — R061 Stridor: Secondary | ICD-10-CM | POA: Diagnosis not present

## 2016-10-28 DIAGNOSIS — M549 Dorsalgia, unspecified: Secondary | ICD-10-CM

## 2016-10-28 DIAGNOSIS — K5900 Constipation, unspecified: Secondary | ICD-10-CM

## 2016-10-28 DIAGNOSIS — R5381 Other malaise: Secondary | ICD-10-CM | POA: Diagnosis not present

## 2016-10-28 LAB — CBC WITH DIFFERENTIAL/PLATELET
Basophils Absolute: 0.1 10*3/uL (ref 0–0.1)
Basophils Relative: 1 %
EOS ABS: 0.8 10*3/uL — AB (ref 0–0.7)
EOS PCT: 9 %
HCT: 37.7 % — ABNORMAL LOW (ref 40.0–52.0)
Hemoglobin: 12.6 g/dL — ABNORMAL LOW (ref 13.0–18.0)
LYMPHS ABS: 0.6 10*3/uL — AB (ref 1.0–3.6)
Lymphocytes Relative: 7 %
MCH: 26.2 pg (ref 26.0–34.0)
MCHC: 33.5 g/dL (ref 32.0–36.0)
MCV: 78.2 fL — ABNORMAL LOW (ref 80.0–100.0)
MONOS PCT: 9 %
Monocytes Absolute: 0.8 10*3/uL (ref 0.2–1.0)
Neutro Abs: 6.5 10*3/uL (ref 1.4–6.5)
Neutrophils Relative %: 74 %
PLATELETS: 480 10*3/uL — AB (ref 150–440)
RBC: 4.82 MIL/uL (ref 4.40–5.90)
RDW: 15.4 % — ABNORMAL HIGH (ref 11.5–14.5)
WBC: 8.9 10*3/uL (ref 3.8–10.6)

## 2016-10-28 LAB — COMPREHENSIVE METABOLIC PANEL
ALT: 11 U/L — ABNORMAL LOW (ref 17–63)
ANION GAP: 10 (ref 5–15)
AST: 18 U/L (ref 15–41)
Albumin: 3.4 g/dL — ABNORMAL LOW (ref 3.5–5.0)
Alkaline Phosphatase: 74 U/L (ref 38–126)
BUN: 12 mg/dL (ref 6–20)
CHLORIDE: 101 mmol/L (ref 101–111)
CO2: 26 mmol/L (ref 22–32)
Calcium: 9.2 mg/dL (ref 8.9–10.3)
Creatinine, Ser: 0.93 mg/dL (ref 0.61–1.24)
GFR calc non Af Amer: >60 mL/min (ref >60)
Glucose, Bld: 104 mg/dL — ABNORMAL HIGH (ref 65–99)
POTASSIUM: 3.6 mmol/L (ref 3.5–5.1)
SODIUM: 137 mmol/L (ref 135–145)
Total Bilirubin: 0.5 mg/dL (ref 0.3–1.2)
Total Protein: 6.7 g/dL (ref 6.5–8.1)

## 2016-10-28 LAB — TSH: TSH: 9.461 u[IU]/mL — AB (ref 0.350–4.500)

## 2016-10-28 MED ORDER — HEPARIN SOD (PORK) LOCK FLUSH 100 UNIT/ML IV SOLN
500.0000 [IU] | Freq: Once | INTRAVENOUS | Status: AC | PRN
  Administered 2016-10-28: 500 [IU]
  Filled 2016-10-28: qty 5

## 2016-10-28 MED ORDER — SODIUM CHLORIDE 0.9 % IV SOLN
240.0000 mg | Freq: Once | INTRAVENOUS | Status: AC
  Administered 2016-10-28: 240 mg via INTRAVENOUS
  Filled 2016-10-28: qty 20

## 2016-10-28 MED ORDER — PROCHLORPERAZINE MALEATE 10 MG PO TABS: 10.0000 mg | ORAL_TABLET | Freq: Four times a day (QID) | ORAL | 1 refills | Status: AC | PRN

## 2016-10-28 MED ORDER — SODIUM CHLORIDE 0.9 % IV SOLN
Freq: Once | INTRAVENOUS | Status: AC
  Administered 2016-10-28: 11:00:00 via INTRAVENOUS
  Filled 2016-10-28: qty 1000

## 2016-10-28 MED ORDER — HYDROCODONE-HOMATROPINE 5-1.5 MG/5ML PO SYRP: 5.0000 mL | ORAL_SOLUTION | ORAL | 0 refills | Status: AC | PRN

## 2016-10-28 MED ORDER — SODIUM CHLORIDE 0.9% FLUSH
10.0000 mL | INTRAVENOUS | Status: DC | PRN
  Filled 2016-10-28: qty 10

## 2016-10-28 NOTE — Progress Notes (Signed)
Patient has a decrease in appetite but he has not been taking the Megace regularly so he is going to try to take as prescribed.  Reports that he does have some itching without visual rash since starting treatment.  Since last visit he has noticed some ankle swelling during the night with leg muscle aching.  Blood pressure is low today at 98/66.  Is requesting a printed rx for Hycodan cough syrup since it will be 2 weeks before next office visit.

## 2016-10-29 LAB — T4: T4, Total: 8.1 ug/dL (ref 4.5–12.0)

## 2016-11-02 ENCOUNTER — Inpatient Hospital Stay
Admission: EM | Admit: 2016-11-02 | Discharge: 2016-12-08 | DRG: 207 | Disposition: E | Payer: Medicare Other | Attending: Internal Medicine | Admitting: Internal Medicine

## 2016-11-02 ENCOUNTER — Other Ambulatory Visit: Payer: Self-pay

## 2016-11-02 ENCOUNTER — Encounter: Payer: Self-pay | Admitting: Emergency Medicine

## 2016-11-02 ENCOUNTER — Emergency Department: Payer: Medicare Other

## 2016-11-02 DIAGNOSIS — J90 Pleural effusion, not elsewhere classified: Secondary | ICD-10-CM | POA: Diagnosis not present

## 2016-11-02 DIAGNOSIS — C3490 Malignant neoplasm of unspecified part of unspecified bronchus or lung: Secondary | ICD-10-CM

## 2016-11-02 DIAGNOSIS — J188 Other pneumonia, unspecified organism: Secondary | ICD-10-CM | POA: Diagnosis not present

## 2016-11-02 DIAGNOSIS — D72829 Elevated white blood cell count, unspecified: Secondary | ICD-10-CM | POA: Diagnosis present

## 2016-11-02 DIAGNOSIS — J9621 Acute and chronic respiratory failure with hypoxia: Principal | ICD-10-CM | POA: Diagnosis present

## 2016-11-02 DIAGNOSIS — Z0189 Encounter for other specified special examinations: Secondary | ICD-10-CM

## 2016-11-02 DIAGNOSIS — R0602 Shortness of breath: Secondary | ICD-10-CM | POA: Diagnosis not present

## 2016-11-02 DIAGNOSIS — Z4682 Encounter for fitting and adjustment of non-vascular catheter: Secondary | ICD-10-CM | POA: Diagnosis not present

## 2016-11-02 DIAGNOSIS — C3412 Malignant neoplasm of upper lobe, left bronchus or lung: Secondary | ICD-10-CM | POA: Diagnosis present

## 2016-11-02 DIAGNOSIS — I4892 Unspecified atrial flutter: Secondary | ICD-10-CM | POA: Diagnosis not present

## 2016-11-02 DIAGNOSIS — Z6833 Body mass index (BMI) 33.0-33.9, adult: Secondary | ICD-10-CM

## 2016-11-02 DIAGNOSIS — C3411 Malignant neoplasm of upper lobe, right bronchus or lung: Secondary | ICD-10-CM | POA: Diagnosis present

## 2016-11-02 DIAGNOSIS — E43 Unspecified severe protein-calorie malnutrition: Secondary | ICD-10-CM | POA: Diagnosis not present

## 2016-11-02 DIAGNOSIS — Z01818 Encounter for other preprocedural examination: Secondary | ICD-10-CM

## 2016-11-02 DIAGNOSIS — R918 Other nonspecific abnormal finding of lung field: Secondary | ICD-10-CM | POA: Diagnosis not present

## 2016-11-02 DIAGNOSIS — Z79818 Long term (current) use of other agents affecting estrogen receptors and estrogen levels: Secondary | ICD-10-CM

## 2016-11-02 DIAGNOSIS — C3491 Malignant neoplasm of unspecified part of right bronchus or lung: Secondary | ICD-10-CM | POA: Diagnosis not present

## 2016-11-02 DIAGNOSIS — J9690 Respiratory failure, unspecified, unspecified whether with hypoxia or hypercapnia: Secondary | ICD-10-CM | POA: Diagnosis not present

## 2016-11-02 DIAGNOSIS — Z515 Encounter for palliative care: Secondary | ICD-10-CM | POA: Diagnosis not present

## 2016-11-02 DIAGNOSIS — Z923 Personal history of irradiation: Secondary | ICD-10-CM

## 2016-11-02 DIAGNOSIS — Z888 Allergy status to other drugs, medicaments and biological substances status: Secondary | ICD-10-CM | POA: Diagnosis not present

## 2016-11-02 DIAGNOSIS — R061 Stridor: Secondary | ICD-10-CM | POA: Diagnosis not present

## 2016-11-02 DIAGNOSIS — R0603 Acute respiratory distress: Secondary | ICD-10-CM | POA: Diagnosis not present

## 2016-11-02 DIAGNOSIS — K219 Gastro-esophageal reflux disease without esophagitis: Secondary | ICD-10-CM | POA: Diagnosis present

## 2016-11-02 DIAGNOSIS — E871 Hypo-osmolality and hyponatremia: Secondary | ICD-10-CM | POA: Diagnosis present

## 2016-11-02 DIAGNOSIS — Z66 Do not resuscitate: Secondary | ICD-10-CM | POA: Diagnosis not present

## 2016-11-02 DIAGNOSIS — Z87891 Personal history of nicotine dependence: Secondary | ICD-10-CM

## 2016-11-02 DIAGNOSIS — J9811 Atelectasis: Secondary | ICD-10-CM | POA: Diagnosis present

## 2016-11-02 DIAGNOSIS — Z9221 Personal history of antineoplastic chemotherapy: Secondary | ICD-10-CM | POA: Diagnosis not present

## 2016-11-02 DIAGNOSIS — Z79899 Other long term (current) drug therapy: Secondary | ICD-10-CM | POA: Diagnosis not present

## 2016-11-02 DIAGNOSIS — Z885 Allergy status to narcotic agent status: Secondary | ICD-10-CM | POA: Diagnosis not present

## 2016-11-02 DIAGNOSIS — E46 Unspecified protein-calorie malnutrition: Secondary | ICD-10-CM | POA: Diagnosis not present

## 2016-11-02 DIAGNOSIS — Z85118 Personal history of other malignant neoplasm of bronchus and lung: Secondary | ICD-10-CM | POA: Diagnosis not present

## 2016-11-02 DIAGNOSIS — Z9889 Other specified postprocedural states: Secondary | ICD-10-CM

## 2016-11-02 DIAGNOSIS — R0600 Dyspnea, unspecified: Secondary | ICD-10-CM

## 2016-11-02 DIAGNOSIS — C3401 Malignant neoplasm of right main bronchus: Secondary | ICD-10-CM | POA: Diagnosis not present

## 2016-11-02 DIAGNOSIS — J9601 Acute respiratory failure with hypoxia: Secondary | ICD-10-CM | POA: Diagnosis not present

## 2016-11-02 DIAGNOSIS — Z4659 Encounter for fitting and adjustment of other gastrointestinal appliance and device: Secondary | ICD-10-CM

## 2016-11-02 LAB — BASIC METABOLIC PANEL
ANION GAP: 12 (ref 5–15)
BUN: 14 mg/dL (ref 6–20)
CALCIUM: 9.3 mg/dL (ref 8.9–10.3)
CO2: 21 mmol/L — AB (ref 22–32)
Chloride: 103 mmol/L (ref 101–111)
Creatinine, Ser: 1.15 mg/dL (ref 0.61–1.24)
GFR calc Af Amer: >60 mL/min (ref >60)
GLUCOSE: 180 mg/dL — AB (ref 65–99)
POTASSIUM: 3.9 mmol/L (ref 3.5–5.1)
Sodium: 136 mmol/L (ref 135–145)

## 2016-11-02 LAB — CBC WITH DIFFERENTIAL/PLATELET
BASOS ABS: 0.1 10*3/uL (ref 0–0.1)
Basophils Relative: 1 %
EOS PCT: 9 %
Eosinophils Absolute: 1.3 10*3/uL — ABNORMAL HIGH (ref 0–0.7)
HEMATOCRIT: 37.5 % — AB (ref 40.0–52.0)
Hemoglobin: 12.4 g/dL — ABNORMAL LOW (ref 13.0–18.0)
LYMPHS ABS: 2.7 10*3/uL (ref 1.0–3.6)
LYMPHS PCT: 18 %
MCH: 26.6 pg (ref 26.0–34.0)
MCHC: 33.2 g/dL (ref 32.0–36.0)
MCV: 80.1 fL (ref 80.0–100.0)
MONO ABS: 1.5 10*3/uL — AB (ref 0.2–1.0)
Monocytes Relative: 10 %
NEUTROS ABS: 9.1 10*3/uL — AB (ref 1.4–6.5)
Neutrophils Relative %: 62 %
PLATELETS: 575 10*3/uL — AB (ref 150–440)
RBC: 4.68 MIL/uL (ref 4.40–5.90)
RDW: 16.2 % — AB (ref 11.5–14.5)
WBC: 14.8 10*3/uL — ABNORMAL HIGH (ref 3.8–10.6)

## 2016-11-02 LAB — TROPONIN I: Troponin I: <0.03 ng/mL (ref <0.03)

## 2016-11-02 MED ORDER — LORAZEPAM 2 MG/ML IJ SOLN
1.0000 mg | Freq: Once | INTRAMUSCULAR | Status: AC
  Administered 2016-11-02: 1 mg via INTRAVENOUS

## 2016-11-02 MED ORDER — IOPAMIDOL (ISOVUE-370) INJECTION 76%
75.0000 mL | Freq: Once | INTRAVENOUS | Status: AC | PRN
  Administered 2016-11-02: 75 mL via INTRAVENOUS

## 2016-11-02 MED ORDER — IPRATROPIUM-ALBUTEROL 0.5-2.5 (3) MG/3ML IN SOLN
3.0000 mL | Freq: Once | RESPIRATORY_TRACT | Status: AC
Start: 2016-11-02 — End: 2016-11-02
  Administered 2016-11-02: 3 mL via RESPIRATORY_TRACT

## 2016-11-02 MED ORDER — IPRATROPIUM-ALBUTEROL 0.5-2.5 (3) MG/3ML IN SOLN
3.0000 mL | Freq: Once | RESPIRATORY_TRACT | Status: AC
  Administered 2016-11-02: 3 mL via RESPIRATORY_TRACT

## 2016-11-02 MED ORDER — LORAZEPAM 2 MG/ML IJ SOLN
INTRAMUSCULAR | Status: AC
  Filled 2016-11-02: qty 1

## 2016-11-02 NOTE — ED Notes (Signed)
Wife states pt. Right lung CA completely occluding R lung, L lung bronchial flattening.

## 2016-11-02 NOTE — ED Triage Notes (Signed)
Per [facility]: Left Lung bronchial flattening CA, 88% respiratory distress on home 3LO2, current CA tx. 2 nebs in route, solumedrol, nitro patch

## 2016-11-02 NOTE — ED Provider Notes (Signed)
[facility] Emergency Department Provider Note   ____________________________________________   I have reviewed the triage vital signs and the nursing notes.   HISTORY  Chief Complaint Respiratory Distress   History limited by: respiratory distress, some history obtained from wife   HPI Christopher Huber is a 70 y.o. male with history of lung cancer who presents to the emergency department today via EMS because of concerns for shortness of breath. The wife states patient was complaining of feeling not quite right throughout the day however this evening became acutely worse. The patientis on 3 L at home. They were checking his oxygen saturation at home and noticed it was in the 80s this evening. Patient denies any significant chest pain. During transport EMS gave 2 DuoNeb treatments. They attempted CPAP however patient did not tolerate.        Past Medical History:  Diagnosis Date  . Arthritis    right arm  . GERD (gastroesophageal reflux disease)   . Lung cancer ([place]) 2013   Rad tx's and chemo.         Patient Active Problem List   Diagnosis Date Noted  . Acute respiratory failure ([place]) 09/03/2016  . Opacity of lung on imaging study   . SOB (shortness of breath) 07/07/2016  . Inspiratory stridor 07/07/2016  . Wheezing 06/24/2016  . Right-sided thoracic back pain 01/25/2016  . H/O malignant neoplasm 11/07/2015  . Chronic cough 09/11/2015  . GERD (gastroesophageal reflux disease)   . Recurrent non-small cell lung cancer ([place]) 06/18/2015         Past Surgical History:  Procedure Laterality Date  . COLONOSCOPY    . ESOPHAGOGASTRODUODENOSCOPY    . FLEXIBLE BRONCHOSCOPY N/A 09/03/2016   Procedure: FLEXIBLE BRONCHOSCOPY;  Surgeon: Wilhelmina Mcardle, MD;  Location: [facility] ORS;  Service: Pulmonary;  Laterality: N/A;  . Left shoulder reconstruction  ~1965   after hunting accident           Prior to Admission medications    Medication Sig Start Date End Date Taking? Authorizing Provider  albuterol (PROVENTIL HFA;VENTOLIN HFA) 108 (90 Base) MCG/ACT inhaler Inhale 2 puffs into the lungs every 6 (six) hours as needed for wheezing or shortness of breath. 06/11/16  Yes Venia Carbon, MD  Ascorbic Acid (VITAMIN C) 1000 MG tablet Take 1,000 mg by mouth daily.   Yes Historical Provider, MD  Cholecalciferol (VITAMIN D) 2000 UNITS CAPS Take 1 capsule by mouth daily.   Yes Historical Provider, MD  HYDROcodone-homatropine (HYCODAN) 5-1.5 MG/5ML syrup Take 5 mLs by mouth every 4 (four) hours as needed for cough. 10/28/16  Yes Lloyd Huger, MD  magnesium oxide (MAG-OX) 400 MG tablet Take 400 mg by mouth daily.   Yes Historical Provider, MD  megestrol (MEGACE) 40 MG tablet Take 1 tablet (40 mg total) by mouth daily. 09/30/16  Yes Lloyd Huger, MD  Multiple Vitamin (MULTIVITAMIN WITH MINERALS) TABS Take 1 tablet by mouth daily.   Yes Historical Provider, MD  omeprazole (PRILOSEC) 40 MG capsule Take 40 mg by mouth daily.  07/28/16  Yes Historical Provider, MD  prochlorperazine (COMPAZINE) 10 MG tablet Take 1 tablet (10 mg total) by mouth every 6 (six) hours as needed for nausea or vomiting. 10/28/16  Yes Lloyd Huger, MD  tiotropium (SPIRIVA) 18 MCG inhalation capsule Place 1 capsule (18 mcg total) into inhaler and inhale daily. 09/04/16  Yes Laverle Hobby, MD  traMADol (ULTRAM) 50 MG tablet Take 50 mg by mouth every  6 (six) hours as needed.   Yes Historical Provider, MD  vitamin E 400 UNIT capsule Take 400 Units by mouth daily.   Yes Historical Provider, MD    Allergies Pemetrexed and Codeine       Family History  Problem Relation Age of Onset  . Heart disease Father   . Diabetes Neg Hx   . Cancer Neg Hx     Social History      Social History  Substance Use Topics  . Smoking status: Former Smoker    Packs/day: 1.00    Years: 15.00    Quit date: 06/27/1997  .  Smokeless tobacco: Never Used  . Alcohol use No    Review of Systems  Constitutional: Negative for fever. Cardiovascular: Negative for chest pain. Respiratory: Positive for shortness of breath. Gastrointestinal: Negative for abdominal pain, vomiting and diarrhea. Genitourinary: Negative for dysuria. Musculoskeletal: Negative for back pain. Skin: Negative for rash. Neurological: Negative for headaches, focal weakness or numbness.   10-point ROS otherwise negative.  ____________________________________________   PHYSICAL EXAM:  VITAL SIGNS:     ED Triage Vitals  Enc Vitals Group     BP 10/12/2016 2212 (!) 157/107     Pulse Rate 10/11/2016 2212 (!) 151     Resp 10/31/2016 2218 (!) 27     Temp 10/10/2016 2227 97.2 F (36.2 C)     Temp src --      SpO2 11/05/2016 2212 94 %     Weight 10/28/2016 2243 158 lb (71.7 kg)     Height 10/20/2016 2243 '5\' 9"'$  (1.753 m)     Head Circumference --      Peak Flow --      Pain Score 10/20/2016 2255 0   Constitutional: Awake, alert. In severe respiratory distress.  Eyes: Conjunctivae are normal. Normal extraocular movements. ENT   Head: Normocephalic and atraumatic.   Nose: No congestion/rhinnorhea.   Mouth/Throat: Mucous membranes are moist.   Neck: No stridor. Hematological/Lymphatic/Immunilogical: No cervical lymphadenopathy. Cardiovascular: Tachycardic, regular rhythm.  No murmurs, rubs, or gallops.  Respiratory: Severe respiratory distress. Tachypneic.  Gastrointestinal: Soft and nontender. No distention.  Genitourinary: Deferred Musculoskeletal: Normal range of motion in all extremities. No lower extremity edema. Neurologic:  Normal speech and language. No gross focal neurologic deficits are appreciated.  Skin:  Skin is warm, dry and intact. No rash noted. Psychiatric: Mood and affect are normal. Speech and behavior are normal. Patient exhibits appropriate insight and  judgment.  ____________________________________________    LABS (pertinent positives/negatives)  Labs Reviewed  CBC WITH DIFFERENTIAL/PLATELET - Abnormal; Notable for the following:       Result Value   WBC 14.8 (*)    Hemoglobin 12.4 (*)    HCT 37.5 (*)    RDW 16.2 (*)    Platelets 575 (*)    Neutro Abs 9.1 (*)    Monocytes Absolute 1.5 (*)    Eosinophils Absolute 1.3 (*)    All other components within normal limits  BASIC METABOLIC PANEL - Abnormal; Notable for the following:    CO2 21 (*)    Glucose, Bld 180 (*)    All other components within normal limits  TROPONIN I     ____________________________________________   EKG  I, Nance Pear, attending physician, personally viewed and interpreted this EKG  EKG Time: 2312 Rate: 126 Rhythm: sinus tachycardia Axis: normal Intervals: qtc 487 QRS: narrow ST changes: no st elevation Impression: abnormal ekg   ____________________________________________    RADIOLOGY  CXR  IMPRESSION: Volume loss in the right lung and consolidation of the right lung apex has progressed from prior radiographs which may represent a combination of posttreatment changes, progressive malignancy, and/or pulmonary consolidation.  I, Johnaton Sonneborn, personally viewed and evaluated these images (plain radiographs) as part of my medical decision making, as well as reviewing the written report by the radiologist.  CTA PE pending ____________________________________________   PROCEDURES  Procedures CRITICAL CARE Performed by: Nance Pear   Total critical care time: 35 minutes  Critical care time was exclusive of separately billable procedures and treating other patients.  Critical care was necessary to treat or prevent imminent or life-threatening deterioration.  Critical care was time spent personally by me on the following activities: development of treatment plan with patient and/or surrogate as well  as nursing, discussions with consultants, evaluation of patient's response to treatment, examination of patient, obtaining history from patient or surrogate, ordering and performing treatments and interventions, ordering and review of laboratory studies, ordering and review of radiographic studies, pulse oximetry and re-evaluation of patient's condition.  ____________________________________________   INITIAL IMPRESSION / ASSESSMENT AND PLAN / ED COURSE  Pertinent labs & imaging results that were available during my care of the patient were reviewed by me and considered in my medical decision making (see chart for details).  Patient presents to the emergency department today in severe respiratory distress. Patient has a history of lung cancer. Home O2 sats were in the mid to high 80s. Patient was both tachycardic and tachypneic upon arrival to the emergency department. Given the level of respiratory distress did place patient on BiPAP. Given patient's anxiety and concern with BiPAP was given some Ativan. He seemed to respond well to the Ativan. Chest x-ray does show sequela of lung cancer. Think patient would benefit from CT angiogram to evaluate for PE given history of cancer. However wife does state that he was told that he has any airway collapse and this could be a big component of the patient's symptoms. Patient will require admission to the hospital for further workup and evaluation.  __________________________________   FINAL CLINICAL IMPRESSION(S) / ED DIAGNOSES  Final diagnoses:  Shortness of breath     Note: This dictation was prepared with Dragon dictation. Any transcriptional errors that result from this process are unintentional     Nance Pear, MD 11/03/16 1616

## 2016-11-02 NOTE — ED Notes (Signed)
Pt. Returned to tx. room in stable condition with no acute changes since departure from unit for scans.   

## 2016-11-02 NOTE — ED Notes (Signed)
Patient transported to CT 

## 2016-11-03 ENCOUNTER — Inpatient Hospital Stay: Payer: Medicare Other

## 2016-11-03 ENCOUNTER — Encounter: Payer: Self-pay | Admitting: Emergency Medicine

## 2016-11-03 DIAGNOSIS — J9690 Respiratory failure, unspecified, unspecified whether with hypoxia or hypercapnia: Secondary | ICD-10-CM | POA: Diagnosis present

## 2016-11-03 DIAGNOSIS — Z794 Long term (current) use of insulin: Secondary | ICD-10-CM

## 2016-11-03 DIAGNOSIS — J9601 Acute respiratory failure with hypoxia: Secondary | ICD-10-CM

## 2016-11-03 DIAGNOSIS — Z9221 Personal history of antineoplastic chemotherapy: Secondary | ICD-10-CM

## 2016-11-03 DIAGNOSIS — E46 Unspecified protein-calorie malnutrition: Secondary | ICD-10-CM | POA: Diagnosis present

## 2016-11-03 DIAGNOSIS — Z515 Encounter for palliative care: Secondary | ICD-10-CM | POA: Diagnosis present

## 2016-11-03 DIAGNOSIS — J188 Other pneumonia, unspecified organism: Secondary | ICD-10-CM | POA: Diagnosis present

## 2016-11-03 DIAGNOSIS — Z87891 Personal history of nicotine dependence: Secondary | ICD-10-CM

## 2016-11-03 DIAGNOSIS — E871 Hypo-osmolality and hyponatremia: Secondary | ICD-10-CM | POA: Diagnosis present

## 2016-11-03 DIAGNOSIS — E119 Type 2 diabetes mellitus without complications: Secondary | ICD-10-CM

## 2016-11-03 DIAGNOSIS — Z9911 Dependence on respirator [ventilator] status: Secondary | ICD-10-CM

## 2016-11-03 DIAGNOSIS — M1990 Unspecified osteoarthritis, unspecified site: Secondary | ICD-10-CM

## 2016-11-03 DIAGNOSIS — K219 Gastro-esophageal reflux disease without esophagitis: Secondary | ICD-10-CM | POA: Diagnosis present

## 2016-11-03 DIAGNOSIS — C3491 Malignant neoplasm of unspecified part of right bronchus or lung: Secondary | ICD-10-CM | POA: Diagnosis not present

## 2016-11-03 DIAGNOSIS — R918 Other nonspecific abnormal finding of lung field: Secondary | ICD-10-CM | POA: Diagnosis not present

## 2016-11-03 DIAGNOSIS — J9811 Atelectasis: Secondary | ICD-10-CM | POA: Diagnosis present

## 2016-11-03 DIAGNOSIS — Z85118 Personal history of other malignant neoplasm of bronchus and lung: Secondary | ICD-10-CM | POA: Diagnosis not present

## 2016-11-03 DIAGNOSIS — R0603 Acute respiratory distress: Secondary | ICD-10-CM

## 2016-11-03 DIAGNOSIS — Z79818 Long term (current) use of other agents affecting estrogen receptors and estrogen levels: Secondary | ICD-10-CM | POA: Diagnosis not present

## 2016-11-03 DIAGNOSIS — J9621 Acute and chronic respiratory failure with hypoxia: Secondary | ICD-10-CM | POA: Diagnosis present

## 2016-11-03 DIAGNOSIS — C3412 Malignant neoplasm of upper lobe, left bronchus or lung: Secondary | ICD-10-CM | POA: Diagnosis present

## 2016-11-03 DIAGNOSIS — C3411 Malignant neoplasm of upper lobe, right bronchus or lung: Secondary | ICD-10-CM | POA: Diagnosis present

## 2016-11-03 DIAGNOSIS — J90 Pleural effusion, not elsewhere classified: Secondary | ICD-10-CM

## 2016-11-03 DIAGNOSIS — C3401 Malignant neoplasm of right main bronchus: Secondary | ICD-10-CM

## 2016-11-03 DIAGNOSIS — D72829 Elevated white blood cell count, unspecified: Secondary | ICD-10-CM | POA: Diagnosis present

## 2016-11-03 DIAGNOSIS — Z885 Allergy status to narcotic agent status: Secondary | ICD-10-CM | POA: Diagnosis not present

## 2016-11-03 DIAGNOSIS — Z888 Allergy status to other drugs, medicaments and biological substances status: Secondary | ICD-10-CM | POA: Diagnosis not present

## 2016-11-03 DIAGNOSIS — R061 Stridor: Secondary | ICD-10-CM

## 2016-11-03 DIAGNOSIS — I4892 Unspecified atrial flutter: Secondary | ICD-10-CM | POA: Diagnosis present

## 2016-11-03 DIAGNOSIS — Z923 Personal history of irradiation: Secondary | ICD-10-CM | POA: Diagnosis not present

## 2016-11-03 DIAGNOSIS — E43 Unspecified severe protein-calorie malnutrition: Secondary | ICD-10-CM | POA: Diagnosis present

## 2016-11-03 DIAGNOSIS — Z79899 Other long term (current) drug therapy: Secondary | ICD-10-CM

## 2016-11-03 DIAGNOSIS — Z66 Do not resuscitate: Secondary | ICD-10-CM

## 2016-11-03 DIAGNOSIS — Z6833 Body mass index (BMI) 33.0-33.9, adult: Secondary | ICD-10-CM | POA: Diagnosis not present

## 2016-11-03 LAB — BASIC METABOLIC PANEL
ANION GAP: 9 (ref 5–15)
BUN: 14 mg/dL (ref 6–20)
CHLORIDE: 105 mmol/L (ref 101–111)
CO2: 22 mmol/L (ref 22–32)
Calcium: 8.9 mg/dL (ref 8.9–10.3)
Creatinine, Ser: 1 mg/dL (ref 0.61–1.24)
GFR calc non Af Amer: >60 mL/min (ref >60)
Glucose, Bld: 189 mg/dL — ABNORMAL HIGH (ref 65–99)
Potassium: 3.9 mmol/L (ref 3.5–5.1)
Sodium: 136 mmol/L (ref 135–145)

## 2016-11-03 LAB — BLOOD GAS, ARTERIAL
Acid-base deficit: 2.4 mmol/L — ABNORMAL HIGH (ref 0.0–2.0)
Acid-base deficit: 5.4 mmol/L — ABNORMAL HIGH (ref 0.0–2.0)
BICARBONATE: 23.2 mmol/L (ref 20.0–28.0)
Bicarbonate: 20 mmol/L (ref 20.0–28.0)
Delivery systems: POSITIVE
EXPIRATORY PAP: 6
FIO2: 0.4
FIO2: 0.3
Inspiratory PAP: 12
Mechanical Rate: 8
Mechanical Rate: 14
O2 SAT: 96.5 %
O2 SAT: 96.5 %
PATIENT TEMPERATURE: 37
PATIENT TEMPERATURE: 37
PCO2 ART: 38 mmHg (ref 32.0–48.0)
PEEP: 5 cmH2O
PO2 ART: 90 mmHg (ref 83.0–108.0)
PO2 ART: 92 mmHg (ref 83.0–108.0)
VT: 570 mL
pCO2 arterial: 42 mmHg (ref 32.0–48.0)
pH, Arterial: 7.35 (ref 7.350–7.450)
pH, Arterial: 7.33 — ABNORMAL LOW (ref 7.350–7.450)

## 2016-11-03 LAB — CBC
HEMATOCRIT: 34.9 % — AB (ref 40.0–52.0)
HEMOGLOBIN: 11.8 g/dL — AB (ref 13.0–18.0)
MCH: 27 pg (ref 26.0–34.0)
MCHC: 33.7 g/dL (ref 32.0–36.0)
MCV: 80.2 fL (ref 80.0–100.0)
Platelets: 366 10*3/uL (ref 150–440)
RBC: 4.36 MIL/uL — ABNORMAL LOW (ref 4.40–5.90)
RDW: 16.4 % — ABNORMAL HIGH (ref 11.5–14.5)
WBC: 7 10*3/uL (ref 3.8–10.6)

## 2016-11-03 LAB — URINALYSIS COMPLETE WITH MICROSCOPIC (ARMC ONLY)
Bilirubin Urine: NEGATIVE
Glucose, UA: NEGATIVE mg/dL
Hgb urine dipstick: NEGATIVE
Leukocytes, UA: NEGATIVE
Nitrite: NEGATIVE
PROTEIN: 30 mg/dL — AB
Specific Gravity, Urine: >1.06 — ABNORMAL HIGH (ref 1.005–1.030)
pH: 5 (ref 5.0–8.0)

## 2016-11-03 LAB — GLUCOSE, CAPILLARY
GLUCOSE-CAPILLARY: 232 mg/dL — AB (ref 65–99)
GLUCOSE-CAPILLARY: 213 mg/dL — AB (ref 65–99)
GLUCOSE-CAPILLARY: 161 mg/dL — AB (ref 65–99)
GLUCOSE-CAPILLARY: 184 mg/dL — AB (ref 65–99)
Glucose-Capillary: 229 mg/dL — ABNORMAL HIGH (ref 65–99)

## 2016-11-03 LAB — BODY FLUID CELL COUNT WITH DIFFERENTIAL
Eos, Fluid: 3 %
Lymphs, Fluid: 43 %
MONOCYTE-MACROPHAGE-SEROUS FLUID: 7 %
Neutrophil Count, Fluid: 47 %
OTHER CELLS FL: 0 %
Total Nucleated Cell Count, Fluid: 4949 cu mm

## 2016-11-03 LAB — MRSA PCR SCREENING: MRSA BY PCR: NEGATIVE

## 2016-11-03 LAB — PHOSPHORUS: PHOSPHORUS: 4.2 mg/dL (ref 2.5–4.6)

## 2016-11-03 LAB — MAGNESIUM: Magnesium: 1.9 mg/dL (ref 1.7–2.4)

## 2016-11-03 LAB — PROCALCITONIN: Procalcitonin: 0.11 ng/mL

## 2016-11-03 MED ORDER — FENTANYL 2500MCG IN NS 250ML (10MCG/ML) PREMIX INFUSION: 25.0000 ug/h | INTRAVENOUS | Status: DC

## 2016-11-03 MED ORDER — FENTANYL 2500MCG IN NS 250ML (10MCG/ML) PREMIX INFUSION
25.0000 ug/h | INTRAVENOUS | Status: DC
  Administered 2016-11-03: 250 ug/h via INTRAVENOUS
  Administered 2016-11-03: 25 ug/h via INTRAVENOUS
  Administered 2016-11-03: 250 ug/h via INTRAVENOUS
  Administered 2016-11-03: 350 ug/h via INTRAVENOUS
  Administered 2016-11-04: 300 ug/h via INTRAVENOUS
  Administered 2016-11-04: 35 ug/h via INTRAVENOUS
  Administered 2016-11-04 – 2016-11-05 (×2): 300 ug/h via INTRAVENOUS
  Filled 2016-11-03 (×6): qty 250

## 2016-11-03 MED ORDER — PHENYLEPHRINE HCL 10 MG/ML IJ SOLN
0.0000 ug/min | INTRAMUSCULAR | Status: DC
  Administered 2016-11-03: 45 ug/min via INTRAVENOUS
  Administered 2016-11-03: 40 ug/min via INTRAVENOUS
  Administered 2016-11-03: 60 ug/min via INTRAVENOUS
  Administered 2016-11-03 (×2): 50 ug/min via INTRAVENOUS
  Administered 2016-11-04: 30 ug/min via INTRAVENOUS
  Administered 2016-11-04 (×2): 40 ug/min via INTRAVENOUS
  Administered 2016-11-04: 20 ug/min via INTRAVENOUS
  Filled 2016-11-03 (×10): qty 1

## 2016-11-03 MED ORDER — IPRATROPIUM-ALBUTEROL 0.5-2.5 (3) MG/3ML IN SOLN
3.0000 mL | RESPIRATORY_TRACT | Status: DC
  Administered 2016-11-03 – 2016-11-07 (×25): 3 mL via RESPIRATORY_TRACT
  Filled 2016-11-03 (×24): qty 3

## 2016-11-03 MED ORDER — FAMOTIDINE IN NACL 20-0.9 MG/50ML-% IV SOLN
20.0000 mg | Freq: Two times a day (BID) | INTRAVENOUS | Status: DC
  Administered 2016-11-03 – 2016-11-07 (×9): 20 mg via INTRAVENOUS
  Filled 2016-11-03 (×9): qty 50

## 2016-11-03 MED ORDER — MIDAZOLAM HCL 2 MG/2ML IJ SOLN
INTRAMUSCULAR | Status: AC
  Administered 2016-11-03: 4 mg via INTRAVENOUS
  Filled 2016-11-03: qty 4

## 2016-11-03 MED ORDER — LORAZEPAM 2 MG/ML IJ SOLN
1.0000 mg | Freq: Once | INTRAMUSCULAR | Status: AC
  Administered 2016-11-03: 1 mg via INTRAVENOUS

## 2016-11-03 MED ORDER — ORAL CARE MOUTH RINSE
15.0000 mL | OROMUCOSAL | Status: DC
  Administered 2016-11-03 – 2016-11-07 (×45): 15 mL via OROMUCOSAL

## 2016-11-03 MED ORDER — SODIUM CHLORIDE 0.9 % IV BOLUS (SEPSIS)
1000.0000 mL | Freq: Once | INTRAVENOUS | Status: AC
  Administered 2016-11-03: 1000 mL via INTRAVENOUS

## 2016-11-03 MED ORDER — SODIUM CHLORIDE 0.9 % IV BOLUS (SEPSIS)
500.0000 mL | Freq: Once | INTRAVENOUS | Status: AC
  Administered 2016-11-03: 500 mL via INTRAVENOUS

## 2016-11-03 MED ORDER — CHLORHEXIDINE GLUCONATE 0.12% ORAL RINSE (MEDLINE KIT): 15.0000 mL | Freq: Two times a day (BID) | OROMUCOSAL | Status: DC

## 2016-11-03 MED ORDER — ROCURONIUM BROMIDE 50 MG/5ML IV SOLN
INTRAVENOUS | Status: AC
  Filled 2016-11-03: qty 1

## 2016-11-03 MED ORDER — ORAL CARE MOUTH RINSE: 15.0000 mL | Freq: Four times a day (QID) | OROMUCOSAL | Status: DC

## 2016-11-03 MED ORDER — CEFEPIME-DEXTROSE 2 GM/50ML IV SOLR
2.0000 g | Freq: Three times a day (TID) | INTRAVENOUS | Status: DC
  Administered 2016-11-03 – 2016-11-05 (×6): 2 g via INTRAVENOUS
  Filled 2016-11-03 (×8): qty 50

## 2016-11-03 MED ORDER — MIDAZOLAM HCL 2 MG/2ML IJ SOLN
1.0000 mg | INTRAMUSCULAR | Status: AC | PRN
  Administered 2016-11-03 – 2016-11-04 (×3): 1 mg via INTRAVENOUS
  Filled 2016-11-03: qty 2

## 2016-11-03 MED ORDER — DILTIAZEM HCL 100 MG IV SOLR
5.0000 mg/h | INTRAVENOUS | Status: DC
  Administered 2016-11-03: 10 mg/h via INTRAVENOUS
  Filled 2016-11-03: qty 100

## 2016-11-03 MED ORDER — ENOXAPARIN SODIUM 40 MG/0.4ML ~~LOC~~ SOLN
40.0000 mg | SUBCUTANEOUS | Status: DC
  Administered 2016-11-03 – 2016-11-07 (×5): 40 mg via SUBCUTANEOUS
  Filled 2016-11-03 (×5): qty 0.4

## 2016-11-03 MED ORDER — LORAZEPAM 2 MG/ML IJ SOLN
INTRAMUSCULAR | Status: AC
  Administered 2016-11-03: 1 mg via INTRAVENOUS
  Filled 2016-11-03: qty 1

## 2016-11-03 MED ORDER — FENTANYL 2500MCG IN NS 250ML (10MCG/ML) PREMIX INFUSION
INTRAVENOUS | Status: AC
  Administered 2016-11-03: 25 ug/h via INTRAVENOUS
  Filled 2016-11-03: qty 250

## 2016-11-03 MED ORDER — FENTANYL CITRATE (PF) 100 MCG/2ML IJ SOLN
100.0000 ug | Freq: Once | INTRAMUSCULAR | Status: AC
  Administered 2016-11-03: 100 ug via INTRAVENOUS

## 2016-11-03 MED ORDER — AMIODARONE HCL IN DEXTROSE 360-4.14 MG/200ML-% IV SOLN: 30.0000 mg/h | INTRAVENOUS | Status: DC

## 2016-11-03 MED ORDER — INSULIN ASPART 100 UNIT/ML ~~LOC~~ SOLN
2.0000 [IU] | SUBCUTANEOUS | Status: DC
  Administered 2016-11-03: 4 [IU] via SUBCUTANEOUS
  Administered 2016-11-03 (×2): 6 [IU] via SUBCUTANEOUS
  Administered 2016-11-03 – 2016-11-05 (×9): 4 [IU] via SUBCUTANEOUS
  Administered 2016-11-05 (×3): 2 [IU] via SUBCUTANEOUS
  Administered 2016-11-06: 4 [IU] via SUBCUTANEOUS
  Administered 2016-11-06 – 2016-11-07 (×9): 2 [IU] via SUBCUTANEOUS
  Filled 2016-11-03 (×2): qty 2
  Filled 2016-11-03 (×2): qty 4
  Filled 2016-11-03 (×2): qty 2
  Filled 2016-11-03: qty 4
  Filled 2016-11-03: qty 6
  Filled 2016-11-03: qty 4
  Filled 2016-11-03: qty 1
  Filled 2016-11-03: qty 2
  Filled 2016-11-03: qty 3
  Filled 2016-11-03: qty 2
  Filled 2016-11-03 (×2): qty 4
  Filled 2016-11-03: qty 6
  Filled 2016-11-03: qty 4
  Filled 2016-11-03 (×4): qty 2
  Filled 2016-11-03 (×2): qty 4
  Filled 2016-11-03 (×2): qty 2
  Filled 2016-11-03: qty 4

## 2016-11-03 MED ORDER — DEXTROSE 5 % IV SOLN: 2.0000 g | Freq: Three times a day (TID) | INTRAVENOUS | Status: DC

## 2016-11-03 MED ORDER — VITAL HIGH PROTEIN PO LIQD
1000.0000 mL | ORAL | Status: DC
  Administered 2016-11-04: 1000 mL
  Administered 2016-11-04: 07:00:00
  Administered 2016-11-04: 1000 mL
  Administered 2016-11-05 (×2)
  Administered 2016-11-05: 1000 mL
  Administered 2016-11-05: 18:00:00
  Administered 2016-11-06 (×2): 1000 mL

## 2016-11-03 MED ORDER — DILTIAZEM HCL 25 MG/5ML IV SOLN
INTRAVENOUS | Status: AC
  Administered 2016-11-03: 10 mg via INTRAVENOUS
  Filled 2016-11-03: qty 5

## 2016-11-03 MED ORDER — AMIODARONE HCL IN DEXTROSE 360-4.14 MG/200ML-% IV SOLN
60.0000 mg/h | INTRAVENOUS | Status: DC
  Administered 2016-11-03: 60 mg/h via INTRAVENOUS
  Filled 2016-11-03: qty 200

## 2016-11-03 MED ORDER — FAMOTIDINE 40 MG/5ML PO SUSR
20.0000 mg | Freq: Two times a day (BID) | ORAL | Status: DC
  Filled 2016-11-03: qty 2.5

## 2016-11-03 MED ORDER — ROCURONIUM BROMIDE 50 MG/5ML IV SOLN
50.0000 mg | Freq: Once | INTRAVENOUS | Status: AC
  Administered 2016-11-03: 50 mg via INTRAVENOUS

## 2016-11-03 MED ORDER — SODIUM CHLORIDE 0.9 % IV SOLN: 250.0000 mL | INTRAVENOUS | Status: DC | PRN

## 2016-11-03 MED ORDER — CHLORHEXIDINE GLUCONATE 0.12% ORAL RINSE (MEDLINE KIT)
15.0000 mL | Freq: Two times a day (BID) | OROMUCOSAL | Status: DC
  Administered 2016-11-03 – 2016-11-07 (×9): 15 mL via OROMUCOSAL

## 2016-11-03 MED ORDER — MIDAZOLAM HCL 2 MG/2ML IJ SOLN
1.0000 mg | INTRAMUSCULAR | Status: DC | PRN
  Administered 2016-11-03 – 2016-11-06 (×8): 1 mg via INTRAVENOUS
  Filled 2016-11-03 (×6): qty 2
  Filled 2016-11-03: qty 4
  Filled 2016-11-03 (×4): qty 2

## 2016-11-03 MED ORDER — AMIODARONE LOAD VIA INFUSION
150.0000 mg | Freq: Once | INTRAVENOUS | Status: AC
  Administered 2016-11-03: 150 mg via INTRAVENOUS
  Filled 2016-11-03: qty 83.34

## 2016-11-03 MED ORDER — FENTANYL BOLUS VIA INFUSION
25.0000 ug | INTRAVENOUS | Status: DC | PRN
  Administered 2016-11-03 – 2016-11-06 (×12): 25 ug via INTRAVENOUS
  Filled 2016-11-03: qty 25

## 2016-11-03 MED ORDER — BUDESONIDE 0.5 MG/2ML IN SUSP
0.5000 mg | Freq: Two times a day (BID) | RESPIRATORY_TRACT | Status: DC
  Administered 2016-11-03 – 2016-11-07 (×9): 0.5 mg via RESPIRATORY_TRACT
  Filled 2016-11-03 (×9): qty 2

## 2016-11-03 MED ORDER — DILTIAZEM HCL 25 MG/5ML IV SOLN
10.0000 mg | Freq: Once | INTRAVENOUS | Status: AC
  Administered 2016-11-03: 10 mg via INTRAVENOUS

## 2016-11-03 MED ORDER — SENNOSIDES 8.8 MG/5ML PO SYRP: 5.0000 mL | ORAL_SOLUTION | Freq: Two times a day (BID) | ORAL | Status: DC | PRN

## 2016-11-03 MED ORDER — VITAL HIGH PROTEIN PO LIQD
1000.0000 mL | ORAL | Status: DC
  Administered 2016-11-03: 05:00:00
  Administered 2016-11-03: 1000 mL

## 2016-11-03 MED ORDER — FENTANYL CITRATE (PF) 100 MCG/2ML IJ SOLN: 50.0000 ug | Freq: Once | INTRAMUSCULAR | Status: DC

## 2016-11-03 MED ORDER — MIDAZOLAM HCL 2 MG/2ML IJ SOLN
4.0000 mg | Freq: Once | INTRAMUSCULAR | Status: AC
  Administered 2016-11-03: 4 mg via INTRAVENOUS

## 2016-11-03 MED ORDER — PRO-STAT SUGAR FREE PO LIQD
30.0000 mL | Freq: Every day | ORAL | Status: DC
  Administered 2016-11-04 – 2016-11-07 (×4): 30 mL

## 2016-11-03 MED ORDER — FENTANYL CITRATE (PF) 100 MCG/2ML IJ SOLN
INTRAMUSCULAR | Status: AC
  Filled 2016-11-03: qty 2

## 2016-11-03 MED ORDER — IOPAMIDOL (ISOVUE-300) INJECTION 61%
15.0000 mL | INTRAVENOUS | Status: AC
  Administered 2016-11-03 (×2): 15 mL via ORAL

## 2016-11-03 MED ORDER — FENTANYL BOLUS VIA INFUSION
25.0000 ug | INTRAVENOUS | Status: DC | PRN
  Filled 2016-11-03: qty 25

## 2016-11-03 MED ORDER — METHYLPREDNISOLONE SODIUM SUCC 125 MG IJ SOLR
60.0000 mg | Freq: Two times a day (BID) | INTRAMUSCULAR | Status: DC
  Administered 2016-11-03 – 2016-11-04 (×4): 60 mg via INTRAVENOUS
  Filled 2016-11-03 (×4): qty 2

## 2016-11-03 NOTE — Procedures (Signed)
CHEST ULTRASOUND:   Both lungs were scanned in the mid scapular lines from base to apex. Left lung showed normal sliding lung with occasional B lines. Scanning of the right lung revealed a small to moderate free flowing pleural effusion which extended from the base to 1/5th way up the right lung with a moderate "curtain sign" due to significant respiratory effort:  Impression:  Small to moderate right pleural effusion. Moderate risk of thoracentesis given positive curtain sign.   Recommendations:  Thoracentesis can be performed but given its size uncertain that it would provide great relief of dyspnea.   Ashby Dawes, M.D.  11/03/2016

## 2016-11-03 NOTE — Procedures (Signed)
US guided right thoracentesis.  Removed 1 liter of amber colored fluid.  No immediate complication.  See full report in PACS.

## 2016-11-03 NOTE — Progress Notes (Signed)
I have had long discussions with wife and family. I have discussed with Dr Vinetta Bergamo with CardioThoracic Surgery at [facility] and Dr. Roxan Hockey with Cardiothoracic Surgery at [facility], they both agree that there is NO role for airway stenting, there will be more complications then benefit in setting of progressive adenocarcinoma.  Wife has been updated and had been notified of these discussion.   Patient has had progressive resp decline and physical functional decline along with poor appetitive and progressive weight loss.  I have discussed code status with wife and family and they have agreed to make patient DNR status.  Next plan of action is to proceed with RT lung Thoracentesis today Then Obtain CT head and CT abd/pelvis in AM  Will also contact Dr. Oleta Mouse as requested by family    Corrin Parker, M.D.  [facility]  Medical Director [place] Director [facility] Cardio-Pulmonary Department

## 2016-11-03 NOTE — Progress Notes (Signed)
Emergent intubation at bedside by Dr. Ashby Dawes. RT and this RN present at bedside. ET tube size 8 placed 25 at lips. VSS post intubation with no signs of distress. See MAR for medication details. Patient wife at bedside now and updated on patient condition. Chaplain present if needed. Patient currently resting in bed with no signs of acute distress at present. Will continue to monitor.

## 2016-11-03 NOTE — Progress Notes (Signed)
[place] Progress Note Patient Name: Christopher Huber DOB: Mar 20, 1946 MRN: [id number]   Date of Service  11/03/2016  HPI/Events of Note  CT reviewed Now ETT placed by PCCM ABG last reviewed Tachy now  eICU Interventions  ecg for rhythm id Bolus Add fent     Intervention Category Evaluation Type: New Patient Evaluation  Raylene Miyamoto. 11/03/2016, 4:49 AM

## 2016-11-03 NOTE — Progress Notes (Signed)
thoracentesis preformed at bedside by  Dr Anselm Pancoast, [place] tech and this rn present. Pt tolerated procedure well, vss, rn will continue to monitor and assess.

## 2016-11-03 NOTE — Progress Notes (Signed)
Patient restless in bed and attempting to pull at ET tube. PRN fentanyl bolus previously given at 0504 with no relief. Will increase fentanyl drip and give prn versed as ordered. See MAR for further details. Will continue to monitor.

## 2016-11-03 NOTE — Progress Notes (Signed)
Dr. Ashby Dawes at bedside for intubation. Rn and RT present.

## 2016-11-03 NOTE — H&P (Signed)
[facility] Consultation     ASSESSMENT/PLAN   70 yo male with recurrent lung cancer with acute respiratory failure, likely due to right airway obstruction with stridor. Also right pleural effusion.   PULMONARY A:Acute hypoxic resp failure.  Right pleural effusion. 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  RLL atelectasis due to above.  --Intubated due to stridor and increased work of breathing.  -start steroids  Dr Ashby Dawes Discussion with family: --Order IR Thoracentesis.  ----Discussed mechanical ventilation due to his severe dyspnea, he understands that intubation may be a terminal event.He was intubated due to stridor and wob.  --Discussed various options for palliation including thoracentesis, and airway stenting, he understands that he would have to be transferred to a tertiary center for stent.  --Discussed palliative thoracentesis, his pleural effusion is not large, but we will order a thoracentesis to see if this helps with the breathing. --May need bronchoscopy to evaluate airway for narrowing.   CARDIOVASCULAR A: Atrial flutter P:  May be contributing to dyspnea, but likely secondary to respiratory failure.  --Will start amiodarone as pt's BP is borderline.    GASTROINTESTINAL A:  Famotidine.   HEMATOLOGIC A:  Leukocytosis.   INFECTIOUS A:  Doubt infection.  Post obstructive pneumonitis -hold abx for now  Micro/culture results:  BCx2 -- UC -- Sputum--  Antibiotics:   ENDOCRINE A: Monitor blood sugars.   NEUROLOGIC A:  --   MAJOR EVENTS/TEST RESULTS:   Best Practices  DVT Prophylaxis: lovenox.  GI Prophylaxis: famotidine.    --------------------------------------- Advance care planning:Dr Ashby Dawes 11/27 AM Discussed code status and wishes with patient had wife. Patient would like to do something that will  make his breathing feel better, he understands that he has a terminal disease, but would like to seek continued options help him live longer and feel better. - Wife understands that there may come a point where that will no longer be possible.  -We had a long discussion about stenting and several stents to get there, and that he will need to be evaluated at a tertiary center to see if he is a candidate.  We discussed thoracentesis which might help his symptoms.  We discussed intubation, which he favored because he felt that he could not go on breathing like he is now. - They understand that if he is intubated that he may not never be extubated.  We decided that we will intubate, and try thoracentesis if possible and arrange for transfer to tertiary center.  30 min spent in discussion.  ---------------------------------------   Name: Christopher Huber MRN: [id number] DOB: 07/19/1946    ADMISSION DATE:  10/08/2016  CHIEF COMPLAINT:  Dyspnea   HISTORY OF PRESENT ILLNESS:   Patient is a 70 yo with a history of with left upper lobe adenocarcinoma in 2014 and received 8 cycles of concurrent weekly carboplatinum and Taxol along with XRT between February 17, 2013 and Apr 07, 2013. He was recently diagnosed with recurrent bilateral adenoca, and opted to continued aggressive care.   The patient is on 3L oxygen at baseline, he presents with progressive dyspnea. Review of labs unremarkable other then leukocytosis. Review of imaging shows moderate right pleural effusion with some compressive atelectasis and left sided radiation changes. Right sided cancer with compression of the right bronchus intermedius.   On speaking with the patient he has loud stridor, even audible outside of the room, and  he is using his belly muscles to breathe even while on bipap. He and his wife note that he has been having progressive dyspnea over the past month, however this really advanced over the past 2 days.  Wife notes that he is  on 3L at home, and over the past week he was getting very winded with minor activity, his oxygen would drop into the 80's even with minor activity such as brushing his teeth.   SUBJECTIVE Remains intubated,sedated On full vent  Support   REVIEW OF SYSTEMS:   Could not obtain due to dyspnea.    VITAL SIGNS: Temp:  [97.2 F (36.2 C)-98.1 F (36.7 C)] 98.1 F (36.7 C) (11/27 0800) Pulse Rate:  [85-152] 85 (11/27 0800) Resp:  [10-27] 14 (11/27 0800) BP: (72-168)/(52-107) 93/69 (11/27 0800) SpO2:  [86 %-100 %] 100 % (11/27 0800) FiO2 (%):  [30 %-40 %] 30 % (11/27 0800) Weight:  [158 lb (71.7 kg)-159 lb 2.8 oz (72.2 kg)] 159 lb 2.8 oz (72.2 kg) (11/27 0249) HEMODYNAMICS:   VENTILATOR SETTINGS: Vent Mode: PRVC FiO2 (%):  [30 %-40 %] 30 % Set Rate:  [14 bmp] 14 bmp Vt Set:  [570 mL] 570 mL PEEP:  [5 cmH20] 5 cmH20 INTAKE / OUTPUT:  Intake/Output Summary (Last 24 hours) at 11/03/16 0833 Last data filed at 11/03/16 0500  Gross per 24 hour  Intake            19.92 ml  Output              175 ml  Net          -155.08 ml    Physical Examination:   VS: BP 93/69 (BP Location: Left Arm)   Pulse 85   Temp 98.1 F (36.7 C) (Oral)   Resp 14   Ht '5\' 9"'$  (1.753 m)   Wt 159 lb 2.8 oz (72.2 kg)   SpO2 100%   BMI 23.51 kg/m   General Appearance: +distress, on vent Neuro:GCS<8T HEENT: PERRLA, EOM intact, no ptosis. Pulmonary: Decreased air entry in RLL, diaphragmatic excursion normal. CardiovascularNormal S1,S2.  No m/r/g.    Abdomen: Benign, Soft, non-tender, No masses, hepatosplenomegaly, No lymphadenopathy Renal:  No costovertebral tenderness  GU:  Not performed at this time. Endoc: No evident thyromegaly, no signs of acromegaly. Skin:   warm, no rashes, no ecchymosis  Extremities: normal, no cyanosis, clubbing, no edema, warm with normal capillary refill.    LABS: Reviewed   LABORATORY PANEL:   CBC  Recent Labs Lab 11/03/16 0413  WBC 7.0  HGB 11.8*  HCT 34.9*    PLT 366    Chemistries   Recent Labs Lab 10/28/16 0921  11/03/16 0413  NA 137  < > 136  K 3.6  < > 3.9  CL 101  < > 105  CO2 26  < > 22  GLUCOSE 104*  < > 189*  BUN 12  < > 14  CREATININE 0.93  < > 1.00  CALCIUM 9.2  < > 8.9  MG  --   --  1.9  PHOS  --   --  4.2  AST 18  --   --   ALT 11*  --   --   ALKPHOS 74  --   --   BILITOT 0.5  --   --   < > = values in this interval not displayed.   Recent Labs Lab 11/03/16 0732  GLUCAP 229*    Recent Labs Lab 11/03/16  0153 11/03/16 0423  PHART 7.35 7.33*  PCO2ART 42 38  PO2ART 90 92    Recent Labs Lab 10/28/16 0921  AST 18  ALT 11*  ALKPHOS 74  BILITOT 0.5  ALBUMIN 3.4*    Cardiac Enzymes  Recent Labs Lab 10/13/2016 2215  TROPONINI <0.03    RADIOLOGY:  Dg Chest 1 View  Result Date: 11/03/2016 CLINICAL DATA:  Endotracheal tube placement EXAM: CHEST 1 VIEW COMPARISON:  10/25/2016 FINDINGS: An endotracheal tube was placed with tip measuring 4.1 cm above the carina. Enteric tube tip is localized in the left upper quadrant consistent with location in the upper stomach. Proximal side hole is near the location of the EG junction. Right central venous catheter with tip over the low SVC region. No pneumothorax. Right upper lobe collapse and consolidation with less prominent left upper lobe collapse and consolidation, progressing since previous study. Linear scarring in the left mid lung. Small right pleural effusion. Normal heart size. IMPRESSION: Appliances positioned as described with enteric tube tip in the left upper quadrant but proximal side hole near the EG junction. Endotracheal tube is in satisfactory position. Progressive bilateral upper lobe collapse and consolidation. Electronically Signed   By: Lucienne Capers M.D.   On: 11/03/2016 04:04   Dg Abd 1 View  Result Date: 11/03/2016 CLINICAL DATA:  NG placement EXAM: ABDOMEN - 1 VIEW COMPARISON:  None. FINDINGS: Enteric tube is present with tip in the left  upper quadrant consistent with location in the upper stomach. Proximal side hole is projected over the expected location of the EG junction and may be in the lower esophagus. Consider advancement. Elevation of the right hemidiaphragm with small right pleural effusion. Residual contrast material in the urinary tract. IMPRESSION: Enteric tube tip is in the left upper quadrant the proximal side hole projects over the expected location of the EG junction, possibly in the lower esophagus. Consider advancing the tube for better placement. Electronically Signed   By: Lucienne Capers M.D.   On: 11/03/2016 04:05   Ct Angio Chest Pe W And/or Wo Contrast  Result Date: 11/03/2016 CLINICAL DATA:  Difficulty breathing and shortness of breath. History of right lung cancer. EXAM: CT ANGIOGRAPHY CHEST WITH CONTRAST TECHNIQUE: Multidetector CT imaging of the chest was performed using the standard protocol during bolus administration of intravenous contrast. Multiplanar CT image reconstructions and MIPs were obtained to evaluate the vascular anatomy. CONTRAST:  75 mL Isovue 370 COMPARISON:  05/07/2016 FINDINGS: Cardiovascular: Good opacification of the central and segmental pulmonary arteries. No focal filling defects. No evidence of significant pulmonary embolus. Normal heart size. Moderate-sized pericardial effusion. Normal caliber thoracic aorta with scattered calcifications. Mediastinum/Nodes: Esophagus is decompressed. Probable lymphadenopathy in the right posterior hilum causing extrinsic narrowing of the bronchus intermedius. This is progressing since previous study. Lungs/Pleura: There is collapse and consolidation of the right upper lung with abrupt termination of the right upper lung bronchus suggesting an obstructing mass or endobronchial lesion. Areas of linear atelectasis demonstrated in the right middle lung. Calcified right pleural plaque. Left upper lung is collapsed with air bronchograms. Can't exclude an  endobronchial lesion. Hyperinflation of left lower lobe. Large right pleural effusion. No left pleural effusion. No pneumothorax. Upper Abdomen: No acute abnormality. Musculoskeletal: No chest wall abnormality. No acute or significant osseous findings. Review of the MIP images confirms the above findings. IMPRESSION: 1. Collapse and consolidation of the right upper lung with abrupt termination of right upper lung bronchus suggesting an obstructing mass or endobronchial lesion. Right  hilar lymphadenopathy causing extrinsic compression of the right bronchus intermedius. Large right pleural effusion. 2. Collapse and consolidation of the left upper lung with compensatory hyperinflation of the left lower lung. Can't exclude an endobronchial lesion urethra. 3. Pericardial effusion. No evidence of significant pulmonary embolus. Electronically Signed   By: Lucienne Capers M.D.   On: 11/03/2016 00:13   Dg Chest Port 1 View  Result Date: 10/29/2016 CLINICAL DATA:  70 y/o M; respiratory distress and extreme shortness of breath. History of lung cancer. EXAM: PORTABLE CHEST 1 VIEW COMPARISON:  09/04/2016 chest radiograph. FINDINGS: Volume loss in the right lung and consolidation of the right lung apex has progressed from prior radiographs which may represent a combination of posttreatment changes, progressive malignancy, and/or pulmonary consolidation. Stable cardiac silhouette given projection and technique. Left mid lung zone linear scarring is unchanged. Blunted right costal diaphragmatic angle may represent a small effusion. Right port catheter tip projects over lower SVC. Bones are unremarkable. IMPRESSION: Volume loss in the right lung and consolidation of the right lung apex has progressed from prior radiographs which may represent a combination of posttreatment changes, progressive malignancy, and/or pulmonary consolidation. Electronically Signed   By: Kristine Garbe M.D.   On: 10/10/2016 22:56    I  have personally obtained a history, examined the patient, evaluated Pertinent laboratory and RadioGraphic/imaging results, and  formulated the assessment and plan   The Patient requires high complexity decision making for assessment and support, frequent evaluation and titration of therapies, application of advanced monitoring technologies and extensive interpretation of multiple databases. Critical Care Time devoted to patient care services described in this note is 45 minutes.   Overall, patient is critically ill, prognosis is guarded.  Patient with Multiorgan failure and at high risk for cardiac arrest and death.  Would recommend DNR status, plan for transfer to tertiary care center to assess for bronchial stenting if family wants.    Corrin Parker, M.D.  [facility]  Medical Director [place] Director [facility] Cardio-Pulmonary Department

## 2016-11-03 NOTE — Progress Notes (Signed)
Initial Nutrition Assessment  DOCUMENTATION CODES:   Severe malnutrition in context of acute illness/injury  INTERVENTION:  -Recommend starting Vital High Protein at rate of 60 ml/hr with Prostat daily providing 142 g protein, 1540 kcals and 1210 mL of free water. PEPuP initiated. Continue to assess  NUTRITION DIAGNOSIS:   Malnutrition related to acute illness, chronic illness as evidenced by percent weight loss, energy intake < or equal to 50% for > or equal to 5 days.  GOAL:   Provide needs based on ASPEN/SCCM guidelines  MONITOR:   Vent status, Labs, Weight trends  REASON FOR ASSESSMENT:   Ventilator, Consult Enteral/tube feeding initiation and management  ASSESSMENT:   70 yo male admitted with acute respiratory failure with right pleural effusion, recurrent cancer with atelectasis, right airway obstruction   Patient is currently intubated on ventilator support MV: 8.1 L/min Temp (24hrs), Avg:97.8 F (36.6 C), Min:97.2 F (36.2 C), Max:98.1 F (36.7 C)  Noted OG tube with side port in esophagus, OG advanced and repeat xray pending. Plan to start TF once correct placement confirmed and after thoracentesis today  Pt with 30 pound wt loss over the past month; 15.9% wt loss. Per family, pt quit eating over the past month to month and a half. Pt quit eating because it was not a pleasant experience for him; pt would cough and gag every time he ate something. Pt also became very SOB.   Labs: reviewed Meds: reviewed  Diet Order:  Diet NPO time specified  Skin:  Reviewed, no issues  Last BM:  11/26  Height:   Ht Readings from Last 1 Encounters:  11/03/16 '5\' 9"'$  (1.753 m)    Weight:   Wt Readings from Last 1 Encounters:  11/03/16 159 lb 2.8 oz (72.2 kg)    BMI:  Body mass index is 23.51 kg/m.  Estimated Nutritional Needs:   Kcal:  9012 kcals  Protein:  108-144 g  Fluid:  >/= 1.5 L  EDUCATION NEEDS:   No education needs identified at this time  [place], [place], [place] [phone] Pager  [phone] Weekend/On-Call Pager

## 2016-11-03 NOTE — Progress Notes (Signed)
Patient HR increased to 150s after Amiodarone bolus and drip started. Dr. Ashby Dawes notified. Orders for cardizem '10mg'$  iv push and cardizem infusion.

## 2016-11-03 NOTE — Progress Notes (Signed)
Patient converted to NSR on ECG with possible ST elevation. Dr. Mortimer Fries notified. Orders for EKG placed.

## 2016-11-03 NOTE — Progress Notes (Signed)
Inpatient Diabetes Program Recommendations  AACE/ADA: New Consensus Statement on Inpatient Glycemic Control (2015)  Target Ranges:  Prepandial:   less than 140 mg/dL      Peak postprandial:   less than 180 mg/dL (1-2 hours)      Critically ill patients:  140 - 180 mg/dL   Results for Christopher Huber, Christopher Huber (MRN [id number]) as of 11/03/2016 08:44  Ref. Range 11/03/2016 07:32  Glucose-Capillary Latest Ref Range: 65 - 99 mg/dL 229 (H)    Admit with: Hypoxic Respiratory Failure/ Lung Cancer/ Pleural Effusion  Current Insulin Orders: None      MD- Please consider initiating ICU Glycemic Control Protocol Phase 1- Novolog Correction Scale/SSI Q4 hours      --Will follow patient during hospitalization--  Wyn Quaker RN, MSN, CDE Diabetes Coordinator Inpatient Glycemic Control Team Team Pager: [phone] (8a-5p)

## 2016-11-03 NOTE — Progress Notes (Signed)
CH was paged to be with wife while PT was being intubated. [place] relayed progress from nursing staff to wife before escorting back following. CH provided comfort and prayer for 1hr. CH available for follow-up as needed. Other family will come soon.

## 2016-11-03 NOTE — H&P (Addendum)
[facility] Consultation     ASSESSMENT/PLAN   70 yo male with recurrent lung cancer with acute respiratory failure, likely due to right airway obstruction with stridor. Also right pleural effusion.   PULMONARY A:Acute hypoxic resp failure.  Right pleural effusion. 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  RLL atelectasis due to above.  --Intubated due to stridor and increased work of breathing.  --Discussed with patient and wife, patient wishes to be full code and would like interventions to help his breathing.  P:   --Order IR Thoracentesis.  ----Discussed mechanical ventilation due to his severe dyspnea, he understands that intubation may be a terminal event.He was intubated due to stridor and wob.  --Discussed various options for palliation including thoracentesis, and airway stenting, he understands that he would have to be transferred to a tertiary center for stent.  --Discussed palliative thoracentesis, his pleural effusion is not large, but we will order a thoracentesis to see if this helps with the breathing. --May need bronchoscopy to evaluate airway for narrowing.   CARDIOVASCULAR A: Atrial flutter P:  May be contributing to dyspnea, but likely secondary to respiratory failure.  --Will start amiodarone as pt's BP is borderline.   RENAL A:  -- P:   --  GASTROINTESTINAL A:  Famotidine.   HEMATOLOGIC A:  Leukocytosis.   INFECTIOUS A:  Doubt infection.  P:   --Will check procalcitonin.  --Sputum culture.   Micro/culture results:  BCx2 -- UC -- Sputum--  Antibiotics:   ENDOCRINE A: Monitor blood sugars.   NEUROLOGIC A:  --   MAJOR EVENTS/TEST RESULTS:   Best Practices  DVT Prophylaxis: lovenox.  GI Prophylaxis: famotidine.    --------------------------------------- Advance care planning: Discussed code status and  wishes with patient had wife. Patient would like to do something that will make his breathing feel better, he understands that he has a terminal disease, but would like to seek continued options help him live longer and feel better. Wife understands that there may come a point where that will no longer be possible. We had a long discussion about stenting and several stents to get there, and that he will need to be evaluated at a tertiary center to see if he is a candidate.  We discussed thoracentesis which might help his symptoms.  We discussed intubation, which he favored because he felt that he could not go on breathing like he is now. They understand that if he is intubated that he may not never be extubated.  We decided that we will intubate, and try thoracentesis if possible and arrange for transfer to tertiary center.  30 min spent in discussion.  ---------------------------------------   Name: ONA RATHERT MRN: [id number] DOB: Feb 08, 1946    ADMISSION DATE:  11/01/2016  CHIEF COMPLAINT:  Dyspnea   HISTORY OF PRESENT ILLNESS:   Patient is a 70 yo with a history of with left upper lobe adenocarcinoma in 2014 and received 8 cycles of concurrent weekly carboplatinum and Taxol along with XRT between February 17, 2013 and Apr 07, 2013. He was recently diagnosed with recurrent bilateral adenoca, and opted to continued aggressive care.   The patient is on 3L oxygen at baseline, he presents with progressive dyspnea. Review of labs unremarkable other then leukocytosis. Review of imaging shows moderate right pleural effusion with some compressive atelectasis and left sided radiation changes. Right sided cancer with compression of the right  bronchus intermedius.   On speaking with the patient he has loud stridor, even audible outside of the room, and he is using his belly muscles to breathe even while on bipap. He and his wife note that he has been having progressive dyspnea over the past month, however  this really advanced over the past 2 days.  Wife notes that he is on 3L at home, and over the past week he was getting very winded with minor activity, his oxygen would drop into the 80's even with minor activity such as brushing his teeth.      PAST MEDICAL HISTORY :  Past Medical History:  Diagnosis Date  . Arthritis    right arm  . GERD (gastroesophageal reflux disease)   . Lung cancer ([place]) 2013   Rad tx's and chemo.    Past Surgical History:  Procedure Laterality Date  . COLONOSCOPY    . ESOPHAGOGASTRODUODENOSCOPY    . FLEXIBLE BRONCHOSCOPY N/A 09/03/2016   Procedure: FLEXIBLE BRONCHOSCOPY;  Surgeon: Wilhelmina Mcardle, MD;  Location: [facility] ORS;  Service: Pulmonary;  Laterality: N/A;  . Left shoulder reconstruction  ~1965   after hunting accident   Prior to Admission medications   Medication Sig Start Date End Date Taking? Authorizing Provider  albuterol (PROVENTIL HFA;VENTOLIN HFA) 108 (90 Base) MCG/ACT inhaler Inhale 2 puffs into the lungs every 6 (six) hours as needed for wheezing or shortness of breath. 06/11/16  Yes Venia Carbon, MD  Ascorbic Acid (VITAMIN C) 1000 MG tablet Take 1,000 mg by mouth daily.   Yes Historical Provider, MD  Cholecalciferol (VITAMIN D) 2000 UNITS CAPS Take 1 capsule by mouth daily.   Yes Historical Provider, MD  HYDROcodone-homatropine (HYCODAN) 5-1.5 MG/5ML syrup Take 5 mLs by mouth every 4 (four) hours as needed for cough. 10/28/16  Yes Lloyd Huger, MD  magnesium oxide (MAG-OX) 400 MG tablet Take 400 mg by mouth daily.   Yes Historical Provider, MD  megestrol (MEGACE) 40 MG tablet Take 1 tablet (40 mg total) by mouth daily. 09/30/16  Yes Lloyd Huger, MD  Multiple Vitamin (MULTIVITAMIN WITH MINERALS) TABS Take 1 tablet by mouth daily.   Yes Historical Provider, MD  omeprazole (PRILOSEC) 40 MG capsule Take 40 mg by mouth daily.  07/28/16  Yes Historical Provider, MD  prochlorperazine (COMPAZINE) 10 MG tablet Take 1 tablet (10 mg total)  by mouth every 6 (six) hours as needed for nausea or vomiting. 10/28/16  Yes Lloyd Huger, MD  tiotropium (SPIRIVA) 18 MCG inhalation capsule Place 1 capsule (18 mcg total) into inhaler and inhale daily. 09/04/16  Yes Laverle Hobby, MD  traMADol (ULTRAM) 50 MG tablet Take 50 mg by mouth every 6 (six) hours as needed.   Yes Historical Provider, MD  vitamin E 400 UNIT capsule Take 400 Units by mouth daily.   Yes Historical Provider, MD   Allergies  Allergen Reactions  . Pemetrexed Rash  . Codeine Nausea And Vomiting    Dizzy     FAMILY HISTORY:  Family History  Problem Relation Age of Onset  . Heart disease Father   . Diabetes Neg Hx   . Cancer Neg Hx    SOCIAL HISTORY:  reports that he quit smoking about 19 years ago. He has a 15.00 pack-year smoking history. He has never used smokeless tobacco. He reports that he does not drink alcohol or use drugs.  REVIEW OF SYSTEMS:   Could not obtain due to dyspnea.    VITAL SIGNS:  Temp:  [97.2 F (36.2 C)] 97.2 F (36.2 C) (11/26 2227) Pulse Rate:  [122-152] 122 (11/27 0030) Resp:  [10-27] 20 (11/27 0030) BP: (86-168)/(58-107) 86/58 (11/27 0030) SpO2:  [86 %-100 %] 99 % (11/27 0030) Weight:  [71.7 kg (158 lb)] 71.7 kg (158 lb) (11/26 2243) HEMODYNAMICS:   VENTILATOR SETTINGS:   INTAKE / OUTPUT: No intake or output data in the 24 hours ending 11/03/16 0132  Physical Examination:   VS: BP (!) 86/58   Pulse (!) 122   Temp 97.2 F (36.2 C)   Resp 20   Ht '5\' 9"'$  (1.753 m)   Wt 71.7 kg (158 lb)   SpO2 99%   BMI 23.33 kg/m   General Appearance: No distress  Neuro:without focal findings, mental status normal.  HEENT: PERRLA, EOM intact, no ptosis. Pulmonary: Decreased air entry in RLL, diaphragmatic excursion normal. CardiovascularNormal S1,S2.  No m/r/g.    Abdomen: Benign, Soft, non-tender, No masses, hepatosplenomegaly, No lymphadenopathy Renal:  No costovertebral tenderness  GU:  Not performed at this  time. Endoc: No evident thyromegaly, no signs of acromegaly. Skin:   warm, no rashes, no ecchymosis  Extremities: normal, no cyanosis, clubbing, no edema, warm with normal capillary refill.    LABS: Reviewed   LABORATORY PANEL:   CBC  Recent Labs Lab 10/21/2016 2215  WBC 14.8*  HGB 12.4*  HCT 37.5*  PLT 575*    Chemistries   Recent Labs Lab 10/28/16 0921 11/06/2016 2215  NA 137 136  K 3.6 3.9  CL 101 103  CO2 26 21*  GLUCOSE 104* 180*  BUN 12 14  CREATININE 0.93 1.15  CALCIUM 9.2 9.3  AST 18  --   ALT 11*  --   ALKPHOS 74  --   BILITOT 0.5  --     No results for input(s): GLUCAP in the last 168 hours. No results for input(s): PHART, PCO2ART, PO2ART in the last 168 hours.  Recent Labs Lab 10/28/16 0921  AST 18  ALT 11*  ALKPHOS 74  BILITOT 0.5  ALBUMIN 3.4*    Cardiac Enzymes  Recent Labs Lab 10/23/2016 2215  TROPONINI <0.03    RADIOLOGY:  Ct Angio Chest Pe W And/or Wo Contrast  Result Date: 11/03/2016 CLINICAL DATA:  Difficulty breathing and shortness of breath. History of right lung cancer. EXAM: CT ANGIOGRAPHY CHEST WITH CONTRAST TECHNIQUE: Multidetector CT imaging of the chest was performed using the standard protocol during bolus administration of intravenous contrast. Multiplanar CT image reconstructions and MIPs were obtained to evaluate the vascular anatomy. CONTRAST:  75 mL Isovue 370 COMPARISON:  05/07/2016 FINDINGS: Cardiovascular: Good opacification of the central and segmental pulmonary arteries. No focal filling defects. No evidence of significant pulmonary embolus. Normal heart size. Moderate-sized pericardial effusion. Normal caliber thoracic aorta with scattered calcifications. Mediastinum/Nodes: Esophagus is decompressed. Probable lymphadenopathy in the right posterior hilum causing extrinsic narrowing of the bronchus intermedius. This is progressing since previous study. Lungs/Pleura: There is collapse and consolidation of the right  upper lung with abrupt termination of the right upper lung bronchus suggesting an obstructing mass or endobronchial lesion. Areas of linear atelectasis demonstrated in the right middle lung. Calcified right pleural plaque. Left upper lung is collapsed with air bronchograms. Can't exclude an endobronchial lesion. Hyperinflation of left lower lobe. Large right pleural effusion. No left pleural effusion. No pneumothorax. Upper Abdomen: No acute abnormality. Musculoskeletal: No chest wall abnormality. No acute or significant osseous findings. Review of the MIP images confirms the above findings.  IMPRESSION: 1. Collapse and consolidation of the right upper lung with abrupt termination of right upper lung bronchus suggesting an obstructing mass or endobronchial lesion. Right hilar lymphadenopathy causing extrinsic compression of the right bronchus intermedius. Large right pleural effusion. 2. Collapse and consolidation of the left upper lung with compensatory hyperinflation of the left lower lung. Can't exclude an endobronchial lesion urethra. 3. Pericardial effusion. No evidence of significant pulmonary embolus. Electronically Signed   By: Lucienne Capers M.D.   On: 11/03/2016 00:13   Dg Chest Port 1 View  Result Date: 11/01/2016 CLINICAL DATA:  70 y/o M; respiratory distress and extreme shortness of breath. History of lung cancer. EXAM: PORTABLE CHEST 1 VIEW COMPARISON:  09/04/2016 chest radiograph. FINDINGS: Volume loss in the right lung and consolidation of the right lung apex has progressed from prior radiographs which may represent a combination of posttreatment changes, progressive malignancy, and/or pulmonary consolidation. Stable cardiac silhouette given projection and technique. Left mid lung zone linear scarring is unchanged. Blunted right costal diaphragmatic angle may represent a small effusion. Right port catheter tip projects over lower SVC. Bones are unremarkable. IMPRESSION: Volume loss in the right  lung and consolidation of the right lung apex has progressed from prior radiographs which may represent a combination of posttreatment changes, progressive malignancy, and/or pulmonary consolidation. Electronically Signed   By: Kristine Garbe M.D.   On: 10/20/2016 22:56       --Marda Stalker, MD.  Board Certified in Internal Medicine, Pulmonary Medicine, [place], and Sleep Medicine.  ICU Pager [phone] [facility] Office Number: [phone]  Patricia Pesa, M.D.  Vilinda Boehringer, M.D.  Merton Border, M.D   11/03/2016, 1:32 AM  Critical Care Attestation.  I have personally obtained a history, examined the patient, evaluated laboratory and imaging results, formulated the assessment and plan and placed orders. The Patient requires high complexity decision making for assessment and support, frequent evaluation and titration of therapies, application of advanced monitoring technologies and extensive interpretation of multiple databases. The patient has critical illness that could lead imminently to failure of 1 or more organ systems and requires the highest level of physician preparedness to intervene.  Critical Care Time devoted to patient care services described in this note is 45 minutes and is exclusive of time spent in procedures.

## 2016-11-03 NOTE — Consult Note (Signed)
[place]  [phone] [phone]  ID: Heloise Purpura OB: 05-Apr-1946  MR#: [id number]  XBM#:[id number]  Patient Care Team: Venia Carbon, MD as PCP - General (Internal Medicine)  CHIEF COMPLAINT: Stage IV lung cancer, respiratory distress.  INTERVAL HISTORY: Patient is a 70 year old male who is undergoing treatment for recurrent bilateral adenocarcinoma the lung with immunotherapy. His last treatment was given on October 28, 2016. He recently presented to the emergency room with worsening shortness of breath, stridor, and respiratory distress. Patient was subsequently intubated and sedated. Review of systems is unobtainable, but family is at bedside.  REVIEW OF SYSTEMS:   Review of Systems  Unable to perform ROS: Intubated    PAST MEDICAL HISTORY: Past Medical History:  Diagnosis Date  . Arthritis    right arm  . GERD (gastroesophageal reflux disease)   . Lung cancer ([place]) 2013   Rad tx's and chemo.     PAST SURGICAL HISTORY: Past Surgical History:  Procedure Laterality Date  . COLONOSCOPY    . ESOPHAGOGASTRODUODENOSCOPY    . FLEXIBLE BRONCHOSCOPY N/A 09/03/2016   Procedure: FLEXIBLE BRONCHOSCOPY;  Surgeon: Wilhelmina Mcardle, MD;  Location: [facility] ORS;  Service: Pulmonary;  Laterality: N/A;  . Left shoulder reconstruction  ~1965   after hunting accident    FAMILY HISTORY: Family History  Problem Relation Age of Onset  . Heart disease Father   . Diabetes Neg Hx   . Cancer Neg Hx     ADVANCED DIRECTIVES (Y/N):  @ADVDIR @  HEALTH MAINTENANCE: Social History  Substance Use Topics  . Smoking status: Former Smoker    Packs/day: 1.00    Years: 15.00    Quit date: 06/27/1997  . Smokeless tobacco: Never Used  . Alcohol use No     Colonoscopy:  PAP:  Bone density:  Lipid panel:  Allergies  Allergen Reactions  . Pemetrexed Rash  . Codeine Nausea And Vomiting    Dizzy     Current Facility-Administered Medications    Medication Dose Route Frequency Provider Last Rate Last Dose  . 0.9 %  sodium chloride infusion  250 mL Intravenous PRN Laverle Hobby, MD      . amiodarone (NEXTERONE PREMIX) 360-4.14 MG/200ML-% (1.8 mg/mL) IV infusion  30 mg/hr Intravenous Continuous Laverle Hobby, MD   Stopped at 11/03/16 1630  . budesonide (PULMICORT) nebulizer solution 0.5 mg  0.5 mg Nebulization BID Flora Lipps, MD   0.5 mg at 11/03/16 1942  . ceFEPIme (MAXIPIME) 2 GM / 108m IVPB premix  2 g Intravenous Q8H PLaverle Hobby MD   2 g at 11/03/16 1505  . chlorhexidine gluconate (MEDLINE KIT) (PERIDEX) 0.12 % solution 15 mL  15 mL Mouth Rinse BID PLaverle Hobby MD   15 mL at 11/03/16 2000  . diltiazem (CARDIZEM) 100 mg in dextrose 5 % 100 mL (1 mg/mL) infusion  5-15 mg/hr Intravenous Titrated PLaverle Hobby MD   Stopped at 11/03/16 1217  . enoxaparin (LOVENOX) injection 40 mg  40 mg Subcutaneous Q24H PLaverle Hobby MD   40 mg at 11/03/16 0355  . famotidine (PEPCID) IVPB 20 mg premix  20 mg Intravenous Q12H HMerilyn Baba RPH   20 mg at 11/03/16 1217  . feeding supplement (PRO-STAT SUGAR FREE 64) liquid 30 mL  30 mL Per Tube Daily KFlora Lipps MD   Stopped at 11/03/16 1345  . [START ON 11/04/2016] feeding supplement (VITAL HIGH PROTEIN) liquid 1,000 mL  1,000 mL Per Tube Q24H KFlora Lipps MD      .  fentaNYL (SUBLIMAZE) bolus via infusion 25 mcg  25 mcg Intravenous Q1H PRN Laverle Hobby, MD   25 mcg at 11/03/16 1710  . fentaNYL (SUBLIMAZE) injection 50 mcg  50 mcg Intravenous Once Laverle Hobby, MD      . fentaNYL 2513mg in NS 25108m(1050mml) infusion-PREMIX  25-400 mcg/hr Intravenous Continuous PraLaverle HobbyD 25 mL/hr at 11/03/16 1900 250 mcg/hr at 11/03/16 1900  . insulin aspart (novoLOG) injection 2-6 Units  2-6 Units Subcutaneous Q4H KurFlora LippsD   4 Units at 11/03/16 2047  . iopamidol (ISOVUE-300) 61 % injection 15 mL  15 mL Oral Q1 Hr x 2 PraLaverle HobbyD      . ipratropium-albuterol (DUONEB) 0.5-2.5 (3) MG/3ML nebulizer solution 3 mL  3 mL Nebulization Q4H KurFlora LippsD   3 mL at 11/03/16 1942  . MEDLINE mouth rinse  15 mL Mouth Rinse 10 times per day PraLaverle HobbyD   15 mL at 11/03/16 1733  . methylPREDNISolone sodium succinate (SOLU-MEDROL) 125 mg/2 mL injection 60 mg  60 mg Intravenous Q12H KurFlora LippsD   60 mg at 11/03/16 1216  . midazolam (VERSED) injection 1 mg  1 mg Intravenous Q15 min PRN PraLaverle HobbyD      . midazolam (VERSED) injection 1 mg  1 mg Intravenous Q2H PRN PraLaverle HobbyD   1 mg at 11/03/16 0529  . phenylephrine (NEO-SYNEPHRINE) 10 mg in dextrose 5 % 250 mL (0.04 mg/mL) infusion  0-400 mcg/min Intravenous Titrated PraLaverle HobbyD 60 mL/hr at 11/03/16 2117 40 mcg/min at 11/03/16 2117  . sennosides (SENOKOT) 8.8 MG/5ML syrup 5 mL  5 mL Per Tube BID PRN PraLaverle HobbyD       Facility-Administered Medications Ordered in Other Encounters  Medication Dose Route Frequency Provider Last Rate Last Dose  . sodium chloride flush (NS) 0.9 % injection 10 mL  10 mL Intravenous PRN TimLloyd HugerD   10 mL at 05/26/16 1056    OBJECTIVE: Vitals:   11/03/16 1800 11/03/16 1900  BP: 113/72 93/64  Pulse: 80 76  Resp: 14 14  Temp:       Body mass index is 23.51 kg/m.    ECOG FS:4 - Bedbound  General: Intubated, sedated. HEENT: ET tube in place. Lungs: Clear to auscultation bilaterally. Heart: Regular rate and rhythm. No rubs, murmurs, or gallops. Abdomen: Soft, nontender, nondistended. No organomegaly noted, normoactive bowel sounds. Musculoskeletal: No edema, cyanosis, or clubbing. Neuro: Intubated and sedated. Skin: No rashes or petechiae noted.   LAB RESULTS:  Lab Results  Component Value Date   NA 136 11/03/2016   K 3.9 11/03/2016   CL 105 11/03/2016   CO2 22 11/03/2016   GLUCOSE 189 (H) 11/03/2016   BUN 14 11/03/2016   CREATININE 1.00  11/03/2016   CALCIUM 8.9 11/03/2016   PROT 6.7 10/28/2016   ALBUMIN 3.4 (L) 10/28/2016   AST 18 10/28/2016   ALT 11 (L) 10/28/2016   ALKPHOS 74 10/28/2016   BILITOT 0.5 10/28/2016   GFRNONAA >60 11/03/2016   GFRAA >60 11/03/2016    Lab Results  Component Value Date   WBC 7.0 11/03/2016   NEUTROABS 9.1 (H) 11/04/2016   HGB 11.8 (L) 11/03/2016   HCT 34.9 (L) 11/03/2016   MCV 80.2 11/03/2016   PLT 366 11/03/2016     STUDIES: Dg Chest 1 View  Result Date: 11/03/2016 CLINICAL DATA:  Followup thoracentesis EXAM: CHEST 1 VIEW COMPARISON:  Earlier same day FINDINGS: Endotracheal tube  tip is 4 cm above the carina. Nasogastric tube enters the abdomen. Power port unchanged with its tip at the SVC RA junction. Right upper lobe collapse persists. No pneumothorax on the right. Volume loss in the left upper lobe as seen previously is slightly improved. IMPRESSION: No pneumothorax post right thoracentesis. Persistent right upper lobe collapse. Better aeration in the left upper lobe. Electronically Signed   By: Nelson Chimes M.D.   On: 11/03/2016 18:04   Dg Chest 1 View  Result Date: 11/03/2016 CLINICAL DATA:  Endotracheal tube placement EXAM: CHEST 1 VIEW COMPARISON:  10/18/2016 FINDINGS: An endotracheal tube was placed with tip measuring 4.1 cm above the carina. Enteric tube tip is localized in the left upper quadrant consistent with location in the upper stomach. Proximal side hole is near the location of the EG junction. Right central venous catheter with tip over the low SVC region. No pneumothorax. Right upper lobe collapse and consolidation with less prominent left upper lobe collapse and consolidation, progressing since previous study. Linear scarring in the left mid lung. Small right pleural effusion. Normal heart size. IMPRESSION: Appliances positioned as described with enteric tube tip in the left upper quadrant but proximal side hole near the EG junction. Endotracheal tube is in  satisfactory position. Progressive bilateral upper lobe collapse and consolidation. Electronically Signed   By: Lucienne Capers M.D.   On: 11/03/2016 04:04   Dg Abd 1 View  Result Date: 11/03/2016 CLINICAL DATA:  NG placement EXAM: ABDOMEN - 1 VIEW COMPARISON:  None. FINDINGS: Enteric tube is present with tip in the left upper quadrant consistent with location in the upper stomach. Proximal side hole is projected over the expected location of the EG junction and may be in the lower esophagus. Consider advancement. Elevation of the right hemidiaphragm with small right pleural effusion. Residual contrast material in the urinary tract. IMPRESSION: Enteric tube tip is in the left upper quadrant the proximal side hole projects over the expected location of the EG junction, possibly in the lower esophagus. Consider advancing the tube for better placement. Electronically Signed   By: Lucienne Capers M.D.   On: 11/03/2016 04:05   Ct Angio Chest Pe W And/or Wo Contrast  Result Date: 11/03/2016 CLINICAL DATA:  Difficulty breathing and shortness of breath. History of right lung cancer. EXAM: CT ANGIOGRAPHY CHEST WITH CONTRAST TECHNIQUE: Multidetector CT imaging of the chest was performed using the standard protocol during bolus administration of intravenous contrast. Multiplanar CT image reconstructions and MIPs were obtained to evaluate the vascular anatomy. CONTRAST:  75 mL Isovue 370 COMPARISON:  05/07/2016 FINDINGS: Cardiovascular: Good opacification of the central and segmental pulmonary arteries. No focal filling defects. No evidence of significant pulmonary embolus. Normal heart size. Moderate-sized pericardial effusion. Normal caliber thoracic aorta with scattered calcifications. Mediastinum/Nodes: Esophagus is decompressed. Probable lymphadenopathy in the right posterior hilum causing extrinsic narrowing of the bronchus intermedius. This is progressing since previous study. Lungs/Pleura: There is collapse  and consolidation of the right upper lung with abrupt termination of the right upper lung bronchus suggesting an obstructing mass or endobronchial lesion. Areas of linear atelectasis demonstrated in the right middle lung. Calcified right pleural plaque. Left upper lung is collapsed with air bronchograms. Can't exclude an endobronchial lesion. Hyperinflation of left lower lobe. Large right pleural effusion. No left pleural effusion. No pneumothorax. Upper Abdomen: No acute abnormality. Musculoskeletal: No chest wall abnormality. No acute or significant osseous findings. Review of the MIP images confirms the above findings. IMPRESSION: 1. Collapse  and consolidation of the right upper lung with abrupt termination of right upper lung bronchus suggesting an obstructing mass or endobronchial lesion. Right hilar lymphadenopathy causing extrinsic compression of the right bronchus intermedius. Large right pleural effusion. 2. Collapse and consolidation of the left upper lung with compensatory hyperinflation of the left lower lung. Can't exclude an endobronchial lesion urethra. 3. Pericardial effusion. No evidence of significant pulmonary embolus. Electronically Signed   By: Lucienne Capers M.D.   On: 11/03/2016 00:13   Dg Chest Port 1 View  Result Date: 10/10/2016 CLINICAL DATA:  70 y/o M; respiratory distress and extreme shortness of breath. History of lung cancer. EXAM: PORTABLE CHEST 1 VIEW COMPARISON:  09/04/2016 chest radiograph. FINDINGS: Volume loss in the right lung and consolidation of the right lung apex has progressed from prior radiographs which may represent a combination of posttreatment changes, progressive malignancy, and/or pulmonary consolidation. Stable cardiac silhouette given projection and technique. Left mid lung zone linear scarring is unchanged. Blunted right costal diaphragmatic angle may represent a small effusion. Right port catheter tip projects over lower SVC. Bones are unremarkable.  IMPRESSION: Volume loss in the right lung and consolidation of the right lung apex has progressed from prior radiographs which may represent a combination of posttreatment changes, progressive malignancy, and/or pulmonary consolidation. Electronically Signed   By: Kristine Garbe M.D.   On: 10/29/2016 22:56   Dg Abd Portable 1v  Result Date: 11/03/2016 CLINICAL DATA:  70 year old male with nasogastric tube placement. Subsequent encounter. EXAM: PORTABLE ABDOMEN - 1 VIEW COMPARISON:  11/03/2016 10:52 a.m. FINDINGS: Nasogastric tube has been advanced. The tip is at the level of the body/antrum junction. Bowel gas pattern unremarkable. Residual contrast within the bladder from recent chest CT. Foley catheter appears in place. IMPRESSION: Nasogastric tube has been advanced. The tip is at the level of the body/antrum junction level. Side hole fundus-body junction level. Electronically Signed   By: Genia Del M.D.   On: 11/03/2016 12:09   Dg Abd Portable 1v  Result Date: 11/03/2016 CLINICAL DATA:  NG tube placement. History of lung malignancy and gastroesophageal reflux and respiratory failure. EXAM: PORTABLE ABDOMEN - 1 VIEW COMPARISON:  KUB of November 03, 2016 upper earlier today. FINDINGS: The NG tube has been advanced such that the proximal port now all lies just below the GE junction and the tip in the mid gastric body. The bowel gas pattern is unremarkable. There is increased density at the right lung base which is stable. IMPRESSION: Interval advancement of the nasogastric tube by approximately 5 DIS 7 cm hest both the proximal port and the tip in the gastric body. Advancement by an additional 5 cm would be useful to assure that the proximal port does remain below the GE junction. Electronically Signed   By: David  Martinique M.D.   On: 11/03/2016 11:09    ASSESSMENT: Stage IV lung cancer, respiratory distress.  PLAN:    1. Recurrent bilateral adenocarcinoma the lung: Patient received  cycle 4 of 6 of nivolumab on October 28, 2016. CT scan results reviewed independently and reported as above likely progressive malignancy as a possible etiology of his respiratory distress. Although patient's next treatment is scheduled for 1 week, it is unlikely any further treatments will be possible. After lengthy discussion with the wife, patient was previously made DO NOT RESUSCITATE and she expressed understanding that he is at high risk for death. She understands that if he is extubated, he likely would not survive. Despite this, she wishes to  pursue CT of the head as well as abdomen and pelvis to ensure she has a complete picture of his condition. These have been ordered. Once resulted, patient's wife will make the decision of whether or not to proceed with extubation and comfort care.  Appreciate consult, will follow.   Recurrent non-small cell lung cancer ([place])   Staging form: Lung, AJCC 7th Edition   - Clinical stage from 06/28/2015: Stage IV (T3, N2, M1a) - Signed by Lloyd Huger, MD on 09/14/2016  Lloyd Huger, MD   11/03/2016 9:41 PM

## 2016-11-03 NOTE — Care Management (Signed)
Message left with patient's wife (Ms. Yolanda Bonine) [phone] to explain RNCM role and to assess patient's baseline. RNCM will continue to follow.

## 2016-11-03 NOTE — Progress Notes (Signed)
Pharmacy Antibiotic Note  Christopher Huber is a 70 y.o. male admitted on 11/04/2016 with respiratory failure and post obstructive pneumonia. Pharmacy has been consulted for cefepime dosing.  Plan: Begin cefepime 2 g IV q 8 hours  Height: '5\' 9"'$  (175.3 cm) Weight: 159 lb 2.8 oz (72.2 kg) IBW/kg (Calculated) : 70.7  Temp (24hrs), Avg:97.8 F (36.6 C), Min:97.2 F (36.2 C), Max:98.1 F (36.7 C)    Recent Labs Lab 10/28/16 0921 10/15/2016 2215 11/03/16 0413  WBC 8.9 14.8* 7.0  CREATININE 0.93 1.15 1.00    Estimated Creatinine Clearance: 66.8 mL/min (by C-G formula based on SCr of 1 mg/dL).    Allergies  Allergen Reactions  . Pemetrexed Rash  . Codeine Nausea And Vomiting    Dizzy     Antimicrobials this admission: Cefepime 11/27 >>   Dose adjustments this admission:  Microbiology results:  BCx:  11/27 UCx: pending  11/27 Sputum: pending  11/27 MRSA PCR: negative  Thank you for allowing pharmacy to be a part of this patient's care.  Darrow Bussing, PharmD Pharmacy Resident 11/03/2016 12:01 PM

## 2016-11-03 NOTE — Procedures (Signed)
Endotracheal Intubation: Patient required placement of an artificial airway secondary to resp failure.   Consent: Emergent.   Hand washing performed prior to starting the procedure.   Medications administered for sedation prior to procedure: Midazolam 4 mg IV,  Rocurionium mg IV, Fentanyl 100 mcg IV.   Procedure: A time out procedure was called and correct patient, name, & ID confirmed. Needed supplies and equipment were assembled and checked to include ETT, 10 ml syringe, Glidescope, Mac and Miller blades, suction, oxygen and bag mask valve, end tidal CO2 monitor. Patient was positioned to align the mouth and pharynx to facilitate visualization of the glottis.  Heart rate, SpO2 and blood pressure was continuously monitored during the procedure. Pre-oxygenation was conducted prior to intubation and endotracheal tube was placed through the vocal cords into the trachea.     The artificial airway was placed under direct visualization via glidescope route using a 8 ETT on the first attempt.    ETT was secured at 24 cm mark.    Placement was confirmed by auscuitation of lungs with good breath sounds bilaterally and no stomach sounds.  Condensation was noted on endotracheal tube.  Pulse ox %.  CO2 detector in place with appropriate color change.   Complications: None .    Chest radiograph ordered and pending.     Marda Stalker, M.D. 11/03/2016

## 2016-11-04 ENCOUNTER — Inpatient Hospital Stay: Payer: Medicare Other

## 2016-11-04 ENCOUNTER — Encounter: Payer: Self-pay | Admitting: Radiology

## 2016-11-04 DIAGNOSIS — J9601 Acute respiratory failure with hypoxia: Secondary | ICD-10-CM

## 2016-11-04 LAB — BLOOD GAS, VENOUS
ACID-BASE DEFICIT: 0.2 mmol/L (ref 0.0–2.0)
BICARBONATE: 27.1 mmol/L (ref 20.0–28.0)
DELIVERY SYSTEMS: POSITIVE
FIO2: 0.4
PCO2 VEN: 55 mmHg (ref 44.0–60.0)
PH VEN: 7.3 (ref 7.250–7.430)
Patient temperature: 37

## 2016-11-04 LAB — PROCALCITONIN: Procalcitonin: 0.14 ng/mL

## 2016-11-04 LAB — GLUCOSE, CAPILLARY
GLUCOSE-CAPILLARY: 147 mg/dL — AB (ref 65–99)
GLUCOSE-CAPILLARY: 165 mg/dL — AB (ref 65–99)
GLUCOSE-CAPILLARY: 158 mg/dL — AB (ref 65–99)
GLUCOSE-CAPILLARY: 167 mg/dL — AB (ref 65–99)
Glucose-Capillary: 160 mg/dL — ABNORMAL HIGH (ref 65–99)
Glucose-Capillary: 170 mg/dL — ABNORMAL HIGH (ref 65–99)
Glucose-Capillary: 181 mg/dL — ABNORMAL HIGH (ref 65–99)

## 2016-11-04 LAB — BASIC METABOLIC PANEL
Anion gap: 7 (ref 5–15)
BUN: 22 mg/dL — AB (ref 6–20)
CALCIUM: 8.9 mg/dL (ref 8.9–10.3)
CO2: 23 mmol/L (ref 22–32)
Chloride: 100 mmol/L — ABNORMAL LOW (ref 101–111)
Creatinine, Ser: 0.97 mg/dL (ref 0.61–1.24)
GFR calc Af Amer: >60 mL/min (ref >60)
GLUCOSE: 171 mg/dL — AB (ref 65–99)
POTASSIUM: 3.9 mmol/L (ref 3.5–5.1)
Sodium: 130 mmol/L — ABNORMAL LOW (ref 135–145)

## 2016-11-04 LAB — PHOSPHORUS: PHOSPHORUS: 2.5 mg/dL (ref 2.5–4.6)

## 2016-11-04 LAB — URINE CULTURE: CULTURE: NO GROWTH

## 2016-11-04 LAB — MAGNESIUM
MAGNESIUM: 1.8 mg/dL (ref 1.7–2.4)
Magnesium: 1.8 mg/dL (ref 1.7–2.4)

## 2016-11-04 MED ORDER — ALPRAZOLAM 1 MG PO TABS
1.0000 mg | ORAL_TABLET | Freq: Three times a day (TID) | ORAL | Status: DC
  Administered 2016-11-04 – 2016-11-07 (×10): 1 mg via ORAL
  Filled 2016-11-04 (×10): qty 1

## 2016-11-04 MED ORDER — IOPAMIDOL (ISOVUE-300) INJECTION 61%
100.0000 mL | Freq: Once | INTRAVENOUS | Status: AC | PRN
  Administered 2016-11-04: 100 mL via INTRAVENOUS

## 2016-11-04 NOTE — Progress Notes (Signed)
Notified E Link RN about peaked T waves (T wave had been increasing throughout afternoon). No potassium value from this morning (MD had not ordered labs nor wanted to in rounds today). Currently no ectopy, but also no labs ordered for tomorrow morning. E Link RN will notify Margaree Mackintosh MD. Team will continue to monitor, patient DNR.

## 2016-11-04 NOTE — Plan of Care (Signed)
Problem: Pain Managment: Goal: General experience of comfort will improve Outcome: Not Progressing Patient did not have SBT today due to agitation during wake up assessment. Patient went from still drowsy and not following commands to wide awake and very agitated (RASS 3) but still not following commands. Would not follow commands at all during shift, also would open eyes to voice or spontaneously depending on part of shift.   Problem: Physical Regulation: Goal: Ability to maintain clinical measurements within normal limits will improve Outcome: Progressing Neosynephrine titrated during shift. Currently off since 1745.

## 2016-11-04 NOTE — Progress Notes (Signed)
[facility] Progress Note Patient Name: Christopher Huber DOB: [birth year] MRN: [id number]   Date of Service  11/04/2016  HPI/Events of Note  Rn notes increasing T waves.   eICU Interventions  Will check electrolytes, will continue to monitor, though per notes pt may be comfort measures in near future.      Intervention Category Intermediate Interventions: Electrolyte abnormality - evaluation and management;Arrhythmia - evaluation and management  Laverle Hobby 11/04/2016, 6:17 PM

## 2016-11-04 NOTE — Progress Notes (Signed)
Chaplain was making his rounds and visited with pt in room IC-17. Provided the ministry of prayer and a pastoral presence.    11/03/16 1315  Clinical Encounter Type  Visited With Patient  Visit Type Initial;Spiritual support  Referral From [place]

## 2016-11-04 NOTE — Progress Notes (Signed)
Pharmacy Antibiotic Note  Christopher Huber is a 70 y.o. male admitted on 10/12/2016 with respiratory failure and post obstructive pneumonia. Pharmacy has been consulted for cefepime dosing. Pt has stage IV lung cancer and is currently intubated.  Plan: Continue cefepime 2 g IV q 8 hours  Height: '5\' 9"'$  (175.3 cm) Weight: 168 lb 6.9 oz (76.4 kg) IBW/kg (Calculated) : 70.7  Temp (24hrs), Avg:98.1 F (36.7 C), Min:97.6 F (36.4 C), Max:98.5 F (36.9 C)    Recent Labs Lab 10/30/2016 2215 11/03/16 0413  WBC 14.8* 7.0  CREATININE 1.15 1.00    Estimated Creatinine Clearance: 66.8 mL/min (by C-G formula based on SCr of 1 mg/dL).    Allergies  Allergen Reactions  . Pemetrexed Rash  . Codeine Nausea And Vomiting    Dizzy     Antimicrobials this admission: Cefepime 11/27 >>   Dose adjustments this admission:  Microbiology results: 11/27 Pleural fluid Cx: No growth < 24 hours  11/27 UCx: NG 11/27 Sputum: pending  11/27 MRSA PCR: negative  Thank you for allowing pharmacy to be a part of this patient's care.  Darrow Bussing, PharmD Pharmacy Resident 11/04/2016 3:18 PM

## 2016-11-04 NOTE — Progress Notes (Signed)
Chaplain was making his rounds and visited with pt in room IC-18. Pt requested some water which I assisted them with. Provided the ministry of prayer and a spiritual presence.    11/03/16 1335  Clinical Encounter Type  Visited With Patient  Visit Type Initial  Referral From Nurse  Spiritual Encounters  Spiritual Needs Prayer

## 2016-11-04 NOTE — H&P (Signed)
[facility] Consultation     ASSESSMENT/PLAN   70 yo male with recurrent lung cancer with acute respiratory failure, likely due to right airway obstruction with stridor. Also right pleural effusion.   Not a candidate for stent placement  PULMONARY A:Acute hypoxic resp failure.  Right pleural effusion. 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  RLL atelectasis due to above.  --Intubated due to stridor and increased work of breathing.  -start steroids, start IV abx   IR Thoracentesis removed 1 l amber fluid cytology pending   CARDIOVASCULAR A: Atrial flutter P:  May be contributing to dyspnea, but likely secondary to respiratory failure.  --Will start amiodarone as pt's BP is borderline.    GASTROINTESTINAL A:  Famotidine.   HEMATOLOGIC A:  Leukocytosis.   INFECTIOUS Post obstructive pneumonitis Start ABX   Micro/culture results:  BCx2 -- UC -- Sputum--  Antibiotics:cefepime 11/27   ENDOCRINE A: Monitor blood sugars.   NEUROLOGIC - intubated and sedated - minimal sedation to achieve a RASS goal: -1    MAJOR EVENTS/TEST RESULTS:   Best Practices  DVT Prophylaxis: lovenox.  GI Prophylaxis: famotidine.    --------------------------------------- Advance care planning: Discussed code status and wishes with patient had wife. patient now DNR  We discussed thoracentesis which might help his symptoms.  We discussed intubation, which he favored because he felt that he could not go on breathing like he is now. - They understand that if he is intubated that he may not never be extubated.  30 min spent in discussion.  ---------------------------------------   Name: DEMONTE DOBRATZ MRN: [id number] DOB: [birth year]    ADMISSION DATE:  10/27/2016  CHIEF COMPLAINT:  Dyspnea   HISTORY OF PRESENT ILLNESS:   Patient is a 70 yo with a  history of with left upper lobe adenocarcinoma in 2014 and received 8 cycles of concurrent weekly carboplatinum and Taxol along with XRT between February 17, 2013 and Apr 07, 2013. He was recently diagnosed with recurrent bilateral adenoca, and opted to continued aggressive care.   The patient is on 3L oxygen at baseline, he presents with progressive dyspnea. Review of labs unremarkable other then leukocytosis. Review of imaging shows moderate right pleural effusion with some compressive atelectasis and left sided radiation changes. Right sided cancer with compression of the right bronchus intermedius.   On speaking with the patient he has loud stridor, even audible outside of the room, and he is using his belly muscles to breathe even while on bipap. He and his wife note that he has been having progressive dyspnea over the past month, however this really advanced over the past 2 days.  Wife notes that he is on 3L at home, and over the past week he was getting very winded with minor activity, his oxygen would drop into the 80's even with minor activity such as brushing his teeth.   SUBJECTIVE Remains intubated,sedated On full vent  Support Await Ct head abd pelvis   REVIEW OF SYSTEMS:   Could not obtain due to dyspnea.    VITAL SIGNS: Temp:  [97.6 F (36.4 C)-98.5 F (36.9 C)] 98.5 F (36.9 C) (11/28 0820) Pulse Rate:  [73-113] 89 (11/28 0600) Resp:  [12-16] 12 (11/28 0600) BP: (76-113)/(62-76) 91/68 (11/28 0600) SpO2:  [95 %-100 %] 97 % (11/28 0600) FiO2 (%):  [28 %-30 %] 28 % (11/28 0400) Weight:  [168 lb 6.9 oz (76.4  kg)] 168 lb 6.9 oz (76.4 kg) (11/28 0418) HEMODYNAMICS:   VENTILATOR SETTINGS: Vent Mode: PRVC FiO2 (%):  [28 %-30 %] 28 % Set Rate:  [14 bmp] 14 bmp Vt Set:  [570 mL] 570 mL PEEP:  [5 cmH20] 5 cmH20 INTAKE / OUTPUT:  Intake/Output Summary (Last 24 hours) at 11/04/16 0836 Last data filed at 11/04/16 0618  Gross per 24 hour  Intake          3026.52 ml  Output               610 ml  Net          2416.52 ml    Physical Examination:   VS: BP 91/68   Pulse 89   Temp 98.5 F (36.9 C) (Oral)   Resp 12   Ht '5\' 9"'$  (1.753 m)   Wt 168 lb 6.9 oz (76.4 kg)   SpO2 97%   BMI 24.87 kg/m   General Appearance: +distress, on vent Neuro:GCS<8T HEENT: PERRLA, EOM intact, no ptosis. Pulmonary: Decreased air entry in RLL, diaphragmatic excursion normal. CardiovascularNormal S1,S2.  No m/r/g.    Abdomen: Benign, Soft, non-tender, No masses, hepatosplenomegaly, No lymphadenopathy Renal:  No costovertebral tenderness  GU:  Not performed at this time. Endoc: No evident thyromegaly, no signs of acromegaly. Skin:   warm, no rashes, no ecchymosis  Extremities: normal, no cyanosis, clubbing, no edema, warm with normal capillary refill.    LABS: Reviewed   LABORATORY PANEL:   CBC  Recent Labs Lab 11/03/16 0413  WBC 7.0  HGB 11.8*  HCT 34.9*  PLT 366    Chemistries   Recent Labs Lab 10/28/16 0921  11/03/16 0413 11/04/16 0513  NA 137  < > 136  --   K 3.6  < > 3.9  --   CL 101  < > 105  --   CO2 26  < > 22  --   GLUCOSE 104*  < > 189*  --   BUN 12  < > 14  --   CREATININE 0.93  < > 1.00  --   CALCIUM 9.2  < > 8.9  --   MG  --   < > 1.9 1.8  PHOS  --   < > 4.2 2.5  AST 18  --   --   --   ALT 11*  --   --   --   ALKPHOS 74  --   --   --   BILITOT 0.5  --   --   --   < > = values in this interval not displayed.   Recent Labs Lab 11/03/16 1107 11/03/16 1640 11/03/16 2042 11/03/16 2302 11/04/16 0415 11/04/16 0746  GLUCAP 232* 213* 161* 184* 170* 165*    Recent Labs Lab 11/03/16 0153 11/03/16 0423  PHART 7.35 7.33*  PCO2ART 42 38  PO2ART 90 92    Recent Labs Lab 10/28/16 0921  AST 18  ALT 11*  ALKPHOS 74  BILITOT 0.5  ALBUMIN 3.4*    Cardiac Enzymes  Recent Labs Lab 11/06/2016 2215  TROPONINI <0.03    RADIOLOGY:  Dg Chest 1 View  Result Date: 11/03/2016 CLINICAL DATA:  Followup thoracentesis EXAM: CHEST 1  VIEW COMPARISON:  Earlier same day FINDINGS: Endotracheal tube tip is 4 cm above the carina. Nasogastric tube enters the abdomen. Power port unchanged with its tip at the SVC RA junction. Right upper lobe collapse persists. No pneumothorax on the right. Volume loss in  the left upper lobe as seen previously is slightly improved. IMPRESSION: No pneumothorax post right thoracentesis. Persistent right upper lobe collapse. Better aeration in the left upper lobe. Electronically Signed   By: Nelson Chimes M.D.   On: 11/03/2016 18:04   Dg Chest 1 View  Result Date: 11/03/2016 CLINICAL DATA:  Endotracheal tube placement EXAM: CHEST 1 VIEW COMPARISON:  10/20/2016 FINDINGS: An endotracheal tube was placed with tip measuring 4.1 cm above the carina. Enteric tube tip is localized in the left upper quadrant consistent with location in the upper stomach. Proximal side hole is near the location of the EG junction. Right central venous catheter with tip over the low SVC region. No pneumothorax. Right upper lobe collapse and consolidation with less prominent left upper lobe collapse and consolidation, progressing since previous study. Linear scarring in the left mid lung. Small right pleural effusion. Normal heart size. IMPRESSION: Appliances positioned as described with enteric tube tip in the left upper quadrant but proximal side hole near the EG junction. Endotracheal tube is in satisfactory position. Progressive bilateral upper lobe collapse and consolidation. Electronically Signed   By: Lucienne Capers M.D.   On: 11/03/2016 04:04   Dg Abd 1 View  Result Date: 11/03/2016 CLINICAL DATA:  NG placement EXAM: ABDOMEN - 1 VIEW COMPARISON:  None. FINDINGS: Enteric tube is present with tip in the left upper quadrant consistent with location in the upper stomach. Proximal side hole is projected over the expected location of the EG junction and may be in the lower esophagus. Consider advancement. Elevation of the right  hemidiaphragm with small right pleural effusion. Residual contrast material in the urinary tract. IMPRESSION: Enteric tube tip is in the left upper quadrant the proximal side hole projects over the expected location of the EG junction, possibly in the lower esophagus. Consider advancing the tube for better placement. Electronically Signed   By: Lucienne Capers M.D.   On: 11/03/2016 04:05   Ct Head W & Wo Contrast  Result Date: 11/04/2016 CLINICAL DATA:  Lung cancer. Evaluation for intracranial metastases. EXAM: CT HEAD WITHOUT AND WITH CONTRAST TECHNIQUE: Contiguous axial images were obtained from the base of the skull through the vertex without and with intravenous contrast CONTRAST:  159m ISOVUE-300 IOPAMIDOL (ISOVUE-300) INJECTION 61% COMPARISON:  None. FINDINGS: Brain: No mass lesion, intraparenchymal hemorrhage or extra-axial collection. No evidence of acute cortical infarct. Brain parenchyma and CSF-containing spaces are normal for age. Vascular: No hyperdense vessel or unexpected calcification. Skull: Normal visualized skull base, calvarium and extracranial soft tissues. Sinuses/Orbits: No sinus fluid levels or advanced mucosal thickening. No mastoid effusion. Normal orbits. IMPRESSION: No intracranial metastatic disease. Electronically Signed   By: KUlyses JarredM.D.   On: 11/04/2016 02:19   Ct Angio Chest Pe W And/or Wo Contrast  Result Date: 11/03/2016 CLINICAL DATA:  Difficulty breathing and shortness of breath. History of right lung cancer. EXAM: CT ANGIOGRAPHY CHEST WITH CONTRAST TECHNIQUE: Multidetector CT imaging of the chest was performed using the standard protocol during bolus administration of intravenous contrast. Multiplanar CT image reconstructions and MIPs were obtained to evaluate the vascular anatomy. CONTRAST:  75 mL Isovue 370 COMPARISON:  05/07/2016 FINDINGS: Cardiovascular: Good opacification of the central and segmental pulmonary arteries. No focal filling defects. No  evidence of significant pulmonary embolus. Normal heart size. Moderate-sized pericardial effusion. Normal caliber thoracic aorta with scattered calcifications. Mediastinum/Nodes: Esophagus is decompressed. Probable lymphadenopathy in the right posterior hilum causing extrinsic narrowing of the bronchus intermedius. This is progressing since previous  study. Lungs/Pleura: There is collapse and consolidation of the right upper lung with abrupt termination of the right upper lung bronchus suggesting an obstructing mass or endobronchial lesion. Areas of linear atelectasis demonstrated in the right middle lung. Calcified right pleural plaque. Left upper lung is collapsed with air bronchograms. Can't exclude an endobronchial lesion. Hyperinflation of left lower lobe. Large right pleural effusion. No left pleural effusion. No pneumothorax. Upper Abdomen: No acute abnormality. Musculoskeletal: No chest wall abnormality. No acute or significant osseous findings. Review of the MIP images confirms the above findings. IMPRESSION: 1. Collapse and consolidation of the right upper lung with abrupt termination of right upper lung bronchus suggesting an obstructing mass or endobronchial lesion. Right hilar lymphadenopathy causing extrinsic compression of the right bronchus intermedius. Large right pleural effusion. 2. Collapse and consolidation of the left upper lung with compensatory hyperinflation of the left lower lung. Can't exclude an endobronchial lesion urethra. 3. Pericardial effusion. No evidence of significant pulmonary embolus. Electronically Signed   By: Lucienne Capers M.D.   On: 11/03/2016 00:13   Dg Chest Port 1 View  Result Date: 10/10/2016 CLINICAL DATA:  70 y/o M; respiratory distress and extreme shortness of breath. History of lung cancer. EXAM: PORTABLE CHEST 1 VIEW COMPARISON:  09/04/2016 chest radiograph. FINDINGS: Volume loss in the right lung and consolidation of the right lung apex has progressed from  prior radiographs which may represent a combination of posttreatment changes, progressive malignancy, and/or pulmonary consolidation. Stable cardiac silhouette given projection and technique. Left mid lung zone linear scarring is unchanged. Blunted right costal diaphragmatic angle may represent a small effusion. Right port catheter tip projects over lower SVC. Bones are unremarkable. IMPRESSION: Volume loss in the right lung and consolidation of the right lung apex has progressed from prior radiographs which may represent a combination of posttreatment changes, progressive malignancy, and/or pulmonary consolidation. Electronically Signed   By: Kristine Garbe M.D.   On: 10/29/2016 22:56   Dg Abd Portable 1v  Result Date: 11/03/2016 CLINICAL DATA:  70 year old male with nasogastric tube placement. Subsequent encounter. EXAM: PORTABLE ABDOMEN - 1 VIEW COMPARISON:  11/03/2016 10:52 a.m. FINDINGS: Nasogastric tube has been advanced. The tip is at the level of the body/antrum junction. Bowel gas pattern unremarkable. Residual contrast within the bladder from recent chest CT. Foley catheter appears in place. IMPRESSION: Nasogastric tube has been advanced. The tip is at the level of the body/antrum junction level. Side hole fundus-body junction level. Electronically Signed   By: Genia Del M.D.   On: 11/03/2016 12:09   Dg Abd Portable 1v  Result Date: 11/03/2016 CLINICAL DATA:  NG tube placement. History of lung malignancy and gastroesophageal reflux and respiratory failure. EXAM: PORTABLE ABDOMEN - 1 VIEW COMPARISON:  KUB of November 03, 2016 upper earlier today. FINDINGS: The NG tube has been advanced such that the proximal port now all lies just below the GE junction and the tip in the mid gastric body. The bowel gas pattern is unremarkable. There is increased density at the right lung base which is stable. IMPRESSION: Interval advancement of the nasogastric tube by approximately 5 DIS 7 cm hest  both the proximal port and the tip in the gastric body. Advancement by an additional 5 cm would be useful to assure that the proximal port does remain below the GE junction. Electronically Signed   By: David  Martinique M.D.   On: 11/03/2016 11:09   US Thoracentesis Asp Pleural Space W/img Guide  Result Date: 11/04/2016 INDICATION: Respiratory  distress and intubated patient. Right pleural effusion noted on recent CT. EXAM: ULTRASOUND GUIDED RIGHT THORACENTESIS MEDICATIONS: None. COMPLICATIONS: None immediate. PROCEDURE: An ultrasound guided thoracentesis was thoroughly discussed with the patient's family and questions answered. The benefits, risks, alternatives and complications were also discussed. The patient's family understands and wishes to proceed with the procedure. Written consent was obtained. Ultrasound was performed to localize and mark an adequate pocket of fluid in the right chest. The area was then prepped and draped in the normal sterile fashion. 1% Lidocaine was used for local anesthesia. Under ultrasound guidance a 6 Fr Safe-T-Centesis catheter was introduced. Thoracentesis was performed. The catheter was removed and a dressing applied. FINDINGS: A total of approximately 1 L of amber colored fluid was removed. Samples were sent to the laboratory as requested by the clinical team. IMPRESSION: Successful ultrasound guided right thoracentesis yielding 1 L of pleural fluid. Electronically Signed   By: Markus Daft M.D.   On: 11/04/2016 07:34    I have personally obtained a history, examined the patient, evaluated Pertinent laboratory and RadioGraphic/imaging results, and  formulated the assessment and plan   The Patient requires high complexity decision making for assessment and support, frequent evaluation and titration of therapies, application of advanced monitoring technologies and extensive interpretation of multiple databases. Critical Care Time devoted to patient care services described in  this note is 35 minutes.   Overall, patient is critically ill, prognosis is guarded.  Patient with Multiorgan failure and at high risk for cardiac arrest and death.  Would recommend DNR status, plan for transfer to tertiary care center to assess for bronchial stenting if family wants.    Corrin Parker, M.D.  [facility]  Medical Director [place] Director [facility] Cardio-Pulmonary Department

## 2016-11-05 LAB — GLUCOSE, CAPILLARY
GLUCOSE-CAPILLARY: 159 mg/dL — AB (ref 65–99)
GLUCOSE-CAPILLARY: 179 mg/dL — AB (ref 65–99)
GLUCOSE-CAPILLARY: 130 mg/dL — AB (ref 65–99)
GLUCOSE-CAPILLARY: 131 mg/dL — AB (ref 65–99)
Glucose-Capillary: 123 mg/dL — ABNORMAL HIGH (ref 65–99)

## 2016-11-05 LAB — CBC
HEMATOCRIT: 28.1 % — AB (ref 40.0–52.0)
HEMOGLOBIN: 9.3 g/dL — AB (ref 13.0–18.0)
MCH: 26.8 pg (ref 26.0–34.0)
MCHC: 33.1 g/dL (ref 32.0–36.0)
MCV: 80.8 fL (ref 80.0–100.0)
Platelets: 292 10*3/uL (ref 150–440)
RBC: 3.47 MIL/uL — AB (ref 4.40–5.90)
RDW: 16.6 % — ABNORMAL HIGH (ref 11.5–14.5)
WBC: 11.7 10*3/uL — ABNORMAL HIGH (ref 3.8–10.6)

## 2016-11-05 LAB — BASIC METABOLIC PANEL
ANION GAP: 6 (ref 5–15)
BUN: 27 mg/dL — ABNORMAL HIGH (ref 6–20)
CHLORIDE: 101 mmol/L (ref 101–111)
CO2: 23 mmol/L (ref 22–32)
Calcium: 8.9 mg/dL (ref 8.9–10.3)
Creatinine, Ser: 0.87 mg/dL (ref 0.61–1.24)
GFR calc non Af Amer: >60 mL/min (ref >60)
Glucose, Bld: 203 mg/dL — ABNORMAL HIGH (ref 65–99)
POTASSIUM: 4 mmol/L (ref 3.5–5.1)
SODIUM: 130 mmol/L — AB (ref 135–145)

## 2016-11-05 LAB — MAGNESIUM: MAGNESIUM: 1.9 mg/dL (ref 1.7–2.4)

## 2016-11-05 LAB — PROCALCITONIN

## 2016-11-05 MED ORDER — SODIUM CHLORIDE 0.9 % IV SOLN
INTRAVENOUS | Status: DC
  Administered 2016-11-05 – 2016-11-06 (×3): via INTRAVENOUS
  Administered 2016-11-07: 75 mL/h via INTRAVENOUS
  Administered 2016-11-07: 01:00:00 via INTRAVENOUS

## 2016-11-05 MED ORDER — METHYLPREDNISOLONE SODIUM SUCC 40 MG IJ SOLR
20.0000 mg | Freq: Two times a day (BID) | INTRAMUSCULAR | Status: DC
  Administered 2016-11-05 – 2016-11-07 (×5): 20 mg via INTRAVENOUS
  Filled 2016-11-05 (×5): qty 1

## 2016-11-05 MED ORDER — FENTANYL 2500MCG IN NS 250ML (10MCG/ML) PREMIX INFUSION
INTRAVENOUS | Status: AC
  Filled 2016-11-05: qty 250

## 2016-11-05 MED ORDER — FENTANYL 2500MCG IN NS 250ML (10MCG/ML) PREMIX INFUSION
25.0000 ug/h | INTRAVENOUS | Status: DC
  Administered 2016-11-05: 50 ug/h via INTRAVENOUS
  Administered 2016-11-06: 25 ug/h via INTRAVENOUS
  Administered 2016-11-06 (×2): 250 ug/h via INTRAVENOUS
  Administered 2016-11-07: 150 ug/h via INTRAVENOUS
  Administered 2016-11-07: 250 ug/h via INTRAVENOUS
  Filled 2016-11-05 (×4): qty 250

## 2016-11-05 MED ORDER — FENTANYL CITRATE (PF) 100 MCG/2ML IJ SOLN
50.0000 ug | INTRAMUSCULAR | Status: DC | PRN
  Administered 2016-11-05: 50 ug via INTRAVENOUS
  Filled 2016-11-05 (×2): qty 2

## 2016-11-05 MED ORDER — FENTANYL 2500MCG IN NS 250ML (10MCG/ML) PREMIX INFUSION: 10.0000 ug/h | INTRAVENOUS | Status: DC

## 2016-11-05 NOTE — Progress Notes (Signed)
PULMONARY / CRITICAL CARE MEDICINE   Name: LENTON GENDREAU MRN: [id number] DOB: Oct 30, 1946    ADMISSION DATE:  11/01/2016 CONSULTATION DATE: 10/21/2016  REFERRING MD: Dr.Graydon  CHIEF COMPLAINT:  Dysnea  Discussion: 70 yo male with recurrent stage 4 lung cancer with acute respiratory failure, likely due to right airway obstruction with stridor along with right pleural effussion.    SUBJECTIVE:  Patient remains sedated and on ventilator.Afebrile.  No issues overnight. Plan for SAT/SBt when family arrives  VITAL SIGNS: BP (!) 85/61   Pulse (!) 101   Temp 98.7 F (37.1 C) (Oral)   Resp 13   Ht '5\' 9"'$  (1.753 m)   Wt 76.2 kg (167 lb 15.9 oz)   SpO2 95%   BMI 24.81 kg/m   HEMODYNAMICS:    VENTILATOR SETTINGS: Vent Mode: PRVC FiO2 (%):  [28 %] 28 % Set Rate:  [14 bmp] 14 bmp Vt Set:  [570 mL] 570 mL PEEP:  [5 cmH20] 5 cmH20  INTAKE / OUTPUT: I/O last 3 completed shifts: In: 5000.6 [I.V.:3533.6; NG/GT:1117; IV Piggyback:350] Out: 1055 [Urine:1055]  PHYSICAL EXAMINATION: General:  Elderly, sickly appearing male intubated and on mechanical ventilation Neuro:  Opens eyes spontaneously upon moving, does not follow any commands HEENT:  Atraumatic, normocephalic, no discharge, no JVD appreciated Cardiovascular:  S1S2,RRR. No MRG noted Lungs:  Diminished RLL,no wheezes,crackles,rhonchi noted Abdomen:  Soft,nontender, no guarding Musculoskeletal:  No inflammation/deformity noted Skin:  Grossly intact  LABS:  BMET  Recent Labs Lab 10/28/2016 2215 11/03/16 0413 11/04/16 1935  NA 136 136 130*  K 3.9 3.9 3.9  CL 103 105 100*  CO2 21* 22 23  BUN 14 14 22*  CREATININE 1.15 1.00 0.97  GLUCOSE 180* 189* 171*    Electrolytes  Recent Labs Lab 10/16/2016 2215 11/03/16 0413 11/04/16 0513 11/04/16 1935  CALCIUM 9.3 8.9  --  8.9  MG  --  1.9 1.8 1.8  PHOS  --  4.2 2.5  --     CBC  Recent Labs Lab 10/08/2016 2215 11/03/16 0413  WBC 14.8* 7.0  HGB 12.4* 11.8*  HCT  37.5* 34.9*  PLT 575* 366    Coag's No results for input(s): APTT, INR in the last 168 hours.  Sepsis Markers  Recent Labs Lab 11/03/16 0413 11/04/16 0513  PROCALCITON 0.11 0.14    ABG  Recent Labs Lab 11/03/16 0153 11/03/16 0423  PHART 7.35 7.33*  PCO2ART 42 38  PO2ART 90 92    Liver Enzymes No results for input(s): AST, ALT, ALKPHOS, BILITOT, ALBUMIN in the last 168 hours.  Cardiac Enzymes  Recent Labs Lab 10/29/2016 2215  TROPONINI <0.03    Glucose  Recent Labs Lab 11/04/16 0746 11/04/16 1147 11/04/16 1553 11/04/16 1957 11/04/16 2335 11/05/16 0351  GLUCAP 165* 158* 167* 160* 181* 159*    Imaging No results found.     ASSESSMENT / PLAN:  PULMONARY A: Acute hypoxic resp failure.  Right pleural effusion. 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  RLL atelectasis  P:   Continue full vent support, wean as tolerated SBT trials Thoracentesis 11/27 and removed 1l of fluid Continue Bronchodilators Continue Antibiotics Plan for SAT/SBT  CARDIOVASCULAR A:  No active issues P:  Continuous telemetry SR Keep MAP goals>60   RENAL A:   hyponatremia P:   N/S '@75'$  Replace electrolytes per usual guidelines  GASTROINTESTINAL A:   GERD P:   Famotidine  for GIP  HEMATOLOGIC A:   No active issues P:  Enoxaparin for DVTP  INFECTIOUS A:   Leukocytosis-resolved P:   Follow cultures Monitor fever curve Continue antibiotics Micro/culture results:   UC --No growth Sputum-- Pleural fluid 11/27>>  Antibiotics:cefepime 11/27   ENDOCRINE A:   No active  P:   Blood glucose checks with SSI Coverage  NEUROLOGIC A:   No active issues P:   RASS goal: 0 to -1 Minimize sedation  I have personally obtained a history, examined the patient, evaluated Pertinent laboratory and RadioGraphic/imaging results, and  formulated  the assessment and plan   The Patient requires high complexity decision making for assessment and support, frequent evaluation and titration of therapies, application of advanced monitoring technologies and extensive interpretation of multiple databases. Critical Care Time devoted to patient care services described in this note is 35 minutes.   Overall, patient is critically ill, prognosis is very poor.  Patient with Multiorgan failure and at high risk for cardiac arrest and death.  Patient is now DNR. I Anticipate failure to wean from vent  Corrin Parker, M.D.  [facility]  Medical Director [place] Director [facility] Cardio-Pulmonary Department

## 2016-11-06 ENCOUNTER — Ambulatory Visit: Payer: Self-pay | Admitting: Pulmonary Disease

## 2016-11-06 LAB — GLUCOSE, CAPILLARY
GLUCOSE-CAPILLARY: 149 mg/dL — AB (ref 65–99)
GLUCOSE-CAPILLARY: 144 mg/dL — AB (ref 65–99)
Glucose-Capillary: 127 mg/dL — ABNORMAL HIGH (ref 65–99)
Glucose-Capillary: 134 mg/dL — ABNORMAL HIGH (ref 65–99)
Glucose-Capillary: 149 mg/dL — ABNORMAL HIGH (ref 65–99)
Glucose-Capillary: 158 mg/dL — ABNORMAL HIGH (ref 65–99)

## 2016-11-06 LAB — CYTOLOGY - NON PAP

## 2016-11-06 MED ORDER — VECURONIUM BROMIDE 10 MG IV SOLR
INTRAVENOUS | Status: AC
  Administered 2016-11-06: 10 mg via INTRAVENOUS
  Filled 2016-11-06: qty 10

## 2016-11-06 MED ORDER — VECURONIUM BROMIDE 10 MG IV SOLR
10.0000 mg | Freq: Once | INTRAVENOUS | Status: AC
  Administered 2016-11-06: 10 mg via INTRAVENOUS

## 2016-11-06 MED ORDER — STERILE WATER FOR INJECTION IJ SOLN
INTRAMUSCULAR | Status: AC
  Administered 2016-11-06: 10 mL
  Filled 2016-11-06: qty 10

## 2016-11-06 MED ORDER — DOCUSATE SODIUM 50 MG/5ML PO LIQD
100.0000 mg | Freq: Every day | ORAL | Status: DC
  Administered 2016-11-06 – 2016-11-07 (×2): 100 mg
  Filled 2016-11-06 (×2): qty 10

## 2016-11-06 MED ORDER — SENNOSIDES 8.8 MG/5ML PO SYRP
10.0000 mL | ORAL_SOLUTION | Freq: Every day | ORAL | Status: DC
  Administered 2016-11-06 – 2016-11-07 (×2): 10 mL
  Filled 2016-11-06 (×3): qty 10

## 2016-11-06 NOTE — Progress Notes (Signed)
Chaplain rounded the unit to provide a compassionate presence and support to the patient. Patient appeared to be sleeping.  Daughter was sitting at the bedside. S prayer was said on behalf of the patient. Christopher Huber [phone]

## 2016-11-06 NOTE — Progress Notes (Signed)
PULMONARY / CRITICAL CARE MEDICINE   Name: Christopher Huber MRN: [id number] DOB: [birth year]    ADMISSION DATE:  10/28/2016 CONSULTATION DATE: 10/15/2016  REFERRING MD: Dr.Graydon  CHIEF COMPLAINT:  Dysnea  Discussion: 70 yo male with recurrent stage 4 lung cancer with acute respiratory failure, likely due to right airway obstruction with stridor along with right pleural effussion.    SUBJECTIVE:  Patient remains sedated and on ventilator.Afebrile.  No issues overnight.Plan for SAT/SBt when family arrives, howvere, patient with increased WOB, increased RR and HR, will not attempt SAT/SBt today  VITAL SIGNS: BP (!) 87/61 (BP Location: Left Arm)   Pulse (!) 103   Temp 98.1 F (36.7 C) (Oral)   Resp 15   Ht '5\' 9"'$  (1.753 m)   Wt 76.2 kg (167 lb 15.9 oz)   SpO2 93%   BMI 24.81 kg/m   HEMODYNAMICS:    VENTILATOR SETTINGS: Vent Mode: PRVC FiO2 (%):  [28 %-40 %] 35 % Set Rate:  [14 bmp-15 bmp] 15 bmp Vt Set:  [420 mL-570 mL] 420 mL PEEP:  [5 cmH20] 5 cmH20 Plateau Pressure:  [16 cmH20] 16 cmH20  INTAKE / OUTPUT: I/O last 3 completed shifts: In: 5117.5 [I.V.:2658.5; NG/GT:2359; IV Piggyback:100] Out: 4315 [Urine:1825]  PHYSICAL EXAMINATION: General:  Elderly, sickly appearing male intubated and on mechanical ventilation Neuro:  Opens eyes spontaneously upon moving, does not follow any commands HEENT:  Atraumatic, normocephalic, no discharge, no JVD appreciated Cardiovascular:  S1S2,RRR. No MRG noted Lungs:  Diminished RLL,no wheezes,crackles,rhonchi noted Abdomen:  Soft,nontender, no guarding Musculoskeletal:  No inflammation/deformity noted Skin:  Grossly intact  LABS:  BMET  Recent Labs Lab 11/03/16 0413 11/04/16 1935 11/05/16 0553  NA 136 130* 130*  K 3.9 3.9 4.0  CL 105 100* 101  CO2 '22 23 23  '$ BUN 14 22* 27*  CREATININE 1.00 0.97 0.87  GLUCOSE 189* 171* 203*    Electrolytes  Recent Labs Lab 11/03/16 0413 11/04/16 0513 11/04/16 1935 11/05/16 0553   CALCIUM 8.9  --  8.9 8.9  MG 1.9 1.8 1.8 1.9  PHOS 4.2 2.5  --   --     CBC  Recent Labs Lab 10/12/2016 2215 11/03/16 0413 11/05/16 0553  WBC 14.8* 7.0 11.7*  HGB 12.4* 11.8* 9.3*  HCT 37.5* 34.9* 28.1*  PLT 575* 366 292    Coag's No results for input(s): APTT, INR in the last 168 hours.  Sepsis Markers  Recent Labs Lab 11/03/16 0413 11/04/16 0513 11/05/16 0553  PROCALCITON 0.11 0.14 <0.10    ABG  Recent Labs Lab 11/03/16 0153 11/03/16 0423  PHART 7.35 7.33*  PCO2ART 42 38  PO2ART 90 92    Liver Enzymes No results for input(s): AST, ALT, ALKPHOS, BILITOT, ALBUMIN in the last 168 hours.  Cardiac Enzymes  Recent Labs Lab 11/01/2016 2215  TROPONINI <0.03    Glucose  Recent Labs Lab 11/05/16 0736 11/05/16 1139 11/05/16 1616 11/05/16 1928 11/06/16 0016 11/06/16 0355  GLUCAP 179* 130* 131* 123* 127* 149*    Imaging No results found.     ASSESSMENT / PLAN:  PULMONARY A: Acute hypoxic resp failure.  Right pleural effusion. 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  RLL atelectasis  P:   Continue full vent support, wean as tolerated SBT trials Thoracentesis 11/27 and removed 1l of fluid Continue Bronchodilators Continue Antibiotics   CARDIOVASCULAR A:  No active issues P:  Continuous  telemetry SR Keep MAP goals>60   RENAL A:   hyponatremia P:   N/S '@75'$  Replace electrolytes per usual guidelines  GASTROINTESTINAL A:   GERD P:   Famotidine for GIP  HEMATOLOGIC A:   No active issues P:  Enoxaparin for DVTP  INFECTIOUS A:   Leukocytosis-resolved P:   Follow cultures Monitor fever curve Continue antibiotics Micro/culture results:   UC --No growth Sputum-- Pleural fluid 11/27>>  Antibiotics:cefepime 11/27   ENDOCRINE A:   No active  P:   Blood glucose checks with SSI  Coverage  NEUROLOGIC A:   No active issues P:   RASS goal: 0 to -1 Minimize sedation  I have personally obtained a history, examined the patient, evaluated Pertinent laboratory and RadioGraphic/imaging results, and  formulated the assessment and plan   The Patient requires high complexity decision making for assessment and support, frequent evaluation and titration of therapies, application of advanced monitoring technologies and extensive interpretation of multiple databases. Critical Care Time devoted to patient care services described in this note is 35 minutes.   Overall, patient is critically ill, prognosis is guarded.  Patient with Multiorgan failure and at high risk for cardiac arrest and death.  I anticipate prolonged vent support, will need to update family of very poor prognosis, patient with very poor chance of meaningful recovery  Phill Steck Patricia Pesa, M.D.  [facility]  Medical Director [place] Director [facility] Cardio-Pulmonary Department

## 2016-11-06 NOTE — Progress Notes (Signed)
Dr Mortimer Fries and family notified of patient having agonal breathing, per MD do not wean or do sedation vacation today, will possibly try tomorrow.

## 2016-11-07 DIAGNOSIS — C3491 Malignant neoplasm of unspecified part of right bronchus or lung: Secondary | ICD-10-CM

## 2016-11-07 DIAGNOSIS — R918 Other nonspecific abnormal finding of lung field: Secondary | ICD-10-CM

## 2016-11-07 LAB — GLUCOSE, CAPILLARY
GLUCOSE-CAPILLARY: 135 mg/dL — AB (ref 65–99)
Glucose-Capillary: 121 mg/dL — ABNORMAL HIGH (ref 65–99)
Glucose-Capillary: 126 mg/dL — ABNORMAL HIGH (ref 65–99)
Glucose-Capillary: 140 mg/dL — ABNORMAL HIGH (ref 65–99)
Glucose-Capillary: 144 mg/dL — ABNORMAL HIGH (ref 65–99)

## 2016-11-07 LAB — CBC WITH DIFFERENTIAL/PLATELET
Basophils Absolute: 0 10*3/uL (ref 0–0.1)
Basophils Relative: 0 %
EOS ABS: 0 10*3/uL (ref 0–0.7)
Eosinophils Relative: 0 %
HCT: 28 % — ABNORMAL LOW (ref 40.0–52.0)
HEMOGLOBIN: 9.3 g/dL — AB (ref 13.0–18.0)
LYMPHS ABS: 0.1 10*3/uL — AB (ref 1.0–3.6)
LYMPHS PCT: 2 %
MCH: 26.4 pg (ref 26.0–34.0)
MCHC: 33.2 g/dL (ref 32.0–36.0)
MCV: 79.5 fL — AB (ref 80.0–100.0)
Monocytes Absolute: 0.6 10*3/uL (ref 0.2–1.0)
Monocytes Relative: 8 %
NEUTROS PCT: 90 %
Neutro Abs: 7.3 10*3/uL — ABNORMAL HIGH (ref 1.4–6.5)
Platelets: 264 10*3/uL (ref 150–440)
RBC: 3.52 MIL/uL — AB (ref 4.40–5.90)
RDW: 17 % — ABNORMAL HIGH (ref 11.5–14.5)
WBC: 8.1 10*3/uL (ref 3.8–10.6)

## 2016-11-07 LAB — BASIC METABOLIC PANEL
Anion gap: 3 — ABNORMAL LOW (ref 5–15)
BUN: 32 mg/dL — AB (ref 6–20)
CHLORIDE: 105 mmol/L (ref 101–111)
CO2: 27 mmol/L (ref 22–32)
Calcium: 8.9 mg/dL (ref 8.9–10.3)
Creatinine, Ser: 0.68 mg/dL (ref 0.61–1.24)
GFR calc Af Amer: >60 mL/min (ref >60)
GFR calc non Af Amer: >60 mL/min (ref >60)
Glucose, Bld: 142 mg/dL — ABNORMAL HIGH (ref 65–99)
POTASSIUM: 4.9 mmol/L (ref 3.5–5.1)
SODIUM: 135 mmol/L (ref 135–145)

## 2016-11-07 LAB — TRIGLYCERIDES: Triglycerides: 74 mg/dL

## 2016-11-07 MED ORDER — MIDAZOLAM HCL 2 MG/2ML IJ SOLN
4.0000 mg | Freq: Once | INTRAMUSCULAR | Status: AC
  Administered 2016-11-07: 4 mg via INTRAVENOUS

## 2016-11-07 MED ORDER — FAMOTIDINE 40 MG/5ML PO SUSR
20.0000 mg | Freq: Two times a day (BID) | ORAL | Status: DC
  Filled 2016-11-07: qty 2.5

## 2016-11-07 MED ORDER — FENTANYL BOLUS VIA INFUSION
100.0000 ug | Freq: Once | INTRAVENOUS | Status: AC
  Administered 2016-11-07: 100 ug via INTRAVENOUS
  Filled 2016-11-07: qty 100

## 2016-11-07 MED ORDER — PROPOFOL 1000 MG/100ML IV EMUL
5.0000 ug/kg/min | INTRAVENOUS | Status: DC
  Administered 2016-11-07: 10 ug/kg/min via INTRAVENOUS
  Filled 2016-11-07: qty 100

## 2016-11-07 MED ORDER — ALPRAZOLAM 1 MG PO TABS: 1.0000 mg | ORAL_TABLET | Freq: Three times a day (TID) | ORAL | Status: DC

## 2016-11-07 MED ORDER — MORPHINE 100MG IN NS 100ML (1MG/ML) PREMIX INFUSION
10.0000 mg/h | INTRAVENOUS | Status: DC
  Administered 2016-11-07: 10 mg/h via INTRAVENOUS
  Filled 2016-11-07: qty 100

## 2016-11-07 MED ORDER — LORAZEPAM BOLUS VIA INFUSION: 2.0000 mg | INTRAVENOUS | Status: DC | PRN

## 2016-11-07 MED ORDER — LORAZEPAM 2 MG/ML IJ SOLN: 2.0000 mg | INTRAMUSCULAR | Status: DC | PRN

## 2016-11-07 MED ORDER — MORPHINE BOLUS VIA INFUSION
5.0000 mg | INTRAVENOUS | Status: DC | PRN
  Filled 2016-11-07: qty 20

## 2016-11-07 DEATH — deceased

## 2016-11-08 ENCOUNTER — Encounter: Payer: Self-pay | Admitting: Adult Health

## 2016-11-08 LAB — BODY FLUID CULTURE: Culture: NO GROWTH

## 2016-11-10 ENCOUNTER — Ambulatory Visit: Payer: Medicare Other

## 2016-11-11 ENCOUNTER — Inpatient Hospital Stay: Payer: Medicare Other

## 2016-11-11 ENCOUNTER — Inpatient Hospital Stay: Payer: Medicare Other | Admitting: Oncology

## 2016-11-13 ENCOUNTER — Telehealth: Payer: Self-pay | Admitting: Pulmonary Disease

## 2016-11-13 ENCOUNTER — Ambulatory Visit: Payer: Self-pay | Admitting: Oncology

## 2016-11-13 NOTE — Telephone Encounter (Signed)
Spoke with Jeneen Rinks with [employer], who states he brought death certificate by on March 15, 2023. It doesn't seem that we have received a death certificate on this pt. I spoke with the lady's up front as well. Jeneen Rinks states he will bring another one by tomorrow to be signed ASAP as this is holding thing up. Will hold this message in triage until received.

## 2016-11-13 NOTE — Telephone Encounter (Signed)
[facility] calling asking if we can fax over the signed death certificate to them for cremation Please fax to [phone]

## 2016-11-14 NOTE — Telephone Encounter (Signed)
Death certificate received from [employer].  Placed on [place] desk.  Please call and fax when ready.

## 2016-11-15 ENCOUNTER — Other Ambulatory Visit: Payer: Self-pay | Admitting: Nurse Practitioner

## 2016-11-20 NOTE — Telephone Encounter (Signed)
Death certificate signed and placed up front for pick up. [employer] home made aware. Nothing further needed.

## 2016-11-27 ENCOUNTER — Ambulatory Visit: Payer: Medicare Other

## 2016-12-02 ENCOUNTER — Ambulatory Visit: Payer: Self-pay | Admitting: Oncology

## 2016-12-04 ENCOUNTER — Ambulatory Visit: Payer: Self-pay | Admitting: Oncology

## 2016-12-08 NOTE — Progress Notes (Signed)
PHARMACIST - PHYSICIAN COMMUNICATION  DR:   Mortimer Fries  CONCERNING: IV to Oral Route Change Policy  RECOMMENDATION: This patient is receiving famotidine by the intravenous route.  Based on criteria approved by the Pharmacy and Therapeutics Committee, the intravenous medication(s) is/are being converted to the equivalent oral dose form(s).   DESCRIPTION: These criteria include:  The patient is eating (either orally or via tube) and/or has been taking other orally administered medications for a least 24 hours  The patient has no evidence of active gastrointestinal bleeding or impaired GI absorption (gastrectomy, short bowel, patient on TNA or NPO).  If you have questions about this conversion, please contact the Pharmacy Department  '[]'$   [phone] )  [facility] '[x]'$   [phone] )  [facility] '[]'$   [phone] )  [facility] '[]'$   [phone] )  [facility] '[]'$   [phone] )  [place], [facility] Nov 29, 2016 3:08 PM

## 2016-12-08 NOTE — Progress Notes (Signed)
I have had lengthy discussion with family and after assessment of clinical status and end stage lung cancer, patient has failed multiple SAT/SBT's, the wife and family have decided to pursue comfort care measures.  Will place comfort care orders, will start morphine infusion and proceed with terminal extubation when family is ready     Patient/Family are satisfied with Plan of action and management. All questions answered  Corrin Parker, M.D.  [facility]  Medical Director [place] Director [facility] Cardio-Pulmonary Department

## 2016-12-08 NOTE — Progress Notes (Signed)
Patient apneic and pulseless w/ no heart sounds at 1905 hours; asystole on monitor according to central telemetry. Marda Stalker, NP made aware.

## 2016-12-08 NOTE — Progress Notes (Signed)
Nutrition Follow-up  DOCUMENTATION CODES:   Severe malnutrition in context of acute illness/injury  INTERVENTION:  -with current diprivan, recommend continuing current TF regimen of Vital High Protein at 60 ml/hr with Prostat daily. Continue to assess -Noted no BM since 11/26, pt has bowel regimen but may benefit from further intervention   NUTRITION DIAGNOSIS:   Malnutrition related to acute illness, chronic illness as evidenced by percent weight loss, energy intake < or equal to 50% for > or equal to 5 days.  GOAL:   Provide needs based on ASPEN/SCCM guidelines  MONITOR:   Vent status, Labs, Weight trends  REASON FOR ASSESSMENT:   Ventilator, Consult Enteral/tube feeding initiation and management  ASSESSMENT:   71 yo male admitted with acute respiratory failure with right pleural effusion, recurrent cancer with atelectasis, right airway obstruction  Pt remains on vent support, failed vent wean attempts, starting diprivan for sedation today, currently at 6.2 ml/hr (164 kcals) Vital High Protein at rate of 60 ml/hr, Prostat daily  Labs: sodium 135 (improved), TG wdl Meds: NS at 75 ml/hr, bowel regimen, solumedrol  Diet Order:  Diet NPO time specified  Skin:  Reviewed, no issues  Last BM:  11/26  Height:   Ht Readings from Last 1 Encounters:  11/03/16 '5\' 9"'$  (1.753 m)    Weight:   Wt Readings from Last 1 Encounters:  December 04, 2016 226 lb 3.1 oz (102.6 kg)    BMI:  Body mass index is 33.4 kg/m.  Estimated Nutritional Needs:   Kcal:  6950 kcals  Protein:  108-144 g  Fluid:  >/= 1.5 L  EDUCATION NEEDS:   No education needs identified at this time  [place], [place], [place] [phone] Pager  [phone] Weekend/On-Call Pager

## 2016-12-08 NOTE — Progress Notes (Signed)
Chaplain rounded the unit to provide a compassionate presence and support to the family. Patient who appeared to be sleeping placed on comfort care.  Family at the bedside during patients transition. Christopher Huber [phone]

## 2016-12-08 NOTE — Progress Notes (Signed)
PULMONARY / CRITICAL CARE MEDICINE   Name: Christopher Huber MRN: [id number] DOB: 05-06-1946    ADMISSION DATE:  10/16/2016 CONSULTATION DATE: 10/12/2016  REFERRING MD: Dr.Graydon  CHIEF COMPLAINT:  Dysnea  Discussion: 71 yo male with recurrent stage 4 lung cancer with acute respiratory failure, likely due to right airway obstruction with stridor along with right pleural effussion.    SUBJECTIVE:  Patient remains sedated and on ventilator.Afebrile.  No issues overnight.Plan for SAT/SBt when family arrives, Anticipate failed vent wean  VITAL SIGNS: BP 92/65   Pulse (!) 104   Temp 99.2 F (37.3 C) (Oral)   Resp 13   Ht '5\' 9"'$  (1.753 m)   Wt 226 lb 3.1 oz (102.6 kg)   SpO2 95%   BMI 33.40 kg/m   HEMODYNAMICS:    VENTILATOR SETTINGS: Vent Mode: PRVC FiO2 (%):  [35 %] 35 % Set Rate:  [15 bmp] 15 bmp Vt Set:  [420 mL] 420 mL PEEP:  [5 cmH20] 5 cmH20 Plateau Pressure:  [27 cmH20] 27 cmH20  INTAKE / OUTPUT: I/O last 3 completed shifts: In: 5370.5 [I.V.:3224.5; NG/GT:2146] Out: 1880 [Urine:1880]  PHYSICAL EXAMINATION: General:  Elderly, sickly appearing male intubated and on mechanical ventilation Neuro:  Opens eyes spontaneously upon moving, does not follow any commands HEENT:  Atraumatic, normocephalic, no discharge, no JVD appreciated Cardiovascular:  S1S2,RRR. No MRG noted Lungs:  Diminished RLL,no wheezes,crackles,rhonchi noted Abdomen:  Soft,nontender, no guarding Musculoskeletal:  No inflammation/deformity noted Skin:  Grossly intact  LABS:  BMET  Recent Labs Lab 11/04/16 1935 11/05/16 0553 2016-11-24 0104  NA 130* 130* 135  K 3.9 4.0 4.9  CL 100* 101 105  CO2 '23 23 27  '$ BUN 22* 27* 32*  CREATININE 0.97 0.87 0.68  GLUCOSE 171* 203* 142*    Electrolytes  Recent Labs Lab 11/03/16 0413 11/04/16 0513 11/04/16 1935 11/05/16 0553 11/24/2016 0104  CALCIUM 8.9  --  8.9 8.9 8.9  MG 1.9 1.8 1.8 1.9  --   PHOS 4.2 2.5  --   --   --     CBC  Recent  Labs Lab 11/03/16 0413 11/05/16 0553 11-24-2016 0104  WBC 7.0 11.7* 8.1  HGB 11.8* 9.3* 9.3*  HCT 34.9* 28.1* 28.0*  PLT 366 292 264    Coag's No results for input(s): APTT, INR in the last 168 hours.  Sepsis Markers  Recent Labs Lab 11/03/16 0413 11/04/16 0513 11/05/16 0553  PROCALCITON 0.11 0.14 <0.10    ABG  Recent Labs Lab 11/03/16 0153 11/03/16 0423  PHART 7.35 7.33*  PCO2ART 42 38  PO2ART 90 92    Liver Enzymes No results for input(s): AST, ALT, ALKPHOS, BILITOT, ALBUMIN in the last 168 hours.  Cardiac Enzymes  Recent Labs Lab 10/15/2016 2215  TROPONINI <0.03    Glucose  Recent Labs Lab 11/06/16 0735 11/06/16 1121 11/06/16 1604 11/06/16 1947 2016-11-24 0009 2016/11/24 0723  GLUCAP 144* 158* 134* 149* 121* 135*    Imaging No results found.     ASSESSMENT / PLAN:   71 yo white male with stage 4 Lung cancer with acute on chronic resp failure with extensive Lung cancer with collapsed lung Prognosis is very poor, failure to wean from vent   PULMONARY A: Acute hypoxic resp failure.  Right pleural effusion. 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  RLL atelectasis  P:   Continue full vent support, wean as tolerated SBT  trials Thoracentesis 11/27 and removed 1l of fluid +for malignancy(adenocarcinoma) Continue Bronchodilators Continue Antibiotics   CARDIOVASCULAR A:  No active issues P:  Continuous telemetry SR Keep MAP goals>60   RENAL A:   hyponatremia P:   N/S '@75'$  Replace electrolytes per usual guidelines  GASTROINTESTINAL A:   GERD P:   Famotidine for GIP  HEMATOLOGIC A:   No active issues P:  Enoxaparin for DVTP  INFECTIOUS A:   Leukocytosis-resolved P:   Follow cultures Monitor fever curve Continue antibiotics Micro/culture results:   UC --No growth Sputum-- Pleural fluid  11/27>>  Antibiotics:cefepime 11/27   ENDOCRINE A:   No active  P:   Blood glucose checks with SSI Coverage  NEUROLOGIC A:   No active issues P:   RASS goal: 0 to -1 Minimize sedation Plan for SAT/SBt when family arrives  I have personally obtained a history, examined the patient, evaluated Pertinent laboratory and RadioGraphic/imaging results, and  formulated the assessment and plan   The Patient requires high complexity decision making for assessment and support, frequent evaluation and titration of therapies, application of advanced monitoring technologies and extensive interpretation of multiple databases. Critical Care Time devoted to patient care services described in this note is 35 minutes.   Overall, patient is critically ill, prognosis is guarded.  Patient with Multiorgan failure and at high risk for cardiac arrest and death.  I anticipate prolonged vent support, will need to update family of very poor prognosis, patient with very poor chance of meaningful recovery  Darly Massi Patricia Pesa, M.D.  [facility]  Medical Director [place] Director [facility] Cardio-Pulmonary Department

## 2016-12-08 NOTE — Progress Notes (Signed)
Pt. Was suctioned prior to extubation for no secretions. Per Dr. Zoila Shutter order he was extubated to comfort care.

## 2016-12-08 NOTE — Discharge Summary (Signed)
[facility] Consultation   .Name: Christopher Huber MRN: [id number] DOB: 01-04-1946    ADMISSION DATE:  10/30/2016  CHIEF COMPLAINT:  Dyspnea  Admission diagnosis  Acute hypoxic respiratory failure.  Right pleural effusion. 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  RLL atelectasis due to above.  Intubated due to stridor and increased work of breathing.  Discharge diagnosis  Acute hypoxic respiratory failure.  Right pleural effusion. 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  RLL atelectasis due to above.  Intubated due to stridor and increased work of breathing.   HISTORY OF PRESENT ILLNESS:   Patient is a 71 yo with a history of with left upper lobe adenocarcinoma in 2014 and received 8 cycles of concurrent weekly carboplatinum and Taxol along with XRT between February 17, 2013 and Apr 07, 2013. He was recently diagnosed with recurrent bilateral adenoca, and opted to continued aggressive care.   The patient is on 3L oxygen at baseline, he presents with progressive dyspnea. Review of labs unremarkable other then leukocytosis. Review of imaging shows moderate right pleural effusion with some compressive atelectasis and left sided radiation changes. Right sided cancer with compression of the right bronchus intermedius.   On speaking with the patient he has loud stridor, even audible outside of the room, and he is using his belly muscles to breathe even while on bipap. He and his wife note that he has been having progressive dyspnea over the past month, however this really advanced over the past 2 days.  Wife notes that he is on 3L at home, and over the past week he was getting very winded with minor activity, his oxygen would drop into the 80's even with minor activity such  as brushing his teeth.    REVIEW OF SYSTEMS:   Could not obtain due to dyspnea.    VITAL SIGNS: Temp:  [98 F (36.7 C)-98.5 F (36.9 C)] 98.5 F (36.9 C) (12/01 1600) Pulse Rate:  [90-145] 101 (12/01 1800) Resp:  [12-22] 12 (12/01 1800) BP: (88-175)/(56-113) 92/62 (12/01 1800) SpO2:  [93 %-97 %] 94 % (12/01 1800) FiO2 (%):  [35 %] 35 % (12/01 1611) Weight:  [226 lb 3.1 oz (102.6 kg)] 226 lb 3.1 oz (102.6 kg) (12/01 1920) HEMODYNAMICS:   VENTILATOR SETTINGS: Vent Mode: PRVC FiO2 (%):  [35 %] 35 % Set Rate:  [15 bmp] 15 bmp Vt Set:  [420 mL] 420 mL PEEP:  [5 cmH20] 5 cmH20 INTAKE / OUTPUT:  Intake/Output Summary (Last 24 hours) at 11/08/16 0804 Last data filed at 2016-11-15 1800  Gross per 24 hour  Intake          1361.07 ml  Output              900 ml  Net           461.07 ml    Physical Examination:   VS: BP 92/62   Pulse (!) 101   Temp 98.5 F (36.9 C) (Axillary)   Resp 12   Ht '5\' 9"'$  (1.753 m)   Wt 226 lb 3.1 oz (102.6 kg)   SpO2 94%   BMI 33.40 kg/m   General Appearance: +distress, on vent Neuro:GCS<8T HEENT: PERRLA, EOM intact, no ptosis. Pulmonary: Decreased air entry in RLL, diaphragmatic excursion normal. CardiovascularNormal S1,S2.  No m/r/g.  Abdomen: Benign, Soft, non-tender, No masses, hepatosplenomegaly, No lymphadenopathy Renal:  No costovertebral tenderness  GU:  Not performed at this time. Endoc: No evident thyromegaly, no signs of acromegaly. Skin:   warm, no rashes, no ecchymosis  Extremities: normal, no cyanosis, clubbing, no edema, warm with normal capillary refill.    LABS: Reviewed   LABORATORY PANEL:   CBC  Recent Labs Lab 11/14/16 0104  WBC 8.1  HGB 9.3*  HCT 28.0*  PLT 264    Chemistries   Recent Labs Lab 11/04/16 0513  11/05/16 0553 2016-11-14 0104  NA  --   < > 130* 135  K  --   < > 4.0 4.9  CL  --   < > 101 105  CO2  --   < > 23 27  GLUCOSE  --   < > 203* 142*  BUN  --   < > 27* 32*  CREATININE  --   <  > 0.87 0.68  CALCIUM  --   < > 8.9 8.9  MG 1.8  < > 1.9  --   PHOS 2.5  --   --   --   < > = values in this interval not displayed.   Recent Labs Lab 11/06/16 1947 11/14/2016 0009 2016/11/14 0411 11-14-16 0723 2016/11/14 1141 November 14, 2016 1536  GLUCAP 149* 121* 144* 135* 140* 126*    Recent Labs Lab 11/03/16 0153 11/03/16 0423  PHART 7.35 7.33*  PCO2ART 42 38  PO2ART 90 92   No results for input(s): AST, ALT, ALKPHOS, BILITOT, ALBUMIN in the last 168 hours.  Cardiac Enzymes  Recent Labs Lab 10/26/2016 2215  TROPONINI <0.03    RADIOLOGY:  No results found.  Discharge Summary Patient was admitted with acute respiratory failure and intubated. He was maintained on full vent support. He had a thoracentesis for right pleural effusion by IR and 1L of amber fluid was drained. He developed aflutter and was started on amiodarone infusion. He was placed on antibiotics for pos-obstructive pneumonitis. Despite treatment, he continued to decline medically. Given his complex medical history and poor prognosis, family decided on a one-way extubation. Patient was made comfort care and expired at [place]  Discharge plan Patient expired at 1905 on November 14, 2016  Kelvin Burpee S. [facility] ANP-BC Pulmonary and Critical Care Medicine [facility] Pager [phone] or [phone]

## 2016-12-08 NOTE — Progress Notes (Signed)
ALL THE IVS HAVE BEEN REMOVED. PATIENT WAS BAGGED AND TAGGED AT AROUND 2100. [facility] CALLED.

## 2016-12-08 NOTE — Progress Notes (Signed)
Chaplain rounded the unit to provide a compassionate presence and support to the patient. Patient appeared to be sleeping.  Chaplain and family engaged in prayer at the bedside. Minerva Fester [phone]

## 2016-12-08 DEATH — deceased

## 2016-12-09 ENCOUNTER — Ambulatory Visit: Payer: Self-pay | Admitting: Oncology

## 2017-11-16 IMAGING — DX DG CHEST 1V
2 series · 2 of 2 positions shown · non-contrast
Comparison: 11/02/2016

CLINICAL DATA: Endotracheal tube placement

EXAM:
CHEST 1 VIEW

[chest ap (1 of 2)]
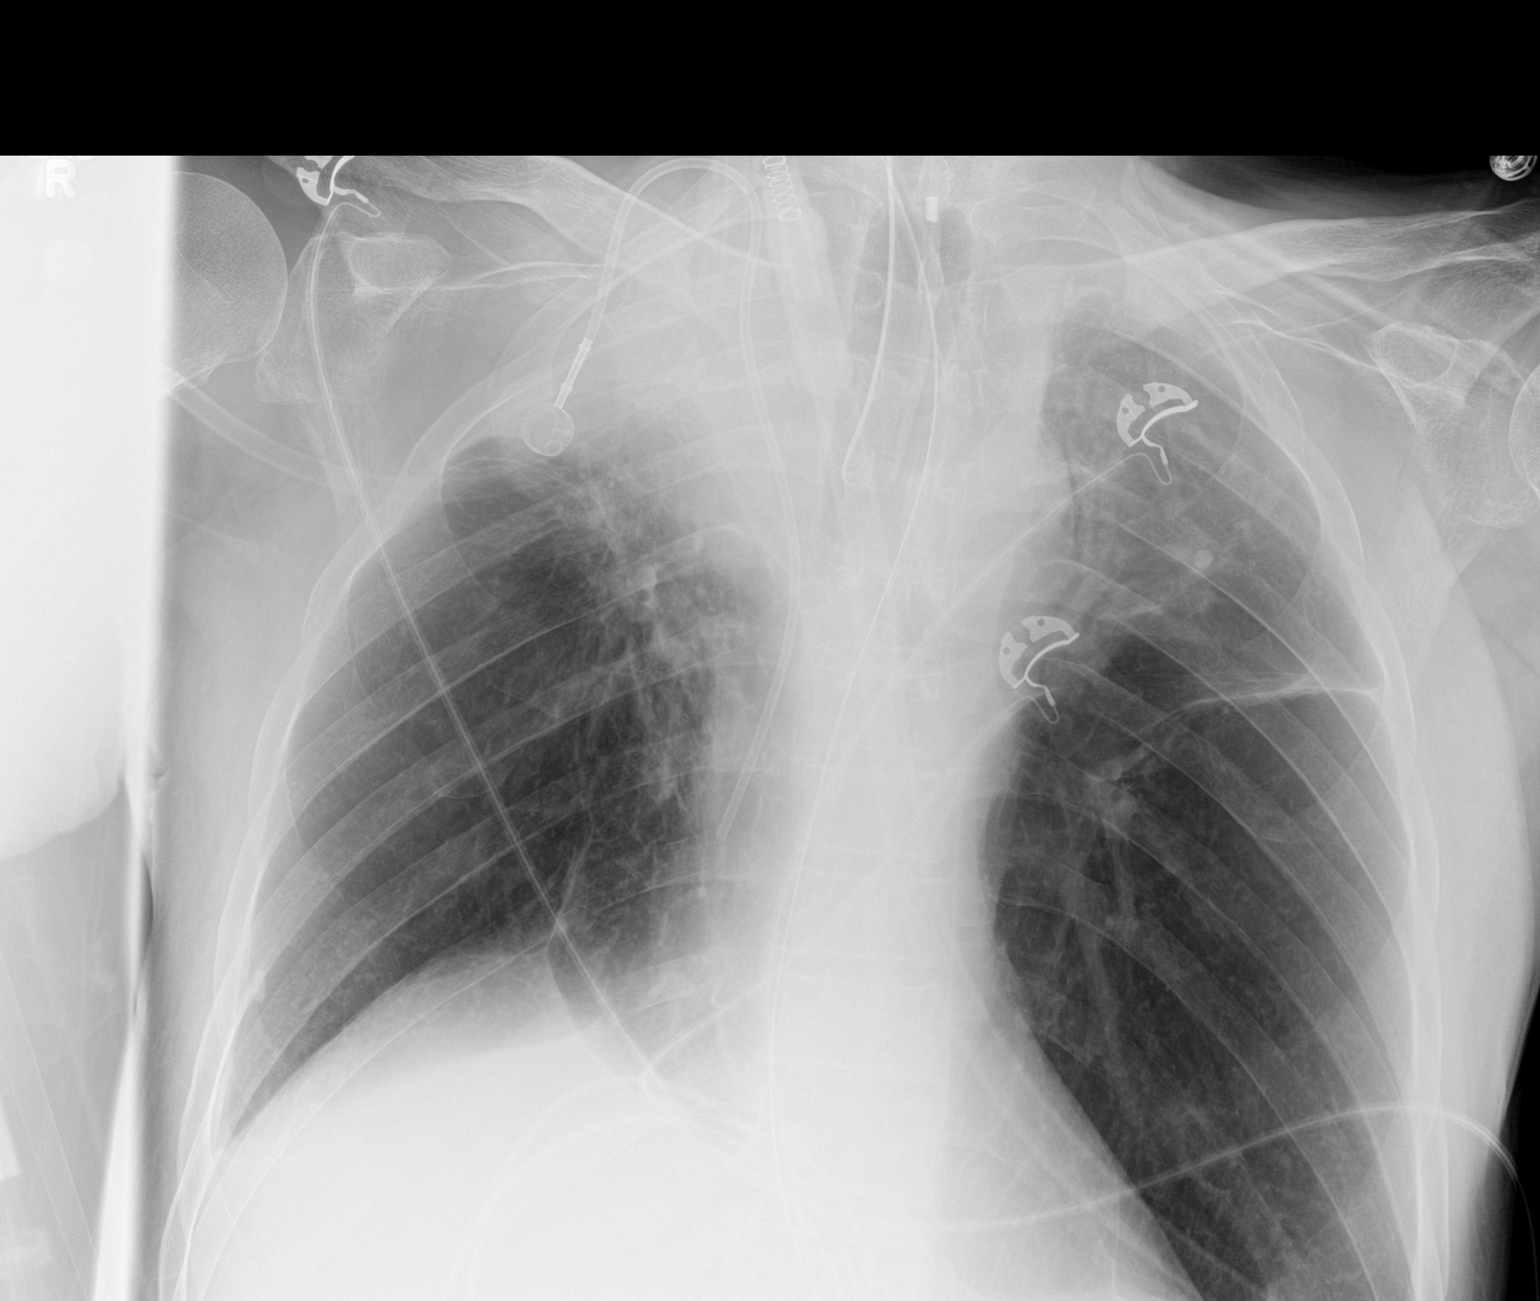

[chest ap (2 of 2)]
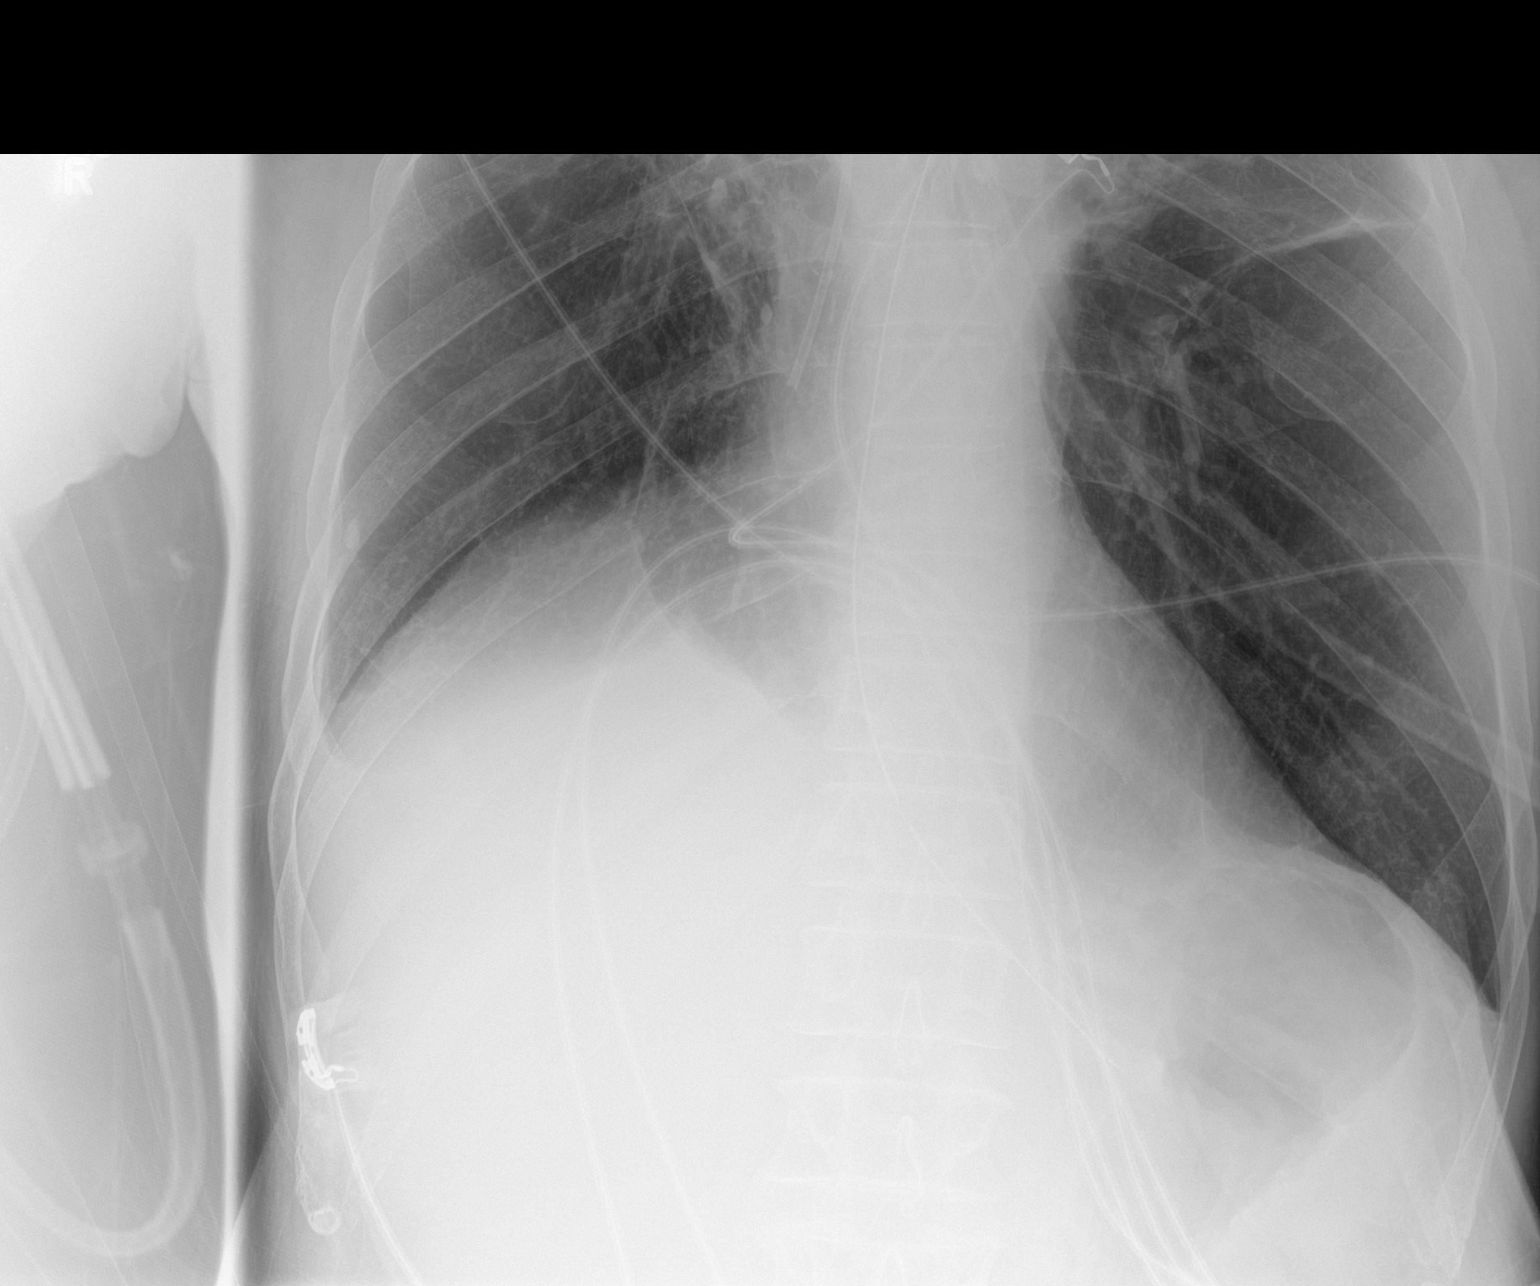

[2 of 2 positions shown; findings below may reference images not displayed]

FINDINGS: An endotracheal tube was placed with tip measuring 4.1 cm above the
carina. Enteric tube tip is localized in the left upper quadrant
consistent with location in the upper stomach. Proximal side hole is
near the location of the EG junction. Right central venous catheter
with tip over the low SVC region. No pneumothorax. Right upper lobe
collapse and consolidation with less prominent left upper lobe
collapse and consolidation, progressing since previous study. Linear
scarring in the left mid lung. Small right pleural effusion. Normal
heart size.
IMPRESSION: Appliances positioned as described with enteric tube tip in the left
upper quadrant but proximal side hole near the EG junction.
Endotracheal tube is in satisfactory position. Progressive bilateral
upper lobe collapse and consolidation.

## 2017-11-16 IMAGING — DX DG ABD PORTABLE 1V
1 series · 1 of 1 positions shown · non-contrast
Comparison: KUB November 03, 2016 upper earlier today.

CLINICAL DATA: NG tube placement. History of lung malignancy and
gastroesophageal reflux and respiratory failure.

EXAM:
PORTABLE ABDOMEN - 1 VIEW

[abdomen kub]
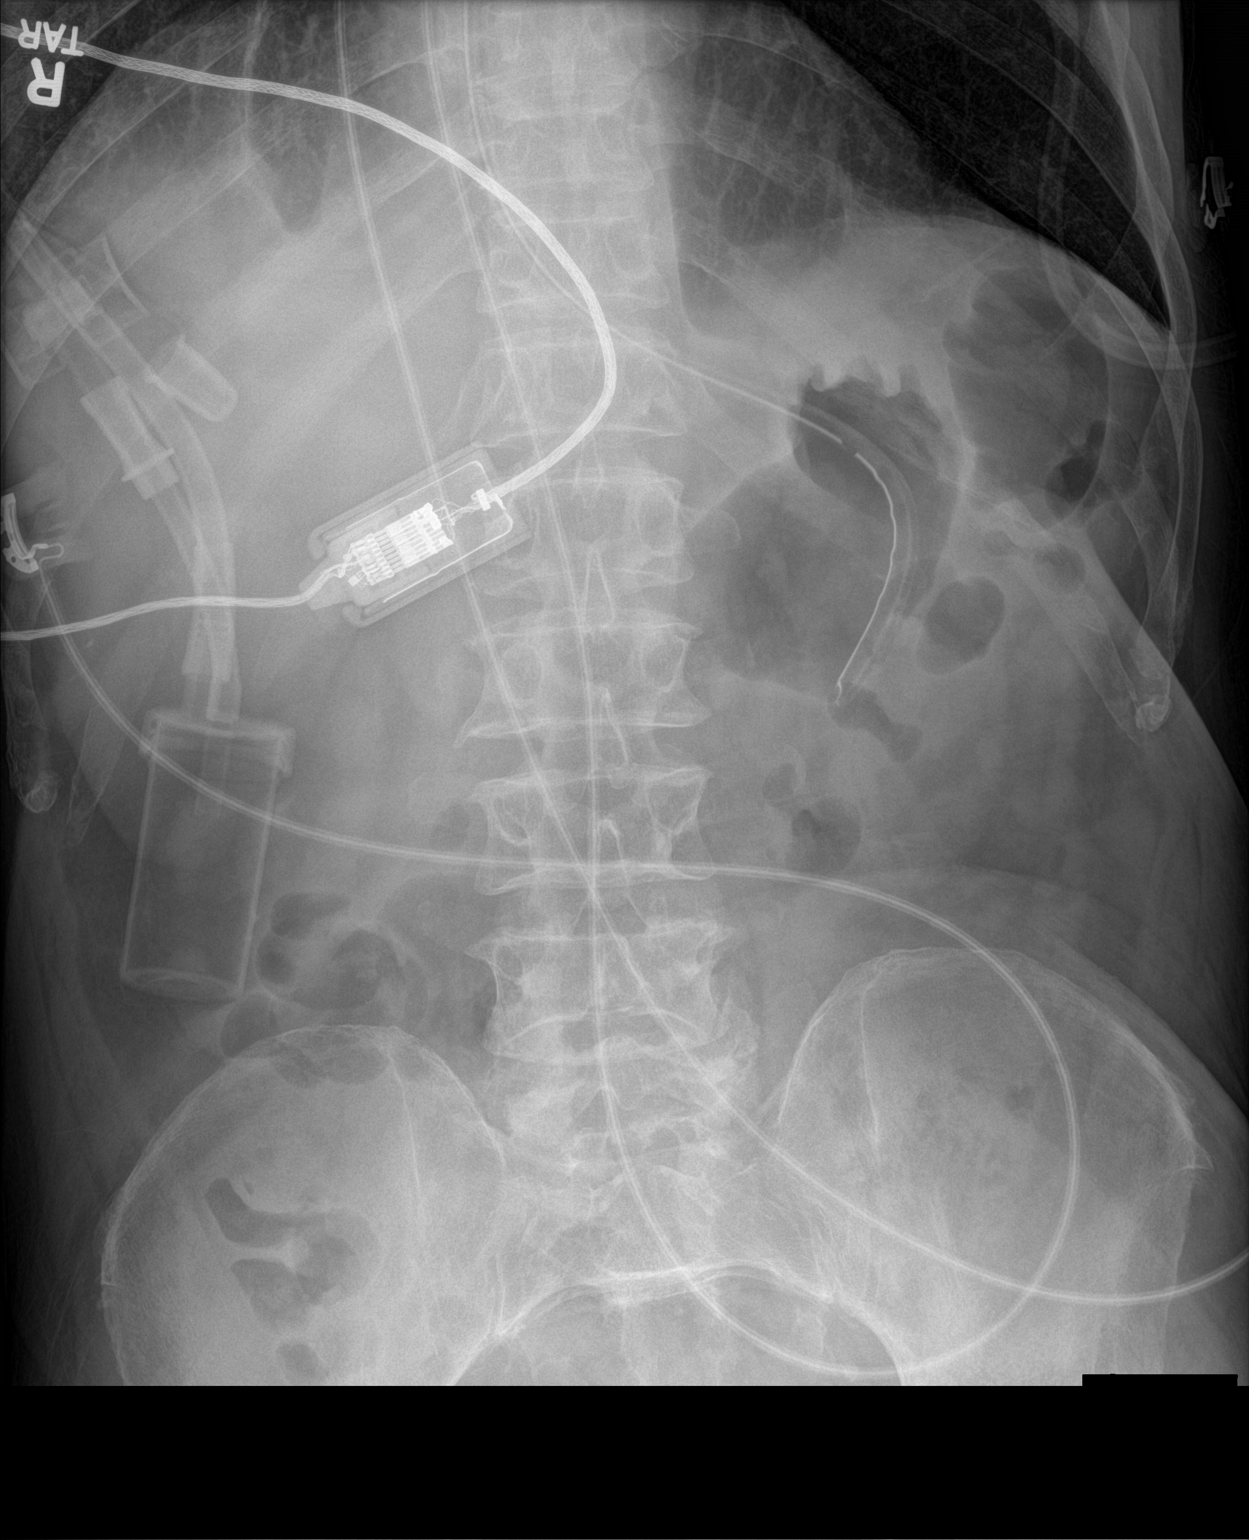

[1 of 1 positions shown; findings below may reference images not displayed]

FINDINGS: The NG tube has been advanced such that the proximal port now all
lies just below the GE junction and the tip in the mid gastric body.
The bowel gas pattern is unremarkable. There is increased density at
the right lung base which is stable.
IMPRESSION: Interval advancement of the nasogastric tube by approximately 5 DIS
7 cm hest both the proximal port and the tip in the gastric body.
Advancement by an additional 5 cm would be useful to assure that the
proximal port does remain below the GE junction.

## 2017-11-16 IMAGING — DX DG ABDOMEN 1V
1 series · 1 of 1 positions shown · non-contrast
Comparison: None.

CLINICAL DATA: NG placement

EXAM:
ABDOMEN - 1 VIEW

[abdomen kub]
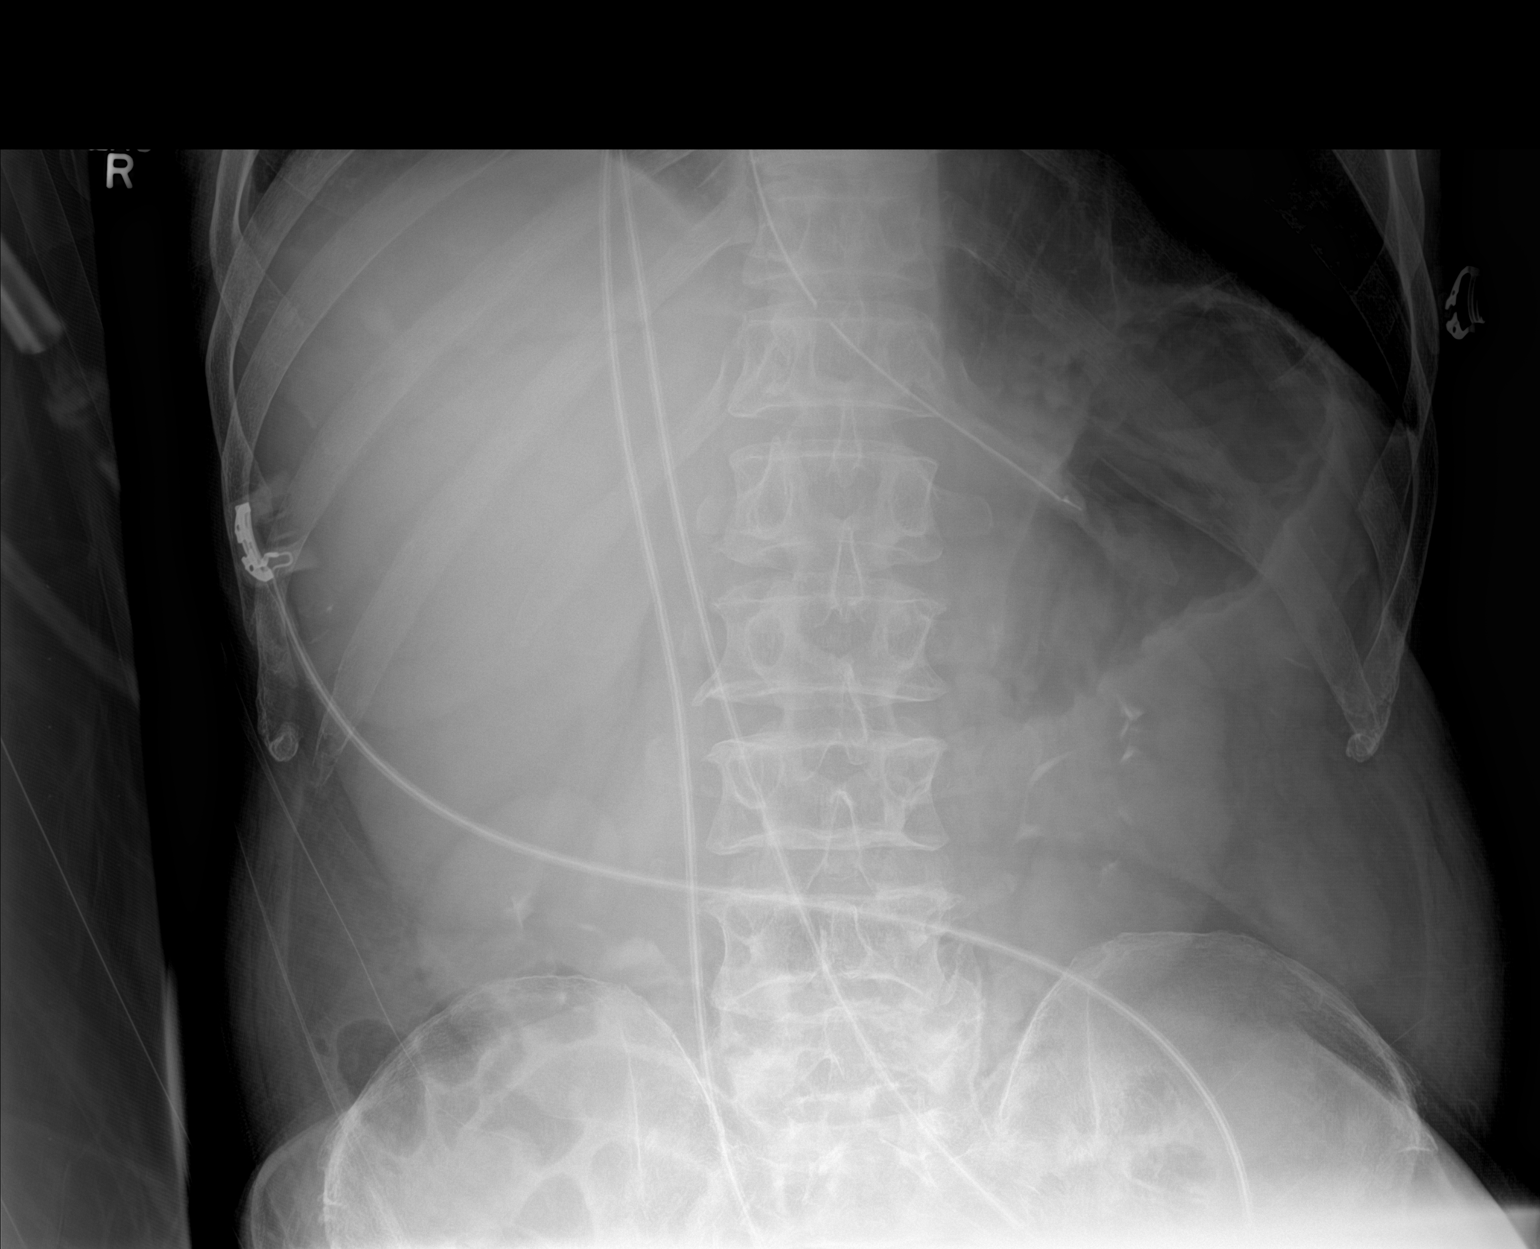

[1 of 1 positions shown; findings below may reference images not displayed]

FINDINGS: Enteric tube is present with tip in the left upper quadrant
consistent with location in the upper stomach. Proximal side hole is
projected over the expected location of the EG junction and may be
in the lower esophagus. Consider advancement. Elevation of the right
hemidiaphragm with small right pleural effusion. Residual contrast
material in the urinary tract.
IMPRESSION: Enteric tube tip is in the left upper quadrant the proximal side
hole projects over the expected location of the EG junction,
possibly in the lower esophagus. Consider advancing the tube for
better placement.

## 2017-11-16 IMAGING — DX DG ABD PORTABLE 1V
2 series · 2 of 2 positions shown · non-contrast
Comparison: 11/03/2016 [DATE] a.m..

CLINICAL DATA: 70-year-old male with nasogastric tube placement.
Subsequent encounter.

EXAM:
PORTABLE ABDOMEN - 1 VIEW

[abdomen kub (1 of 2)]
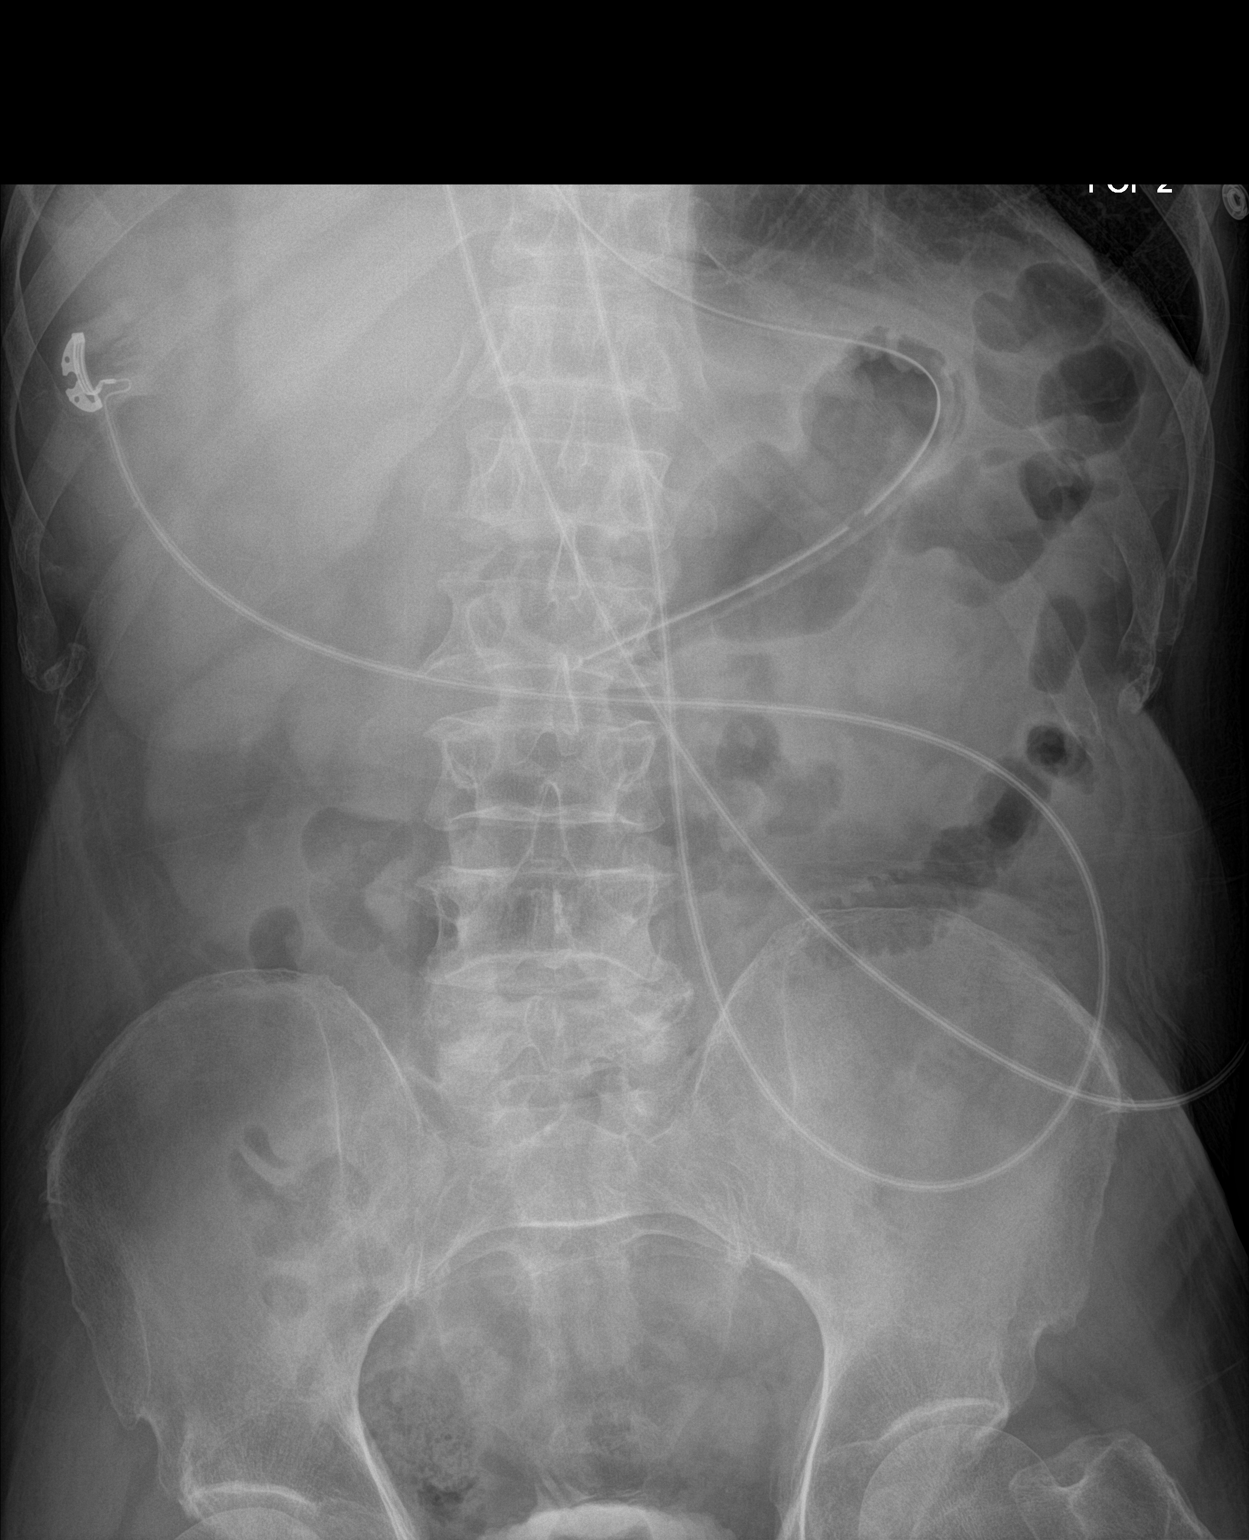

[abdomen kub (2 of 2)]
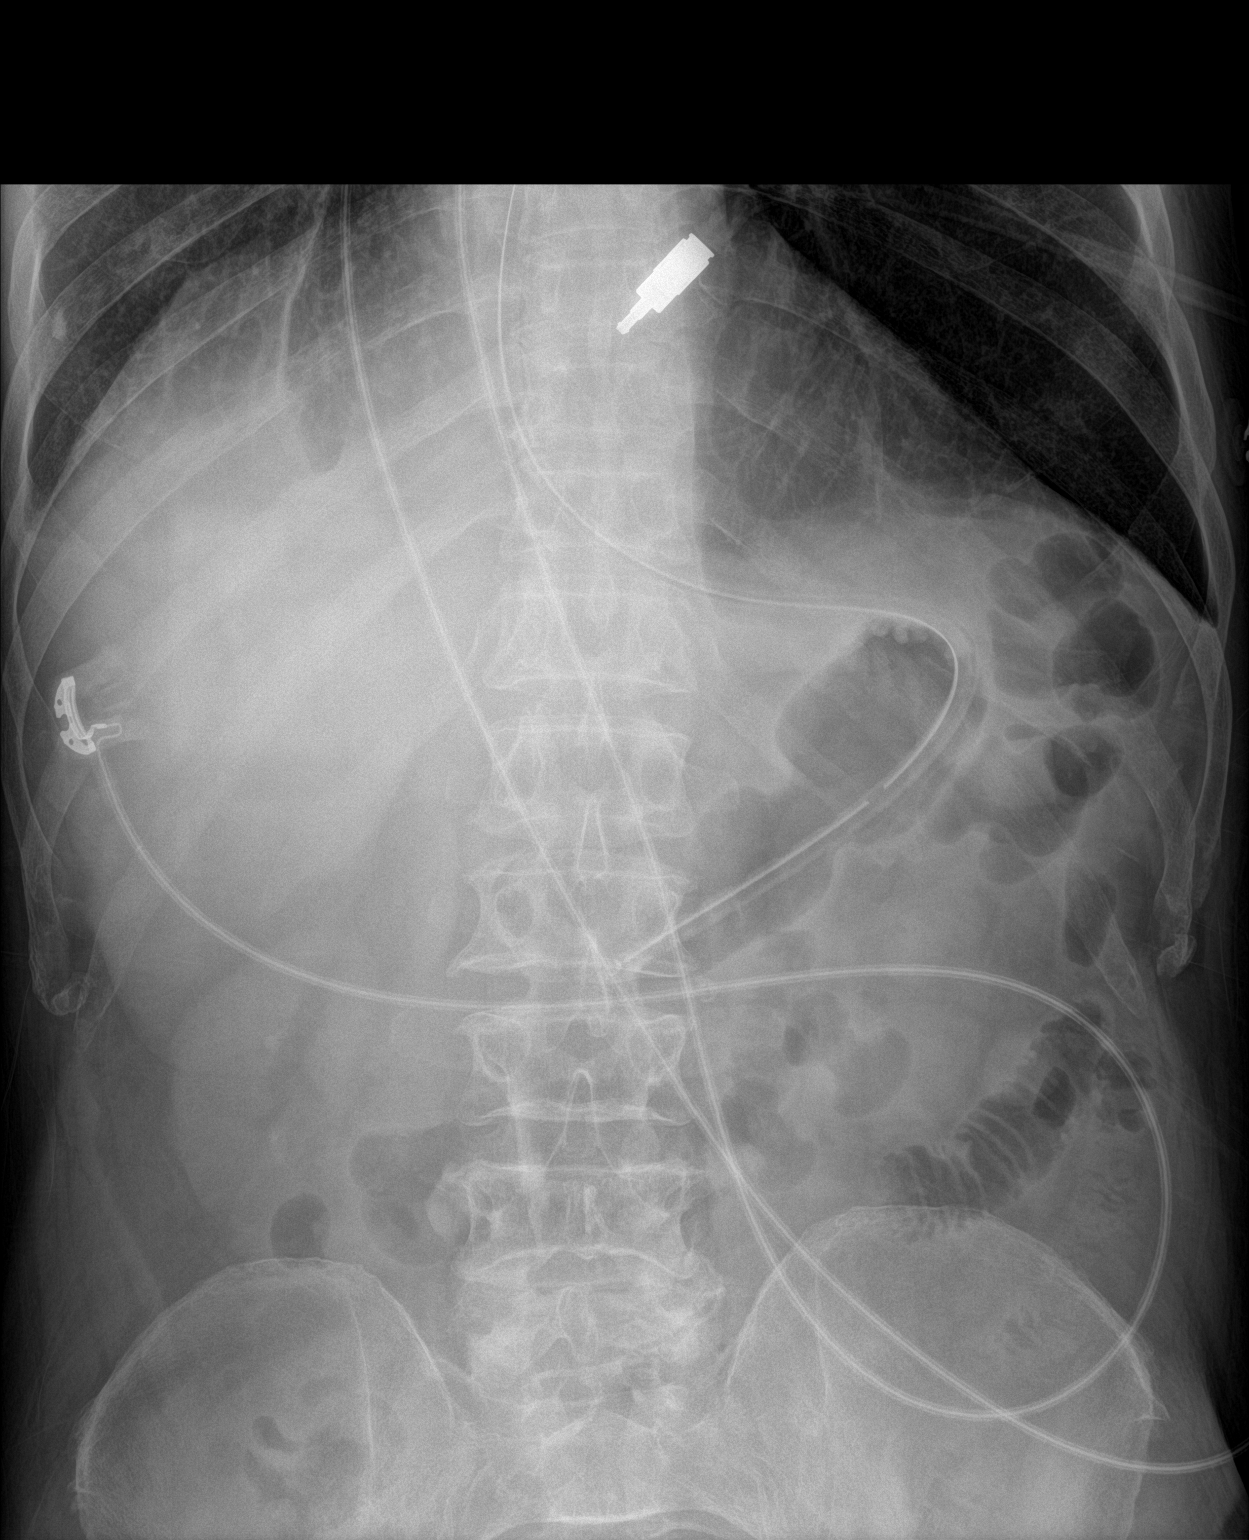

[2 of 2 positions shown; findings below may reference images not displayed]

FINDINGS: Nasogastric tube has been advanced. The tip is at the level of the
body/antrum junction.

Bowel gas pattern unremarkable.

Residual contrast within the bladder from recent chest CT. Foley
catheter appears in place.
IMPRESSION: Nasogastric tube has been advanced. The tip is at the level of the
body/antrum junction level. Side hole fundus-body junction level.
